# Patient Record
Sex: Female | Born: 1935 | Race: White | Hispanic: No | Marital: Married | Smoking: Current every day smoker
Health system: Southern US, Community
[De-identification: ages and names within clinical notes are randomized; demographics above are authoritative.]

## PROBLEM LIST (undated history)

## (undated) DIAGNOSIS — C8330 Diffuse large B-cell lymphoma, unspecified site: Secondary | ICD-10-CM

## (undated) DIAGNOSIS — C8290 Follicular lymphoma, unspecified, unspecified site: Secondary | ICD-10-CM

## (undated) DIAGNOSIS — B019 Varicella without complication: Secondary | ICD-10-CM

## (undated) DIAGNOSIS — C7931 Secondary malignant neoplasm of brain: Secondary | ICD-10-CM

## (undated) DIAGNOSIS — Z973 Presence of spectacles and contact lenses: Secondary | ICD-10-CM

## (undated) DIAGNOSIS — C4490 Unspecified malignant neoplasm of skin, unspecified: Secondary | ICD-10-CM

## (undated) DIAGNOSIS — C8370 Burkitt lymphoma, unspecified site: Secondary | ICD-10-CM

## (undated) DIAGNOSIS — Z972 Presence of dental prosthetic device (complete) (partial): Secondary | ICD-10-CM

## (undated) DIAGNOSIS — M549 Dorsalgia, unspecified: Secondary | ICD-10-CM

## (undated) DIAGNOSIS — R110 Nausea: Secondary | ICD-10-CM

## (undated) HISTORY — DX: Diffuse large B-cell lymphoma, unspecified site: C83.30

## (undated) HISTORY — PX: APPENDECTOMY: SHX54

## (undated) HISTORY — DX: Follicular lymphoma, unspecified, unspecified site: C82.90

## (undated) HISTORY — DX: Nausea: R11.0

## (undated) HISTORY — DX: Varicella without complication: B01.9

## (undated) HISTORY — PX: TONSILLECTOMY: SUR1361

## (undated) HISTORY — DX: Dorsalgia, unspecified: M54.9

## (undated) HISTORY — DX: Secondary malignant neoplasm of brain: C79.31

---

## 2009-07-18 ENCOUNTER — Ambulatory Visit: Payer: Self-pay | Admitting: Family Medicine

## 2009-07-18 DIAGNOSIS — L02419 Cutaneous abscess of limb, unspecified: Secondary | ICD-10-CM

## 2009-07-18 DIAGNOSIS — L03119 Cellulitis of unspecified part of limb: Secondary | ICD-10-CM

## 2009-07-25 ENCOUNTER — Ambulatory Visit: Payer: Self-pay | Admitting: Family Medicine

## 2009-07-25 DIAGNOSIS — I10 Essential (primary) hypertension: Secondary | ICD-10-CM | POA: Insufficient documentation

## 2009-08-04 ENCOUNTER — Telehealth: Payer: Self-pay | Admitting: Family Medicine

## 2009-08-05 ENCOUNTER — Ambulatory Visit: Payer: Self-pay | Admitting: Family Medicine

## 2009-08-22 ENCOUNTER — Ambulatory Visit: Payer: Self-pay | Admitting: Family Medicine

## 2009-08-22 ENCOUNTER — Ambulatory Visit: Payer: Self-pay

## 2009-08-22 DIAGNOSIS — M79609 Pain in unspecified limb: Secondary | ICD-10-CM

## 2009-08-22 DIAGNOSIS — R609 Edema, unspecified: Secondary | ICD-10-CM

## 2009-09-03 ENCOUNTER — Ambulatory Visit: Payer: Self-pay | Admitting: Family Medicine

## 2010-03-18 ENCOUNTER — Ambulatory Visit: Payer: Self-pay | Admitting: Family Medicine

## 2010-07-09 ENCOUNTER — Ambulatory Visit: Admit: 2010-07-09 | Payer: Self-pay | Admitting: Family Medicine

## 2010-07-28 NOTE — Assessment & Plan Note (Signed)
Summary: 1 WK ROV/NJR   Vital Signs:  Patient profile:   76 year old female Menstrual status:  postmenopausal Temp:     97.5 degrees F oral BP sitting:   158 / 72  (left arm) Cuff size:   regular  Vitals Entered By: Sid Falcon LPN (July 25, 2009 10:04 AM) CC: 1 week left leg cellulitis Is Patient Diabetic? No   History of Present Illness: Here for the following factors.  Cellulitis left leg.  started Keflex one week ago. Overall improved. No fever or chills. Still has some edema but less redness and somewhat less pain. No prior history of cellulitis. No history of clear injury. No side effects from medication  Elevated blood pressure. No history of hypertension treatment. She recalls blood pressure readings 140-150 systolic prior to moving here. No recent headaches. Denies any palpitations, dizziness, or chest pains. No regular alcohol use. No recent nonsteroidal use.  Preventive Screening-Counseling & Management  Alcohol-Tobacco     Smoking Status: quit     Year Started: 1978     Year Quit: 1998  Allergies: 1)  Penicillin V Potassium (Penicillin V Potassium)  Past History:  Past Surgical History: Last updated: 07/18/2009 Appendectomy, age 80 Tonsillectomy, age 21  Social History: Last updated: 07/18/2009 Retired Married Alcohol use-yes Regular exercise-yes Smoked 20 years, quit 1998 2 pregnancies, 2 live births  Social History: Smoking Status:  quit  Review of Systems  The patient denies anorexia, fever, weight loss, weight gain, chest pain, syncope, dyspnea on exertion, prolonged cough, and headaches.    Physical Exam  General:  Well-developed,well-nourished,in no acute distress; alert,appropriate and cooperative throughout examination Neck:  No deformities, masses, or tenderness noted. Lungs:  Normal respiratory effort, chest expands symmetrically. Lungs are clear to auscultation, no crackles or wheezes. Heart:  normal rate, regular rhythm, and no  murmur.   Extremities:  patient still some swelling left lower extremity compared to right and some mild erythema of the distal leg but less tenderness.  Erythema clearly improved c/w last week.   Impression & Recommendations:  Problem # 1:  CELLULITIS, LEG, LEFT (ICD-682.6) Assessment Improved overall improving. Continue heat elevation. Her updated medication list for this problem includes:    Cephalexin 500 Mg Caps (Cephalexin) ..... One by mouth three times a day for 10 days  Problem # 2:  ESSENTIAL HYPERTENSION (ICD-401.9)  patient has had at least 3 readings over 150s systolic. Initiate lisinopril 10 mg daily and followup one month to reassess  Her updated medication list for this problem includes:    Lisinopril 10 Mg Tabs (Lisinopril) ..... One by mouth once daily  Complete Medication List: 1)  Cephalexin 500 Mg Caps (Cephalexin) .... One by mouth three times a day for 10 days 2)  Lisinopril 10 Mg Tabs (Lisinopril) .... One by mouth once daily  Patient Instructions: 1)  Continue frequent elevation of the leg. Continue heating pad several times daily. Call by next week if you've not had further improvement 2)  Please schedule a follow-up appointment in 1 month.  3)  Limit your Sodium(salt) .  Prescriptions: LISINOPRIL 10 MG TABS (LISINOPRIL) one by mouth once daily  #30 x 5   Entered and Authorized by:   Evelena Peat MD   Signed by:   Evelena Peat MD on 07/25/2009   Method used:   [facility] to        [facility]  [address]  [phone]* (retail)       [address] [place]  [place], [place]  [address]       Ph: [phone] or [phone]       Fax: [phone]   RxID:   [id number]

## 2010-07-28 NOTE — Assessment & Plan Note (Signed)
Summary: F/U ON LEG PAIN // RS   Vital Signs:  Patient profile:   76 year old female Menstrual status:  postmenopausal Temp:     98.5 degrees F oral BP sitting:   120 / 68  (left arm) Cuff size:   regular  Vitals Entered By: Sid Falcon LPN (September 03, 1608 10:04 AM) CC: follow-up on leg   History of Present Illness: Followup left lower leg edema. Recently treated for cellulitis. Venous Doppler revealed no evidence for DVT. Last antibiotic was Levaquin and patient did notice some decrease in redness. Denies any fever or chills. Less tender. Still swelling moderately. She has gotten some compression hose that she plans to wear.  Hypertension treated with lisinopril HCTZ. Blood pressure well controlled. Needs refill through mail order.  Allergies: 1)  Penicillin V Potassium (Penicillin V Potassium)  Past History:  Past Medical History: Last updated: 08/05/2009 Chicken pox Hypertension PMH reviewed for relevance  Review of Systems      See HPI  Physical Exam  General:  Well-developed,well-nourished,in no acute distress; alert,appropriate and cooperative throughout examination Lungs:  Normal respiratory effort, chest expands symmetrically. Lungs are clear to auscultation, no crackles or wheezes. Heart:  Normal rate and regular rhythm. S1 and S2 normal without gallop, murmur, click, rub or other extra sounds. Extremities:  left leg reveals some continued edema which is essentially unchanged. Less erythematous. Nontender. Good distal foot pulses. Excellent capillary refill.   Impression & Recommendations:  Problem # 1:  CELLULITIS, LEG, LEFT (ICD-682.6) Assessment Improved agree with compression and she may need prescription compression garments. The following medications were removed from the medication list:    Levaquin 500 Mg Tabs (Levofloxacin) ..... One by mouth once daily for 10 days  Complete Medication List: 1)  Lisinopril-hydrochlorothiazide 10-12.5 Mg Tabs  (Lisinopril-hydrochlorothiazide) .... One by mouth  once daily  Patient Instructions: 1)  followup immediately if you develop any fever or chills or worsening redness of the leg 2)  Please schedule a follow-up appointment in 6 months .  Prescriptions: LISINOPRIL-HYDROCHLOROTHIAZIDE 10-12.5 MG TABS (LISINOPRIL-HYDROCHLOROTHIAZIDE) one by mouth  once daily  #90 x 3   Entered and Authorized by:   Evelena Peat MD   Signed by:   Evelena Peat MD on 09/03/2009   Method used:   Faxed to ...       [facility] (mail-order)             , [place]         Ph: [phone]       Fax: [phone]   RxID:   [id number]

## 2010-07-28 NOTE — Assessment & Plan Note (Signed)
Summary: 6 MTH ROV // RS   Vital Signs:  Patient profile:   76 year old female Menstrual status:  postmenopausal Weight:      112 pounds Temp:     97.7 degrees F oral BP sitting:   120 / 62  (left arm) Cuff size:   regular  Vitals Entered By: Sid Falcon LPN (March 18, 2010 10:30 AM)  History of Present Illness: Patient seen for followup. She has hypertension.  chronic left lower extremity edema during the past year. Initially considered cellulitis but minimal if any improvement with antibiotics. Dopplers revealed no evidence for clot.  Uses support hose inconsistently.  Patient declines flu and Pneumovax.  Hypertension History:      She denies headache, chest pain, palpitations, dyspnea with exertion, orthopnea, PND, visual symptoms, neurologic problems, syncope, and side effects from treatment.  She notes no problems with any antihypertensive medication side effects.        Positive major cardiovascular risk factors include female age 81 years old or older and hypertension.  Negative major cardiovascular risk factors include non-tobacco-user status.     Allergies: 1)  Penicillin V Potassium (Penicillin V Potassium)  Past History:  Past Medical History: Last updated: 08/05/2009 Chicken pox Hypertension PMH reviewed for relevance  Physical Exam  General:  Well-developed,well-nourished,in no acute distress; alert,appropriate and cooperative throughout examination Mouth:  Oral mucosa and oropharynx without lesions or exudates.  Teeth in good repair. Neck:  No deformities, masses, or tenderness noted. Lungs:  Normal respiratory effort, chest expands symmetrically. Lungs are clear to auscultation, no crackles or wheezes. Heart:  Normal rate and regular rhythm. S1 and S2 normal without gallop, murmur, click, rub or other extra sounds. Extremities:  she has very mild erythema left lower leg with very minimal edema and essentially nontender. Palpable dorsalis pedis pulses  bilaterally   Impression & Recommendations:  Problem # 1:  LEG PAIN (ICD-729.5) ?venous stasis.  Problem # 2:  HYPERTENSION (ICD-401.9) Assessment: Unchanged  Her updated medication list for this problem includes:    Lisinopril-hydrochlorothiazide 10-12.5 Mg Tabs (Lisinopril-hydrochlorothiazide) ..... One by mouth  once daily  Problem # 3:  Preventive Health Care (ICD-V70.0) pt offered flu vaccine and declines.  Complete Medication List: 1)  Lisinopril-hydrochlorothiazide 10-12.5 Mg Tabs (Lisinopril-hydrochlorothiazide) .... One by mouth  once daily  Hypertension Assessment/Plan:      The patient's hypertensive risk group is category B: At least one risk factor (excluding diabetes) with no target organ damage.  Today's blood pressure is 120/62.    Patient Instructions: 1)  Consider scheduling medicare wellness exam.

## 2010-07-28 NOTE — Miscellaneous (Signed)
Summary: Orders Update  Clinical Lists Changes  Problems: Added new problem of LEG PAIN (ICD-729.5) Orders: Added new Test order of Venous Duplex Lower Extremity (Venous Duplex Lower) - Signed 

## 2010-07-28 NOTE — Assessment & Plan Note (Signed)
Summary: BRAND NEW PT/TO ESTABLISH/CJR/pt rescd-bad weather//ccm   Vital Signs:  Patient profile:   76 year old female Menstrual status:  postmenopausal Height:      97.6 inches Weight:      117 pounds BMI:     8.67 Temp:     97.6 degrees F oral Pulse rate:   72 / minute Pulse rhythm:   regular Resp:     12 per minute BP sitting:   150 / 78  (left arm) Cuff size:   regular  Vitals Entered By: Sid Falcon LPN (July 18, 2009 10:21 AM) CC: New to establish, new to area     Menstrual Status postmenopausal   History of Present Illness: Patient is seen to establish care.  No chronic medical problems. Takes no medications. Questionable history of penicillin allergy in childhood. No history of anaphylaxis. Previous appendectomy and tonsillectomy.  Family history unrevealing. Takes no medications.  Acute issue is approx 3-4 weeks of some swelling left lower extremity. She recalls hitting her ankle against a box with moving here and had some extreme redness and warmth in the ankle region which has persisted and seems to moving somewhat upper leg. No fevers or chills. No obvious breaks in skin. No thigh pain.    Preventive Screening-Counseling & Management  Alcohol-Tobacco     Year Started: 1978     Year Quit: 1998  Caffeine-Diet-Exercise     Does Patient Exercise: yes  Past History:  Family History: Last updated: 07/18/2009 unrevealing.  Social History: Last updated: 07/18/2009 Retired Married Alcohol use-yes Regular exercise-yes Smoked 20 years, quit 1998 2 pregnancies, 2 live births  Risk Factors: Exercise: yes (07/18/2009)  Past Medical History: Chicken pox  Past Surgical History: Appendectomy, age 30 Tonsillectomy, age 23  Family History: unrevealing.  Social History: Retired Married Alcohol use-yes Regular exercise-yes Smoked 20 years, quit 1998 2 pregnancies, 2 live births Does Patient Exercise:  yes  Review of Systems      See  HPI  Physical Exam  General:  Well-developed,well-nourished,in no acute distress; alert,appropriate and cooperative throughout examination Mouth:  Oral mucosa and oropharynx without lesions or exudates.  Teeth in good repair. Neck:  No deformities, masses, or tenderness noted. Lungs:  Normal respiratory effort, chest expands symmetrically. Lungs are clear to auscultation, no crackles or wheezes. Heart:  normal rate and regular rhythm.   Extremities:  patient has erythema and warmth to palpation left lower leg just above the ankle region. faint erythema spreading about half way up anterior shin. No obvious break in skin noted. No calf tenderness. No thigh tenderness.   Impression & Recommendations:  Problem # 1:  CELLULITIS, LEG, LEFT (ICD-682.6) Assessment New  Her updated medication list for this problem includes:    Cephalexin 500 Mg Caps (Cephalexin) ..... One by mouth three times a day for 10 days  Complete Medication List: 1)  Cephalexin 500 Mg Caps (Cephalexin) .... One by mouth three times a day for 10 days  Patient Instructions: 1)  Schedule followup appointment in one week. 2)  Elevate legs frequently. 3)  Use heating pad several times daily to left calf and ankle region Prescriptions: CEPHALEXIN 500 MG CAPS (CEPHALEXIN) one by mouth three times a day for 10 days  #30 x 0   Entered and Authorized by:   Evelena Peat MD   Signed by:   Evelena Peat MD on 07/18/2009   Method used:   [facility]        [facility]  [address]  [address] (retail)       [address] [place], [place]  [address]       Ph: [phone] or [phone]       Fax: [phone]   RxID:   [id number]

## 2010-07-28 NOTE — Progress Notes (Signed)
Summary: leg pain  Phone Note Call from Patient   Caller: Patient Call For: Colleen Peat MD Summary of Call: Pt calls to let Dr. Caryl Never know that her leg is no better.   [phone] Initial call taken by: Lynann Beaver CMA,  August 04, 2009 11:34 AM  Follow-up for Phone Call        Pt needs f/u to reassess BP anyway so I would get her in over the next day or two to reassess leg. Follow-up by: Colleen Peat MD,  August 04, 2009 12:59 PM  Additional Follow-up for Phone Call Additional follow up Details #1::        Pt. scheduled with Dr. Caryl Never tomorrow. Additional Follow-up by: Lynann Beaver CMA,  August 04, 2009 2:15 PM

## 2010-07-28 NOTE — Assessment & Plan Note (Signed)
Summary: 1 month rov/njr   Vital Signs:  Patient profile:   76 year old female Menstrual status:  postmenopausal Weight:      116.50 pounds Temp:     98.2 degrees F oral BP sitting:   120 / 62  (left arm) Cuff size:   regular  Vitals Entered By: Sid Falcon LPN (August 22, 2009 10:51 AM) CC: 1 month follow-up   History of Present Illness: Patient has persistent left lower any swelling. She presented with typical cellulitis type symptoms of warmth and erythema and some swelling of the left lower extremity one month ago. She did have some improvement initially with Keflex and she has now completed 2 courses of Keflex without resolution of the edema. Redness is slightly improved. Has never had any systemic fever or chills. No history of thrombosis.  ex-smoker.  Allergies: 1)  Penicillin V Potassium (Penicillin V Potassium)  Past History:  Past Medical History: Last updated: 08/05/2009 Chicken pox Hypertension  Social History: Last updated: 07/18/2009 Retired Married Alcohol use-yes Regular exercise-yes Smoked 20 years, quit 1998 2 pregnancies, 2 live births PMH reviewed for relevance, PSH reviewed for relevance  Review of Systems  The patient denies chest pain, syncope, dyspnea on exertion, prolonged cough, and hemoptysis.    Physical Exam  General:  Well-developed,well-nourished,in no acute distress; alert,appropriate and cooperative throughout examination Lungs:  Normal respiratory effort, chest expands symmetrically. Lungs are clear to auscultation, no crackles or wheezes. Heart:  normal rate and regular rhythm.   Extremities:  left lower extremity reveals persistent swelling from the knee down to the ankle region. She has some erythema mostly the lower third lateral leg. Slight warmth. Minimal calf tenderness. No thigh tenderness. Good distal foot pulses.   Impression & Recommendations:  Problem # 1:  LEG EDEMA (ICD-782.3)  this is been treated for  cellulitis without resolution. Obtain Doppler to rule out evidence for DVT. If negative treat with quinolone with Levaquin 500 milligrams daily for 10 days Her updated medication list for this problem includes:    Lisinopril-hydrochlorothiazide 10-12.5 Mg Tabs (Lisinopril-hydrochlorothiazide) ..... One by mouth  once daily  Orders: Doppler Referral (Doppler)  Complete Medication List: 1)  Lisinopril-hydrochlorothiazide 10-12.5 Mg Tabs (Lisinopril-hydrochlorothiazide) .... One by mouth  once daily 2)  Levaquin 500 Mg Tabs (Levofloxacin) .... One by mouth once daily for 10 days Prescriptions: LEVAQUIN 500 MG TABS (LEVOFLOXACIN) one by mouth once daily for 10 days  #10 x 0   Entered and Authorized by:   Evelena Peat MD   Signed by:   Evelena Peat MD on 08/22/2009   Method used:   Print then Give to Patient   RxID:   [id number]   Appended Document: 1 month rov/njr This pt needs office f/u in 2 weeks to reassess leg.  Doppler of leg revealed no DVT.  Appended Document: 1 month rov/njr LMTCB  Appended Document: 1 month rov/njr Called home phone again today, was told wrong number.  Sent a letter to pt home indicating Dr Caryl Never wanted to re-evaluate her leg in 2 weeks, neg DVT, and update phone number.

## 2010-07-28 NOTE — Assessment & Plan Note (Signed)
Summary: recheck leg and BP check/dm   Vital Signs:  Patient profile:   76 year old female Menstrual status:  postmenopausal Temp:     98.7 degrees F oral BP sitting:   160 / 74  (left arm)  Vitals Entered By: Sid Falcon LPN (August 05, 2009 9:18 AM) CC: BP check, F/U cellulitis left leg   History of Present Illness: Here for follow up cellulitis Left Lower Leg and Hypertension. Recent Improvement with Keflex but about 5 Days after Stopping Antibiotics She Had Recurrence of Some Pain Redness and Warmth Left Leg. Allergy Penicillin. On Lisinopril for Hypertension Which she Just Recently Initiated. No Cough or Other Side Effect. Blood Pressure Still Ranging from 150 Systolic by Home Machine.  Allergies: 1)  Penicillin V Potassium (Penicillin V Potassium)  Past History:  Past Medical History: Chicken pox Hypertension PMH reviewed for relevance  Review of Systems      See HPI  Physical Exam  General:  Well-developed,well-nourished,in no acute distress; alert,appropriate and cooperative throughout examination Mouth:  Oral mucosa and oropharynx without lesions or exudates.  Teeth in good repair. Neck:  No deformities, masses, or tenderness noted. Lungs:  Normal respiratory effort, chest expands symmetrically. Lungs are clear to auscultation, no crackles or wheezes. Heart:  normal rate and regular rhythm.   Extremities:   recurrence of edema left lower leg with some erythema and slight warmth and tenderness involving the distal third of the leg. Minimal trace edema right lower extremity. Left distal foot pulses normal. Good capillary refill.  no calf tenderness.   Impression & Recommendations:  Problem # 1:  CELLULITIS, LEG, LEFT (ICD-682.6) recurrence after stopping antibiotic. Start back longer course of Keflex since this seemed to be working previously Her updated medication list for this problem includes:    Cephalexin 500 Mg Caps (Cephalexin) ..... One by mouth three  times a day for 10 days  Problem # 2:  ESSENTIAL HYPERTENSION (ICD-401.9) Assessment: Deteriorated change to lisinopril HCTZ Her updated medication list for this problem includes:    Lisinopril-hydrochlorothiazide 10-12.5 Mg Tabs (Lisinopril-hydrochlorothiazide) ..... One by mouth  once daily  Complete Medication List: 1)  Cephalexin 500 Mg Caps (Cephalexin) .... One by mouth three times a day for 10 days 2)  Lisinopril-hydrochlorothiazide 10-12.5 Mg Tabs (Lisinopril-hydrochlorothiazide) .... One by mouth  once daily  Patient Instructions: 1)  Please schedule a follow-up appointment in 1 month.  2)  Continue frequent elevation of left leg and heating pad 2-3 times per day Prescriptions: CEPHALEXIN 500 MG CAPS (CEPHALEXIN) one by mouth three times a day for 10 days  #42 x 0   Entered and Authorized by:   Evelena Peat MD   Signed by:   Evelena Peat MD on 08/05/2009   Method used:   [facility]        [facility]  [phone]* (retail)       [address] [place], [place]  [address]       Ph: [phone] or [phone]       Fax: [phone]   RxID:   [id number] LISINOPRIL-HYDROCHLOROTHIAZIDE 10-12.5 MG TABS (LISINOPRIL-HYDROCHLOROTHIAZIDE) one by mouth  once daily  #30 x 11   Entered and Authorized by:   Evelena Peat MD   Signed by:   Evelena Peat MD on 08/05/2009   Method used:   [facility]ronically to        [facility]  [address]  [phone]* (retail)  [address] [place], [place]  [address]       Ph: [phone] or [phone]       Fax: [phone]   RxID:   [id number]   Appended Document: Orders Update    Clinical Lists Changes  Orders: Added new Service order of Est. Patient Level III (27253) - Signed

## 2010-08-18 ENCOUNTER — Other Ambulatory Visit: Payer: Self-pay | Admitting: *Deleted

## 2010-08-18 DIAGNOSIS — I10 Essential (primary) hypertension: Secondary | ICD-10-CM

## 2010-08-18 MED ORDER — LISINOPRIL-HYDROCHLOROTHIAZIDE 10-12.5 MG PO TABS: 1.0000 | ORAL_TABLET | Freq: Every day | ORAL | Status: DC

## 2010-10-26 ENCOUNTER — Other Ambulatory Visit: Payer: Self-pay | Admitting: *Deleted

## 2010-10-26 DIAGNOSIS — I10 Essential (primary) hypertension: Secondary | ICD-10-CM

## 2010-10-26 MED ORDER — LISINOPRIL-HYDROCHLOROTHIAZIDE 10-12.5 MG PO TABS: 1.0000 | ORAL_TABLET | Freq: Every day | ORAL | Status: DC

## 2010-10-27 ENCOUNTER — Other Ambulatory Visit: Payer: Self-pay | Admitting: *Deleted

## 2011-01-05 ENCOUNTER — Encounter: Payer: Self-pay | Admitting: Family Medicine

## 2011-01-07 ENCOUNTER — Ambulatory Visit: Payer: Self-pay | Admitting: Family Medicine

## 2011-01-08 ENCOUNTER — Ambulatory Visit (INDEPENDENT_AMBULATORY_CARE_PROVIDER_SITE_OTHER): Payer: 59 | Admitting: Family Medicine

## 2011-01-08 ENCOUNTER — Encounter: Payer: Self-pay | Admitting: Family Medicine

## 2011-01-08 DIAGNOSIS — I10 Essential (primary) hypertension: Secondary | ICD-10-CM

## 2011-01-08 LAB — BASIC METABOLIC PANEL
CO2: 29 mEq/L (ref 19–32)
Calcium: 9.5 mg/dL (ref 8.4–10.5)
Creatinine, Ser: 0.8 mg/dL (ref 0.4–1.2)
Sodium: 135 mEq/L (ref 135–145)

## 2011-01-08 MED ORDER — LISINOPRIL-HYDROCHLOROTHIAZIDE 10-12.5 MG PO TABS: 1.0000 | ORAL_TABLET | Freq: Every day | ORAL | Status: DC

## 2011-01-08 NOTE — Progress Notes (Signed)
  Subjective:    Patient ID: Colleen Cooke, female    DOB: 21-Oct-1934, 76 y.o.   MRN: [id number]  HPI Followup hypertension. Takes lisinopril HCTZ. Compliant with therapy. Minimal leg edema. No headaches or dizziness. Denies chest pains. Denies cough or other side effects. She has some asymmetric chronic edema left leg greater than right. Previous Dopplers negative for DVT. We discussed health maintenance issues such as mammogram and physical and she refuses any of those.   Review of Systems  Constitutional: Negative for fatigue.  Eyes: Negative for visual disturbance.  Respiratory: Negative for cough, chest tightness, shortness of breath and wheezing.   Cardiovascular: Negative for chest pain, palpitations and leg swelling.  Neurological: Negative for dizziness, seizures, syncope, weakness, light-headedness and headaches.       Objective:   Physical Exam  Constitutional: She is oriented to person, place, and time. She appears well-developed and well-nourished.  Neck: Neck supple.  Cardiovascular: Normal rate, regular rhythm and normal heart sounds.  Exam reveals no gallop.   Pulmonary/Chest: Effort normal and breath sounds normal. No respiratory distress. She has no wheezes. She has no rales.  Musculoskeletal:       Trace edema L leg (chronic).  No cellulitis.  Lymphadenopathy:    She has no cervical adenopathy.  Neurological: She is alert and oriented to person, place, and time.  Psychiatric: She has a normal mood and affect. Her behavior is normal.          Assessment & Plan:  #1 hypertension stable refill medication for one year. Check basic metabolic panel. Suggest a complete physical but patient is reluctant.

## 2011-01-11 NOTE — Progress Notes (Signed)
Quick Note:  Pt informed ______ 

## 2011-05-29 DIAGNOSIS — C8290 Follicular lymphoma, unspecified, unspecified site: Secondary | ICD-10-CM

## 2011-05-29 HISTORY — DX: Follicular lymphoma, unspecified, unspecified site: C82.90

## 2011-06-09 ENCOUNTER — Ambulatory Visit (INDEPENDENT_AMBULATORY_CARE_PROVIDER_SITE_OTHER): Payer: 59 | Admitting: Family Medicine

## 2011-06-09 ENCOUNTER — Encounter: Payer: Self-pay | Admitting: Family Medicine

## 2011-06-09 ENCOUNTER — Ambulatory Visit (INDEPENDENT_AMBULATORY_CARE_PROVIDER_SITE_OTHER)
Admission: RE | Admit: 2011-06-09 | Discharge: 2011-06-09 | Disposition: A | Discharge status: Home / Self Care | Payer: 59 | Source: Ambulatory Visit | Attending: Family Medicine | Admitting: Family Medicine

## 2011-06-09 VITALS — BP 110/70 | Temp 98.1°F | Wt 115.0 lb

## 2011-06-09 DIAGNOSIS — R599 Enlarged lymph nodes, unspecified: Secondary | ICD-10-CM

## 2011-06-09 DIAGNOSIS — R591 Generalized enlarged lymph nodes: Secondary | ICD-10-CM

## 2011-06-09 DIAGNOSIS — Z23 Encounter for immunization: Secondary | ICD-10-CM

## 2011-06-09 DIAGNOSIS — R6889 Other general symptoms and signs: Secondary | ICD-10-CM

## 2011-06-09 DIAGNOSIS — I10 Essential (primary) hypertension: Secondary | ICD-10-CM

## 2011-06-09 LAB — CBC WITH DIFFERENTIAL/PLATELET
Basophils Relative: 1 % (ref 0.0–3.0)
Eosinophils Relative: 2 % (ref 0.0–5.0)
HCT: 37.5 % (ref 36.0–46.0)
Hemoglobin: 12.8 g/dL (ref 12.0–15.0)
Lymphs Abs: 1.3 10*3/uL (ref 0.7–4.0)
MCV: 96.7 fl (ref 78.0–100.0)
Monocytes Absolute: 0.6 10*3/uL (ref 0.1–1.0)
Monocytes Relative: 9.2 % (ref 3.0–12.0)
Neutro Abs: 4.7 10*3/uL (ref 1.4–7.7)
Platelets: 217 10*3/uL (ref 150.0–400.0)
RBC: 3.88 Mil/uL (ref 3.87–5.11)
WBC: 6.9 10*3/uL (ref 4.5–10.5)

## 2011-06-09 LAB — HEPATIC FUNCTION PANEL
ALT: 14 U/L (ref 0–35)
AST: 22 U/L (ref 0–37)
Albumin: 4.2 g/dL (ref 3.5–5.2)
Alkaline Phosphatase: 82 U/L (ref 39–117)

## 2011-06-09 LAB — BASIC METABOLIC PANEL
BUN: 17 mg/dL (ref 6–23)
Chloride: 99 mEq/L (ref 96–112)
GFR: 64.18 mL/min (ref >60.00)
Potassium: 4.4 mEq/L (ref 3.5–5.1)
Sodium: 135 mEq/L (ref 135–145)

## 2011-06-09 LAB — SEDIMENTATION RATE: Sed Rate: 17 mm/hr (ref 0–22)

## 2011-06-09 LAB — TSH: TSH: 1.64 u[IU]/mL (ref 0.35–5.50)

## 2011-06-09 NOTE — Progress Notes (Signed)
  Subjective:    Patient ID: Colleen Cooke, female    DOB: 12/27/1934, 76 y.o.   MRN: [id number]  HPI  Patient seen today for the following issues  Has history of hypertension treated with lisinopril HCTZ. Blood pressure very well controlled. She relates that approximately one month ago PA or nurse practitioner from insurance company came out her house to do an assessment.   She was noted to have right cervical adenopathy but we were never notified. Have not received any correspondence from them regarding any home visits. Patient states she's had some progressive right-sided cervical adenopathy over the past month. She denies any recent fever, chills, sore throat, appetite or weight changes, night sweats, fatigue, cough, dyspnea, abdominal pain, or change in urine or bowel habits. She is a former smoker and quit 1995. No dysphagia. Does have frequent cold intolerance. Weight stable.  Denies any other lymphadenopathy. Good appetite. No recent nausea or vomiting.   Review of Systems  Constitutional: Negative for activity change, appetite change, fatigue and unexpected weight change.  HENT: Negative for ear pain, congestion, sore throat, trouble swallowing, voice change and sinus pressure.   Eyes: Negative for visual disturbance.  Respiratory: Negative for cough and shortness of breath.   Cardiovascular: Negative for chest pain.  Genitourinary: Negative for dysuria.  Neurological: Negative for dizziness and headaches.  Hematological: Positive for adenopathy. Does not bruise/bleed easily.  Psychiatric/Behavioral: Negative for confusion.       Objective:   Physical Exam  Constitutional: She is oriented to person, place, and time. She appears well-developed and well-nourished.  HENT:  Right Ear: External ear normal.  Left Ear: External ear normal.  Mouth/Throat: Oropharynx is clear and moist.  Neck: Neck supple.       Patient has significant right anterior cervical lymphadenopathy. Largest  node measures 3 x 5 cm and she has a couple smaller nodes about 2 x 3 cm. Minimal right axillary adenopathy. No supraclavicular adenopathy  Cardiovascular: Normal rate and regular rhythm.   Pulmonary/Chest: Effort normal and breath sounds normal. No respiratory distress. She has no wheezes. She has no rales.  Musculoskeletal: She exhibits edema.       Patient has trace to 1+ pitting edema right leg greater than left.  Lymphadenopathy:    She has cervical adenopathy.  Neurological: She is alert and oriented to person, place, and time.          Assessment & Plan:  #1 unilateral right-sided cervical adenopathy. Worrisome given progression over the past month if not longer and also that this is unilateral. She has no other worrisome symptoms. Need to rule out etiologies such as lymphoma. Start with basic labs and chest x-ray. Likely refer for consideration of lymph node biopsy #2 hypertension stable continue current medication #3 history of cold intolerance. Check TSH

## 2011-06-09 NOTE — Patient Instructions (Signed)
We will call you regarding labs and x-ray.

## 2011-06-10 NOTE — Progress Notes (Signed)
Quick Note:  Pt informed ______ 

## 2011-06-17 ENCOUNTER — Other Ambulatory Visit: Payer: Self-pay | Admitting: Otolaryngology

## 2011-06-24 ENCOUNTER — Telehealth: Payer: Self-pay | Admitting: Oncology

## 2011-06-24 NOTE — Telephone Encounter (Signed)
S/w the pt and she is aware of her new pt appt with dr Gaylyn Rong

## 2011-07-01 ENCOUNTER — Encounter: Payer: Self-pay | Admitting: Oncology

## 2011-07-02 ENCOUNTER — Encounter: Payer: Self-pay | Admitting: Oncology

## 2011-07-02 ENCOUNTER — Telehealth: Payer: Self-pay | Admitting: Oncology

## 2011-07-02 ENCOUNTER — Other Ambulatory Visit: Payer: Self-pay | Admitting: Oncology

## 2011-07-02 ENCOUNTER — Ambulatory Visit: Payer: 59

## 2011-07-02 ENCOUNTER — Other Ambulatory Visit (HOSPITAL_BASED_OUTPATIENT_CLINIC_OR_DEPARTMENT_OTHER): Payer: 59 | Admitting: Lab

## 2011-07-02 ENCOUNTER — Ambulatory Visit: Payer: 59 | Admitting: Oncology

## 2011-07-02 VITALS — BP 109/62 | HR 91 | Temp 97.0°F | Ht 62.5 in | Wt 111.4 lb

## 2011-07-02 DIAGNOSIS — C8330 Diffuse large B-cell lymphoma, unspecified site: Secondary | ICD-10-CM | POA: Insufficient documentation

## 2011-07-02 DIAGNOSIS — C8590 Non-Hodgkin lymphoma, unspecified, unspecified site: Secondary | ICD-10-CM

## 2011-07-02 DIAGNOSIS — Z139 Encounter for screening, unspecified: Secondary | ICD-10-CM

## 2011-07-02 DIAGNOSIS — C8589 Other specified types of non-Hodgkin lymphoma, extranodal and solid organ sites: Secondary | ICD-10-CM

## 2011-07-02 DIAGNOSIS — Z1231 Encounter for screening mammogram for malignant neoplasm of breast: Secondary | ICD-10-CM

## 2011-07-02 LAB — CBC WITH DIFFERENTIAL/PLATELET
BASO%: 0.6 % (ref 0.0–2.0)
Basophils Absolute: 0 10*3/uL (ref 0.0–0.1)
EOS%: 1.9 % (ref 0.0–7.0)
HGB: 12.9 g/dL (ref 11.6–15.9)
MCH: 33.1 pg (ref 25.1–34.0)
MCHC: 34.7 g/dL (ref 31.5–36.0)
MCV: 95.3 fL (ref 79.5–101.0)
MONO%: 6.2 % (ref 0.0–14.0)
RBC: 3.9 10*6/uL (ref 3.70–5.45)
RDW: 13.3 % (ref 11.2–14.5)

## 2011-07-02 NOTE — Progress Notes (Signed)
[facility] MEDICAL ONCOLOGY - INITIAL CONSULATION  Referral MD:  Dr. Narda Bonds, M.D.   Reason for Referral: newly diagnosed follicular lymphoma; staging and IPSS pending.   HPI: Ms. Monrroy is a 77 yo woman.  She recently moved to [place] from [place] to be was a friend with lymphoma.  She was visited by a physician assistant from him on a and was noted that she had right cervical node swelling. She noticed this since October 2012. At first she thought that she had salivary  gland that was swollen.  Ever since the PA point d her she is alert she's been having more lymph node elsewhere especially in the lower level III level for in the right neck. She has not noticed any adenopathy anywhere else.  She was referred to Dr. Narda Bonds who performed on December 20,012 excisional biopsy with pathology case number [id number] which was consistent with low-grade follicular lymphoma. Flow was positive for CD10, CD20, BCL-2 and negative for CD3. She was thus kindly referred to the cancer center for evaluation.   She was here by herself today. She reported that she has noticed some slight enlargement of the right cervical lymph nodes. She denies any palpable adenopathy anywhere else. She denies anorexia, weight loss, headache, fatigue, nausea vomiting, dysphagia, odynophagia, restricted range of motion, chest pain, palpitation, hemoptysis, hematemesis, abdominal pain, early satiety, abdominal swelling, jaundice, hematuria, hematochezia, diarrhea, constipation, low extremity paresthesia, low back pain, bowel bladder incontinence, depression, heat or cold intolerance.    Past Medical History  Diagnosis Date  . Chicken pox   . Hypertension   . Follicular lymphoma 05/2011  :  Past Surgical History  Procedure Date  . Appendectomy     age 73  . Tonsillectomy     age 75  :  Current Outpatient Prescriptions  Medication Sig Dispense Refill  . aspirin 81 MG tablet Take 160  mg by mouth daily.        . Cholecalciferol (VITAMIN D3) 1000 UNITS CAPS Take 1 capsule by mouth daily.        . fish oil-omega-3 fatty acids 1000 MG capsule Take 1 g by mouth daily.        Marland Kitchen lisinopril-hydrochlorothiazide (PRINZIDE,ZESTORETIC) 10-12.5 MG per tablet Take 1 tablet by mouth daily.  90 tablet  3  . naproxen sodium (ANAPROX) 220 MG tablet Take 220 mg by mouth 2 (two) times daily as needed.           Allergies  Allergen Reactions  . Penicillins     REACTION: swelling localized  :  Family History  Problem Relation Age of Onset  . Coronary artery disease Mother 37  :  History   Social History  . Marital Status: Married    Spouse Name: N/A    Number of Children: 2  . Years of Education: N/A   Occupational History  . retired Sports administrator    Social History Main Topics  . Smoking status: Former Smoker -- 1.0 packs/day for 20 years    Types: Cigarettes    Quit date: 01/07/1994  . Smokeless tobacco: Never Used  . Alcohol Use: No  . Drug Use: No  . Sexually Active: Yes    Birth Control/ Protection: None   Other Topics Concern  . Not on file   Social History Narrative  . No narrative on file    Pertinent items are noted in HPI.  Exam:  General:  well-nourished in no acute  distress.  Eyes:  no scleral icterus.  ENT:  There were no oropharyngeal lesions.  Neck was without thyromegaly.  Lymphatics:  Positive for right level II cervical adenopathy measure about 3x3cm; there were shotty right cervical levels III/IV adenoapthy.  There was a large 3x3cm left axillary adenopathy.  There were shotty bilateral inguinal adenopathy.   Respiratory: lungs were clear bilaterally without wheezing or crackles.  Cardiovascular:  Regular rate and rhythm, S1/S2, without murmur, rub or gallop.  There was no pedal edema.  GI:  abdomen was soft, flat, nontender, nondistended, without organomegaly.  Muscoloskeletal:  no spinal tenderness of palpation of vertebral spine.  Skin exam was  without echymosis, petichae.  Neuro exam was nonfocal.  Patient was able to get on and off exam table without assistance.  Gait was normal.  Patient was alerted and oriented.  Attention was good.   Language was appropriate.  Mood was normal without depression.  Speech was not pressured.  Thought content was not tangential.  Bilateral breast exam was negative for any palpable breast mass, or skin lesion.    Lab Results  Component Value Date   WBC 6.5 07/02/2011   HGB 12.9 07/02/2011   HCT 37.1 07/02/2011   PLT 215 07/02/2011   GLUCOSE 89 06/09/2011   ALT 14 06/09/2011   AST 22 06/09/2011   NA 135 06/09/2011   K 4.4 06/09/2011   CL 99 06/09/2011   CREATININE 0.9 06/09/2011   BUN 17 06/09/2011   CO2 27 06/09/2011   TSH 1.64 06/09/2011    Dg Chest 2 View  06/09/2011  *RADIOLOGY REPORT*  Clinical Data: Left cervical adenopathy.  CHEST - 2 VIEW  Comparison: None.  Findings: Small to moderate right pleural effusion.  Mild right lower lobe atelectasis.  Negative for heart failure.  Left lung is clear.  No mass lesion is identified.  COPD with hyperinflation.  IMPRESSION: Isolated right pleural effusion and right lower lobe atelectasis. Consider thoracentesis for evaluation for tumor or infection.  Original Report Authenticated By: Camelia Phenes, M.D.   Assessment and Plan:   1.  HTN:  Well controlled with Lisinopril/HCTZ per PCP. 2.  Borderline HLP: on fishoil. 3.  Newly diagnosed low grade follicular B-cell Non-Hodgkin lymphoma:  Prognosis:  I discussed with the patient that low grade follicular lymphoma do not always need to be treated; however, her disease isprogressing in the neck in addition to the left axilla and possibly in inguinal areas.  I recommended complete staging workup prior to decide the appropriateness of chemotherapy.  I discussed with her that patients with stage I follicular lymphoma may sometime attain cure with radiation therapy.  However patients who have stage III 04 may have  response to chemotherapy however her disease tends recurrent. Therefore we did not treat all patients with lymphoma except for those who have disease progression which is her situation.   Staging: I recommended CT of the neck chest abdomen and pelvis in addition to staging PET scan.  She may have stage III disease and therefore recommended proceeding with a diagnostic bone marrow biopsy to rule out bone marrow involvement of her disease. I discussed with her the pros and cons of bone a biopsy and she expressed informed understanding wished to proceed. She has left axilla adenopathy which is most likely the same process of for lymphoma; however, she has not been very up-to-date with her breast cancer screening. Therefore I referred her to screening mammogram to rule out any possibility of breast  cancer with axilla metastases disease.  Treatment: I believe discussed with Ms. Luft that patients with follicular lymphoma require treatment normal receive chemotherapy in the forms of bendamustine/Rituxan which are rather well tolerated. She said that she is not worried about treatment and whichever she requires to do she will go ahead with that.  Followup: I will see you myself on 07/13/2011 to go the staging workup and going to more details about chemotherapy at that time.  The length of time of the face-to-face encounter was 45 minutes. More than 50% of time was spent counseling and coordination of care.

## 2011-07-02 NOTE — Telephone Encounter (Signed)
called pt with mammo appt on 1/7,lab bm bx on 1/8,ct/pet on 1/10 and md visit on 1/15.

## 2011-07-05 ENCOUNTER — Ambulatory Visit
Admission: RE | Admit: 2011-07-05 | Discharge: 2011-07-05 | Disposition: A | Discharge status: Home / Self Care | Payer: 59 | Source: Ambulatory Visit | Attending: Oncology | Admitting: Oncology

## 2011-07-05 ENCOUNTER — Encounter: Payer: 59 | Admitting: Oncology

## 2011-07-05 DIAGNOSIS — Z1231 Encounter for screening mammogram for malignant neoplasm of breast: Secondary | ICD-10-CM

## 2011-07-05 LAB — BETA 2 MICROGLOBULIN, SERUM: Beta-2 Microglobulin: 2.42 mg/L — ABNORMAL HIGH (ref 1.01–1.73)

## 2011-07-06 ENCOUNTER — Ambulatory Visit (HOSPITAL_BASED_OUTPATIENT_CLINIC_OR_DEPARTMENT_OTHER): Payer: Medicare HMO | Admitting: Oncology

## 2011-07-06 ENCOUNTER — Ambulatory Visit: Payer: 59

## 2011-07-06 ENCOUNTER — Other Ambulatory Visit: Payer: Self-pay | Admitting: Oncology

## 2011-07-06 ENCOUNTER — Other Ambulatory Visit (HOSPITAL_COMMUNITY)
Admission: RE | Admit: 2011-07-06 | Discharge: 2011-07-06 | Disposition: A | Discharge status: Home / Self Care | Payer: Medicare HMO | Source: Ambulatory Visit | Attending: Oncology | Admitting: Oncology

## 2011-07-06 ENCOUNTER — Other Ambulatory Visit: Payer: Self-pay

## 2011-07-06 ENCOUNTER — Other Ambulatory Visit: Payer: 59 | Admitting: Lab

## 2011-07-06 DIAGNOSIS — C8589 Other specified types of non-Hodgkin lymphoma, extranodal and solid organ sites: Secondary | ICD-10-CM

## 2011-07-06 DIAGNOSIS — C8299 Follicular lymphoma, unspecified, extranodal and solid organ sites: Secondary | ICD-10-CM | POA: Insufficient documentation

## 2011-07-06 NOTE — Patient Instructions (Signed)
Pt discharged to home.  RN traced quarter-sized blood stain on dressing and instructed pt to monitor site.  If bleeding continues, pt is to apply pressure.  If pt cannot get bleeding to stop she is to call office.  Pt verbalized understanding.  Pt to call for questions or concerns.  shk

## 2011-07-06 NOTE — Progress Notes (Signed)
Bone marrow biopsy and aspiration.  INDICATION:  Procedure: After obtained from consent, Lizet Kelso was placed in the prone position. Time out was performed verifying correct patient and procedure. The skin overlying the left posterior crest was prepped with Betadine and draped in the usual sterile fashion. The skin and periosteum were infiltrated with 10 mL of 2% lidocaine x 2 as she was still uncomfortable with the first 10 mL. A small puncture wound was made with #11 scalpel blade.  Bone marrow aspirate was obtained on the first pass of the aspiration needle.  One core biopsy was obtained through the same incision.   The aspirate was sent for routine histology, flow cytometry, and cytogenetics.  Core biopsy was sent for routine histology.   Renaye Rakers Reist tolerated procedure well with minimal  blood loss and without immediate complication.   A sterile dressing was applied.   Jethro Bolus M.D. 07/06/2011

## 2011-07-08 ENCOUNTER — Encounter (HOSPITAL_COMMUNITY)
Admission: RE | Admit: 2011-07-08 | Discharge: 2011-07-08 | Disposition: A | Discharge status: Home / Self Care | Payer: Medicare HMO | Source: Ambulatory Visit | Attending: Oncology | Admitting: Oncology

## 2011-07-08 DIAGNOSIS — C8590 Non-Hodgkin lymphoma, unspecified, unspecified site: Secondary | ICD-10-CM

## 2011-07-08 DIAGNOSIS — C8299 Follicular lymphoma, unspecified, extranodal and solid organ sites: Secondary | ICD-10-CM | POA: Insufficient documentation

## 2011-07-08 DIAGNOSIS — R599 Enlarged lymph nodes, unspecified: Secondary | ICD-10-CM | POA: Insufficient documentation

## 2011-07-08 DIAGNOSIS — J90 Pleural effusion, not elsewhere classified: Secondary | ICD-10-CM | POA: Insufficient documentation

## 2011-07-08 DIAGNOSIS — I700 Atherosclerosis of aorta: Secondary | ICD-10-CM | POA: Insufficient documentation

## 2011-07-08 MED ORDER — IOHEXOL 300 MG/ML  SOLN
100.0000 mL | Freq: Once | INTRAMUSCULAR | Status: AC | PRN
  Administered 2011-07-08: 100 mL via INTRAVENOUS

## 2011-07-08 MED ORDER — FLUDEOXYGLUCOSE F - 18 (FDG) INJECTION
17.6000 | Freq: Once | INTRAVENOUS | Status: AC | PRN
  Administered 2011-07-08: 17.6 via INTRAVENOUS

## 2011-07-13 ENCOUNTER — Ambulatory Visit: Payer: 59 | Admitting: Family Medicine

## 2011-07-13 ENCOUNTER — Ambulatory Visit (HOSPITAL_BASED_OUTPATIENT_CLINIC_OR_DEPARTMENT_OTHER): Payer: Medicare HMO | Admitting: Oncology

## 2011-07-13 ENCOUNTER — Encounter: Payer: Self-pay | Admitting: Oncology

## 2011-07-13 VITALS — BP 128/61 | HR 79 | Temp 97.0°F | Ht 62.5 in | Wt 114.0 lb

## 2011-07-13 DIAGNOSIS — C8299 Follicular lymphoma, unspecified, extranodal and solid organ sites: Secondary | ICD-10-CM

## 2011-07-13 DIAGNOSIS — C8590 Non-Hodgkin lymphoma, unspecified, unspecified site: Secondary | ICD-10-CM

## 2011-07-13 DIAGNOSIS — I10 Essential (primary) hypertension: Secondary | ICD-10-CM

## 2011-07-13 MED ORDER — ACYCLOVIR 400 MG PO TABS: 400.0000 mg | ORAL_TABLET | Freq: Every day | ORAL | Status: DC

## 2011-07-13 MED ORDER — ALLOPURINOL 300 MG PO TABS: 300.0000 mg | ORAL_TABLET | Freq: Two times a day (BID) | ORAL | Status: DC

## 2011-07-13 MED ORDER — ONDANSETRON HCL 8 MG PO TABS: ORAL_TABLET | ORAL | Status: DC

## 2011-07-13 MED ORDER — PROCHLORPERAZINE MALEATE 10 MG PO TABS: 10.0000 mg | ORAL_TABLET | Freq: Four times a day (QID) | ORAL | Status: DC | PRN

## 2011-07-13 NOTE — Progress Notes (Signed)
[facility] OFFICE PROGRESS NOTE  Cc:  [name], MD  DIAGNOSIS:  Stage IV, low grade follicular lymphoma; FLIPI score of 3 or high risk (due to age >66; stage IV; and more than 4 areas of involved nodal areas).  Presenting beta-2 microglobulin of 2.42; LDH of 186.    CURRENT THERAPY: here to finalize treatment options today.   INTERVAL HISTORY: Colleen Cooke 77 y.o. female returns to discuss her work up. Her husband again waited for her in the waiting.  Despite my offer to have him sit in on our conversation, she declined to invite him in.   She still has right neck cervical node swelling.  She is still very functional without fatigue.  She does not notice other sites of adenopathy.  Patient denies fatigue, headache, visual changes, confusion, drenching night sweats, mucositis, odynophagia, dysphagia, nausea vomiting, jaundice, chest pain, palpitation, shortness of breath, dyspnea on exertion, productive cough, gum bleeding, epistaxis, hematemesis, hemoptysis, abdominal pain, abdominal swelling, early satiety, melena, hematochezia, hematuria, skin rash, spontaneous bleeding, joint swelling, joint pain, heat or cold intolerance, bowel bladder incontinence, back pain, focal motor weakness, paresthesia, depression, suicidal or homocidal ideation, feeling hopelessness.   MEDICAL HISTORY: Past Medical History  Diagnosis Date  . Chicken pox   . Hypertension   . Follicular lymphoma 05/2011    SURGICAL HISTORY:  Past Surgical History  Procedure Date  . Appendectomy     age 58  . Tonsillectomy     age 52    MEDICATIONS: Current Outpatient Prescriptions  Medication Sig Dispense Refill  . aspirin 81 MG tablet Take 160 mg by mouth daily.        . Cholecalciferol (VITAMIN D3) 1000 UNITS CAPS Take 1 capsule by mouth daily.        . fish oil-omega-3 fatty acids 1000 MG capsule Take 1 g by mouth daily.        Marland Kitchen lisinopril-hydrochlorothiazide (PRINZIDE,ZESTORETIC) 10-12.5  MG per tablet Take 1 tablet by mouth daily.  90 tablet  3  . naproxen sodium (ANAPROX) 220 MG tablet Take 220 mg by mouth 2 (two) times daily as needed.        Marland Kitchen acyclovir (ZOVIRAX) 400 MG tablet Take 1 tablet (400 mg total) by mouth daily.  30 tablet  3  . allopurinol (ZYLOPRIM) 300 MG tablet Take 1 tablet (300 mg total) by mouth 2 (two) times daily.  60 tablet  0  . ondansetron (ZOFRAN) 8 MG tablet Take 1 tablet two times a day starting the day after chemo for 2 days. Then take 1 tablet two times a day as needed for nausea or vomiting.  30 tablet  1  . prochlorperazine (COMPAZINE) 10 MG tablet Take 1 tablet (10 mg total) by mouth every 6 (six) hours as needed (Nausea or vomiting).  30 tablet  1    ALLERGIES:  is allergic to penicillins.  REVIEW OF SYSTEMS:  The rest of the 14-point review of system was negative.   Filed Vitals:   07/13/11 1128  BP: 128/61  Pulse: 79  Temp: 97 F (36.1 C)   Wt Readings from Last 3 Encounters:  07/13/11 114 lb (51.71 kg)  07/02/11 111 lb 6.4 oz (50.531 kg)  06/09/11 115 lb (52.164 kg)   ECOG Performance status: 0  PHYSICAL EXAMINATION:   General:  Thin-appearing woman in no acute distress.  Eyes:  no scleral icterus.  ENT:  There were no oropharyngeal lesions.  Neck was without thyromegaly.  Lymphatics:  Positive for right cervical adenopathy about 3x5cm; left axillary adnopathy about 3x3cm; right inguinal adenopathy about 3x5cm.  Respiratory: lungs were clear bilaterally without wheezing or crackles.  Cardiovascular:  Regular rate and rhythm, S1/S2, without murmur, rub or gallop.  There was no pedal edema.  GI:  abdomen was soft, flat, nontender, nondistended, without organomegaly.  Muscoloskeletal:  no spinal tenderness of palpation of vertebral spine.  Skin exam was without echymosis, petichae.  Neuro exam was nonfocal.  Patient was able to get on and off exam table without assistance.  Gait was normal.  Patient was alerted and oriented.  Attention was  good.   Language was appropriate.  Mood was normal without depression.  Speech was not pressured.  Thought content was not tangential.         LABORATORY/RADIOLOGY DATA:  Lab Results  Component Value Date   WBC 6.5 07/02/2011   HGB 12.9 07/02/2011   HCT 37.1 07/02/2011   PLT 215 07/02/2011   GLUCOSE 89 06/09/2011   ALT 14 06/09/2011   AST 22 06/09/2011   NA 135 06/09/2011   K 4.4 06/09/2011   CL 99 06/09/2011   CREATININE 0.9 06/09/2011   BUN 17 06/09/2011   CO2 27 06/09/2011   TSH 1.64 06/09/2011   IMAGING:  I personally reviewed the following CT and PET scan.  I showed patient the PET scan images.  In brief, there was extensive involvement in right cervical, bilateral axillary, right inguinal, mediastinal, pleural effusion.   Ct Soft Tissue Neck W Contrast  07/08/2011  *RADIOLOGY REPORT*  Clinical Data:  Newly diagnosed follicular lymphoma  CT NECK, CHEST, ABDOMEN AND PELVIS WITH CONTRAST  Technique:  Multidetector CT imaging of the neck, chest, abdomen and pelvis was performed using the standard protocol following the bolus administration of intravenous contrast.  Contrast: OMNIPAQUE IOHEXOL 300 MG/ML IV SOLN  Comparison:  None  CT NECK  Findings:  Multiple enlarged right neck lymph nodes, including: --1.5 x 1.2 cm right level IIb node (series 4/image 25) --1.8 x 1.8 cm right infraparotid/level IIa node (series 4/image 28) --1.3 x 1.6 cm right level III node (series 4/image 50) --1.3 x 3.6 cm aggregate right level IV nodal soft tissue (series 4/image 59) --2.2 x 1.4 cm right supraclavicular node (series 4/image 59)  Airway is patent.  Visualized brain parenchyma is unremarkable.  Visualized thyroid is within normal limits.  Mild degenerative changes of the cervical spine.  IMPRESSION: Multiple enlarged right neck cervical and supraclavicular lymph nodes, as described above.  CT CHEST  Findings: Moderate to large right pleural effusion.  Associated atelectasis.  Mild centrilobular  emphysematous changes.  No suspicious pulmonary nodules.  No pneumothorax.  The heart is normal in size.  No pericardial effusion.  Mild coronary atherosclerosis.  Atherosclerotic calcifications of the aortic arch.  Mediastinal/axillary lymphadenopathy, including: --2.3 x 2.0 cm right axillary node (series 2/image 13) --1.8 x 2.2 cm right level VII node (series 2/image 16) --2.0 x 1.7 cm left axillary node (series 2/image 19) --1.5 x 2.2 cm precarinal node (series 2/image 24)  Visualized osseous structures are within normal limits.  IMPRESSION:  Mediastinal/bilateral axillary lymphadenopathy, as described above.  Moderate to large right pleural effusion with associated atelectasis.  Mild centrilobular emphysematous changes.  No suspicious pulmonary nodules.  CT ABDOMEN AND PELVIS  Findings: Liver, spleen, pancreas, and adrenal glands are within normal limits.  Gallbladder is unremarkable.  No intrahepatic or extrahepatic ductal dilatation.  Right kidney is malrotated.  Left kidney  is unremarkable.  No hydronephrosis.  No evidence of bowel obstruction.  Atherosclerotic calcifications of the abdominal aorta and branch vessels.  No abdominopelvic ascites.  Retroperitoneal/right pelvic lymphadenopathy, including: --1.3 x 2.1 cm left para-aortic node (series 2/image 68) --3.3 x 2.0 cm abnormal right pelvic sidewall nodal tissue (series 2/image 96) --2.1 x 3.3 cm abnormal right deep inguinal nodal tissue (series 2/image 97) --3.1 x 2.7 cm right inguinal node (series 2/image 107)  Uterus is unremarkable.  3.1 x 2.6 cm soft tissue lesion in the right adnexa (series 2/image 98), non-FDG-avid on concurrent PET, of uncertain/dubious significance.  Bladder is mildly thick-walled anteriorly.  Mild degenerative changes of the lumbar spine with mild superior endplate compression at L4.  IMPRESSION: Retroperitoneal/right pelvic lymphadenopathy, as described above.  3.1 cm right adnexal soft tissue lesion, non-FDG-avid on concurrent  PET, of uncertain/dubious significance.  Original Report Authenticated By: Charline Bills, M.D.   Ct Chest W Contrast  07/08/2011  *RADIOLOGY REPORT*  Clinical Data:  Newly diagnosed follicular lymphoma  CT NECK, CHEST, ABDOMEN AND PELVIS WITH CONTRAST  Technique:  Multidetector CT imaging of the neck, chest, abdomen and pelvis was performed using the standard protocol following the bolus administration of intravenous contrast.  Contrast: OMNIPAQUE IOHEXOL 300 MG/ML IV SOLN  Comparison:  None  CT NECK  Findings:  Multiple enlarged right neck lymph nodes, including: --1.5 x 1.2 cm right level IIb node (series 4/image 25) --1.8 x 1.8 cm right infraparotid/level IIa node (series 4/image 28) --1.3 x 1.6 cm right level III node (series 4/image 50) --1.3 x 3.6 cm aggregate right level IV nodal soft tissue (series 4/image 59) --2.2 x 1.4 cm right supraclavicular node (series 4/image 59)  Airway is patent.  Visualized brain parenchyma is unremarkable.  Visualized thyroid is within normal limits.  Mild degenerative changes of the cervical spine.  IMPRESSION: Multiple enlarged right neck cervical and supraclavicular lymph nodes, as described above.  CT CHEST  Findings: Moderate to large right pleural effusion.  Associated atelectasis.  Mild centrilobular emphysematous changes.  No suspicious pulmonary nodules.  No pneumothorax.  The heart is normal in size.  No pericardial effusion.  Mild coronary atherosclerosis.  Atherosclerotic calcifications of the aortic arch.  Mediastinal/axillary lymphadenopathy, including: --2.3 x 2.0 cm right axillary node (series 2/image 13) --1.8 x 2.2 cm right level VII node (series 2/image 16) --2.0 x 1.7 cm left axillary node (series 2/image 19) --1.5 x 2.2 cm precarinal node (series 2/image 24)  Visualized osseous structures are within normal limits.  IMPRESSION:  Mediastinal/bilateral axillary lymphadenopathy, as described above.  Moderate to large right pleural effusion with  associated atelectasis.  Mild centrilobular emphysematous changes.  No suspicious pulmonary nodules.  CT ABDOMEN AND PELVIS  Findings: Liver, spleen, pancreas, and adrenal glands are within normal limits.  Gallbladder is unremarkable.  No intrahepatic or extrahepatic ductal dilatation.  Right kidney is malrotated.  Left kidney is unremarkable.  No hydronephrosis.  No evidence of bowel obstruction.  Atherosclerotic calcifications of the abdominal aorta and branch vessels.  No abdominopelvic ascites.  Retroperitoneal/right pelvic lymphadenopathy, including: --1.3 x 2.1 cm left para-aortic node (series 2/image 68) --3.3 x 2.0 cm abnormal right pelvic sidewall nodal tissue (series 2/image 96) --2.1 x 3.3 cm abnormal right deep inguinal nodal tissue (series 2/image 97) --3.1 x 2.7 cm right inguinal node (series 2/image 107)  Uterus is unremarkable.  3.1 x 2.6 cm soft tissue lesion in the right adnexa (series 2/image 98), non-FDG-avid on concurrent PET, of uncertain/dubious significance.  Bladder is mildly thick-walled anteriorly.  Mild degenerative changes of the lumbar spine with mild superior endplate compression at L4.  IMPRESSION: Retroperitoneal/right pelvic lymphadenopathy, as described above.  3.1 cm right adnexal soft tissue lesion, non-FDG-avid on concurrent PET, of uncertain/dubious significance.  Original Report Authenticated By: Charline Bills, M.D.   Ct Abdomen Pelvis W Contrast  07/08/2011  *RADIOLOGY REPORT*  Clinical Data:  Newly diagnosed follicular lymphoma  CT NECK, CHEST, ABDOMEN AND PELVIS WITH CONTRAST  Technique:  Multidetector CT imaging of the neck, chest, abdomen and pelvis was performed using the standard protocol following the bolus administration of intravenous contrast.  Contrast: OMNIPAQUE IOHEXOL 300 MG/ML IV SOLN  Comparison:  None  CT NECK  Findings:  Multiple enlarged right neck lymph nodes, including: --1.5 x 1.2 cm right level IIb node (series 4/image 25) --1.8 x 1.8 cm  right infraparotid/level IIa node (series 4/image 28) --1.3 x 1.6 cm right level III node (series 4/image 50) --1.3 x 3.6 cm aggregate right level IV nodal soft tissue (series 4/image 59) --2.2 x 1.4 cm right supraclavicular node (series 4/image 59)  Airway is patent.  Visualized brain parenchyma is unremarkable.  Visualized thyroid is within normal limits.  Mild degenerative changes of the cervical spine.  IMPRESSION: Multiple enlarged right neck cervical and supraclavicular lymph nodes, as described above.  CT CHEST  Findings: Moderate to large right pleural effusion.  Associated atelectasis.  Mild centrilobular emphysematous changes.  No suspicious pulmonary nodules.  No pneumothorax.  The heart is normal in size.  No pericardial effusion.  Mild coronary atherosclerosis.  Atherosclerotic calcifications of the aortic arch.  Mediastinal/axillary lymphadenopathy, including: --2.3 x 2.0 cm right axillary node (series 2/image 13) --1.8 x 2.2 cm right level VII node (series 2/image 16) --2.0 x 1.7 cm left axillary node (series 2/image 19) --1.5 x 2.2 cm precarinal node (series 2/image 24)  Visualized osseous structures are within normal limits.  IMPRESSION:  Mediastinal/bilateral axillary lymphadenopathy, as described above.  Moderate to large right pleural effusion with associated atelectasis.  Mild centrilobular emphysematous changes.  No suspicious pulmonary nodules.  CT ABDOMEN AND PELVIS  Findings: Liver, spleen, pancreas, and adrenal glands are within normal limits.  Gallbladder is unremarkable.  No intrahepatic or extrahepatic ductal dilatation.  Right kidney is malrotated.  Left kidney is unremarkable.  No hydronephrosis.  No evidence of bowel obstruction.  Atherosclerotic calcifications of the abdominal aorta and branch vessels.  No abdominopelvic ascites.  Retroperitoneal/right pelvic lymphadenopathy, including: --1.3 x 2.1 cm left para-aortic node (series 2/image 68) --3.3 x 2.0 cm abnormal right pelvic  sidewall nodal tissue (series 2/image 96) --2.1 x 3.3 cm abnormal right deep inguinal nodal tissue (series 2/image 97) --3.1 x 2.7 cm right inguinal node (series 2/image 107)  Uterus is unremarkable.  3.1 x 2.6 cm soft tissue lesion in the right adnexa (series 2/image 98), non-FDG-avid on concurrent PET, of uncertain/dubious significance.  Bladder is mildly thick-walled anteriorly.  Mild degenerative changes of the lumbar spine with mild superior endplate compression at L4.  IMPRESSION: Retroperitoneal/right pelvic lymphadenopathy, as described above.  3.1 cm right adnexal soft tissue lesion, non-FDG-avid on concurrent PET, of uncertain/dubious significance.  Original Report Authenticated By: Charline Bills, M.D.   Nm Pet Image Initial (pi) Skull Base To Thigh  07/08/2011  *RADIOLOGY REPORT*  Clinical Data:  Initial treatment strategy for follicular lymphoma.  NUCLEAR MEDICINE PET CT INITIAL (PI) SKULL BASE TO THIGH  Technique:  17.6 mCi F-18 FDG was injected intravenously via the left  antecubital fossa.  Full-ring PET imaging was performed from the skull base through the mid-thighs 60  minutes after injection. CT data was obtained and used for attenuation correction and anatomic localization only.  (This was not acquired as a diagnostic CT examination.)  Fasting Blood Glucose:  93  Patient Weight:  111 pounds.  Comparison:  None.  Findings: Hypermetabolic right neck lymph nodes, including: --right level II nodes with max SUV 16.4 --right level IV nodes with max SUV 10.8  Hypermetabolic nodes in the chest, including: --right axillary nodes with max SUV 5.9 --left axillary nodes with max SUV 5.4 --right level VII nodes with max SUV 10.1  Hypermetabolic abdominopelvic lymphadenopathy, including: --para-aortic lymphadenopathy with max SUV 4.0 --right external iliac node with max SUV 8.0 --right inguinal node with max SUV 5.8  Additional findings: --moderate-to-large right pleural effusion. --atherosclerotic  calcifications of the aorta and branch vessels --2.7 x 3.7 cm right adnexal soft tissue mass (series 2/image 195), non-FDG-avid  IMPRESSION: Hypermetabolic lymphadenopathy in the neck, chest, abdomen, and pelvis, as described above.  Right neck lymph nodes with max SUV 16.4.  Bilateral axillary nodes with max SUV 5.9.  Right external iliac/inguinal nodes with max SUV 8.0.  Original Report Authenticated By: Charline Bills, M.D.    ASSESSMENT AND PLAN:   1.  Pleural effusion:  Differential diagnosis: most likely due to her extensive lymphoma involvement.  However, I cannot rule out other processes such as inflammatory, concurrent cancer such as bronchogenic.  Less likely due to empyema as she has no fever or pain. I recommended diagnostic US-guided thoracentesis.  However, she has had many work up lately. She preferred to proceed with treatment; and if the effusion does not improve, she may proceed with diagnostic thoracentesis at a later date.  2.  State IV, low grade, but high risk follicular lymphoma:  PROGNOSIS:  Given the high risk nature of her lymphoma, I strongly recommended chemotherapy.  I discussed with her that there is no indication for bone marrow transplant in follicular lymphoma upfront.  There chance of response with chemo can be up to 80%; however, there is chance of relapse in the future.    TREATMENT:  Given her age, I recommended regimen of Bendamustine/Rituxan (BR) over R-CHOP or R-CVP given recent date from [place] showing comparable if not better response rate and lower risk of side effects with BR compared to R-CHOP.  Since her disease is palpable, I would not obtain mid therapy restaging unless there is concern for refractory or progression.  I will obtain restaging after 6 cycles of chemo. I discussed with her that if she has partial response or better with induction chemo, then I would strongly recommend 2 years of maintenance Rituxan once every 2 months to increase rate of  progression free survival (in other words, prolonging her remission).  SIDE EFFECTS:  This chemotherapy regimen has side effects which include but not limited to hair thinning, fatigue, mucositis, nausea/vomiting, skin rash, diarrhea, infusion reaction, very low risk of seizure/neuro changes (PRES syndrome); reactivation of hepatitis B.    She expressed informed understanding and wished to proceed with chemo.    PREPARATION:  She was referred to chemo class within one week.  She was given prescription for compazine/zofran prn.  She was given Allopurinol to start when she starts chemo to decrease the risk of tymor lysis syndrome given how extensive her disease was.  She was given Acyclovir to decrease the risk of reactive of herpes.    Follow  up: She is tentatively set up to start chemo on 07/21/11, 07/22/11; and Neulasta on 07/23/11 to decrease the risk of infection.  I will see her on 08/18/11 to start the 2nd cycle of chemo.  I will also check CMET about 1 wk post the first cycle of chemo to ensure that she does not have tumor lysis syndrome.   3.  HTN:  Well controlled on Lisinopril/HCTZ per PCP.  I advised her not to take blood pressure med the days she is here for chemo to decrease the risk of hypotension with chemo.  4.  Family support:  Like last visit, and again this visit, even though her husband was here, she did not want him in the room. Her step children are PA and nurses.  I offered to call them to keep them abreast with her treatment.  She thanked me for the offer but did not want me to do so at this time.  I contacted social work to ensure that she is having adequate support at home.    The length of time of the face-to-face encounter was 40 minutes. More than 50% of time was spent counseling and coordination of care.

## 2011-07-14 ENCOUNTER — Encounter: Payer: Self-pay | Admitting: *Deleted

## 2011-07-14 ENCOUNTER — Telehealth: Payer: Self-pay | Admitting: Oncology

## 2011-07-14 ENCOUNTER — Other Ambulatory Visit: Payer: Medicare HMO

## 2011-07-14 NOTE — Telephone Encounter (Signed)
called pt and informed her of lab on 1-28

## 2011-07-14 NOTE — Progress Notes (Signed)
[facility] Brief Psychosocial Assessment Clinical Social Work  Clinical Social Work was referred by Systems developer for assessment of psychosocial needs.  Clinical Social Worker contacted patient at home to offer support and assess for needs.    Patient identified no barriers to care at this time.  [name] reports that she attended chemo class this morning and felt that it was extremely well informative and answered any questions she may have had regarding her treatment.  The patient expressed a positive outlook regarding her diagnosis but states she understands the seriousness of her disease.  The patient indicated that her family is very supportive and many of her children are in the medical field which has been beneficial in better understanding her diagnosis.  The patient indicated no needs at this time and appears to be coping adequately with her diagnosis.  She plans to begin chemo treatment next week. Clinical Social Worker encouraged patient to contact her for emotional support if any needs should arise for her or her family. Kathrin Penner, MSW, [facility] Clinical Social Worker [facility] [phone]

## 2011-07-21 ENCOUNTER — Other Ambulatory Visit: Payer: Self-pay | Admitting: Oncology

## 2011-07-21 ENCOUNTER — Ambulatory Visit (HOSPITAL_BASED_OUTPATIENT_CLINIC_OR_DEPARTMENT_OTHER): Payer: Medicare HMO

## 2011-07-21 VITALS — BP 105/59 | HR 97 | Temp 97.0°F

## 2011-07-21 DIAGNOSIS — C8589 Other specified types of non-Hodgkin lymphoma, extranodal and solid organ sites: Secondary | ICD-10-CM

## 2011-07-21 DIAGNOSIS — C8590 Non-Hodgkin lymphoma, unspecified, unspecified site: Secondary | ICD-10-CM

## 2011-07-21 DIAGNOSIS — Z5111 Encounter for antineoplastic chemotherapy: Secondary | ICD-10-CM

## 2011-07-21 MED ORDER — SODIUM CHLORIDE 0.9 % IV SOLN
100.0000 mg/m2 | Freq: Once | INTRAVENOUS | Status: AC
  Administered 2011-07-21: 150 mg via INTRAVENOUS
  Filled 2011-07-21: qty 30

## 2011-07-21 MED ORDER — SODIUM CHLORIDE 0.9 % IV SOLN
375.0000 mg/m2 | Freq: Once | INTRAVENOUS | Status: AC
  Administered 2011-07-21: 600 mg via INTRAVENOUS
  Filled 2011-07-21: qty 60

## 2011-07-21 MED ORDER — ACETAMINOPHEN 325 MG PO TABS
650.0000 mg | ORAL_TABLET | Freq: Once | ORAL | Status: AC
  Administered 2011-07-21: 650 mg via ORAL

## 2011-07-21 MED ORDER — SODIUM CHLORIDE 0.9 % IV SOLN
Freq: Once | INTRAVENOUS | Status: AC
  Administered 2011-07-21: 10:00:00 via INTRAVENOUS

## 2011-07-21 MED ORDER — ONDANSETRON 8 MG/50ML IVPB (CHCC)
8.0000 mg | Freq: Once | INTRAVENOUS | Status: AC
  Administered 2011-07-21: 8 mg via INTRAVENOUS

## 2011-07-21 MED ORDER — DEXAMETHASONE SODIUM PHOSPHATE 10 MG/ML IJ SOLN
10.0000 mg | Freq: Once | INTRAMUSCULAR | Status: AC
  Administered 2011-07-21: 10 mg via INTRAVENOUS

## 2011-07-21 MED ORDER — DIPHENHYDRAMINE HCL 25 MG PO CAPS
50.0000 mg | ORAL_CAPSULE | Freq: Once | ORAL | Status: AC
  Administered 2011-07-21: 50 mg via ORAL

## 2011-07-22 ENCOUNTER — Ambulatory Visit (HOSPITAL_BASED_OUTPATIENT_CLINIC_OR_DEPARTMENT_OTHER): Payer: Medicare HMO

## 2011-07-22 VITALS — BP 113/53 | HR 85 | Temp 96.9°F

## 2011-07-22 DIAGNOSIS — Z5111 Encounter for antineoplastic chemotherapy: Secondary | ICD-10-CM

## 2011-07-22 DIAGNOSIS — C8590 Non-Hodgkin lymphoma, unspecified, unspecified site: Secondary | ICD-10-CM

## 2011-07-22 DIAGNOSIS — C8589 Other specified types of non-Hodgkin lymphoma, extranodal and solid organ sites: Secondary | ICD-10-CM

## 2011-07-22 MED ORDER — ONDANSETRON 8 MG/50ML IVPB (CHCC)
8.0000 mg | Freq: Once | INTRAVENOUS | Status: AC
  Administered 2011-07-22: 8 mg via INTRAVENOUS

## 2011-07-22 MED ORDER — DEXAMETHASONE SODIUM PHOSPHATE 10 MG/ML IJ SOLN
10.0000 mg | Freq: Once | INTRAMUSCULAR | Status: AC
  Administered 2011-07-22: 10 mg via INTRAVENOUS

## 2011-07-22 MED ORDER — SODIUM CHLORIDE 0.9 % IV SOLN
Freq: Once | INTRAVENOUS | Status: AC
  Administered 2011-07-22: 15:00:00 via INTRAVENOUS

## 2011-07-22 MED ORDER — SODIUM CHLORIDE 0.9 % IV SOLN
100.0000 mg/m2 | Freq: Once | INTRAVENOUS | Status: AC
  Administered 2011-07-22: 150 mg via INTRAVENOUS
  Filled 2011-07-22: qty 30

## 2011-07-22 NOTE — Patient Instructions (Signed)
Pt aware of future appts, no complaints, ambulatory.  SLJ 

## 2011-07-23 ENCOUNTER — Telehealth: Payer: Self-pay | Admitting: *Deleted

## 2011-07-23 ENCOUNTER — Ambulatory Visit (HOSPITAL_BASED_OUTPATIENT_CLINIC_OR_DEPARTMENT_OTHER): Payer: Medicare HMO

## 2011-07-23 VITALS — BP 116/61 | HR 86 | Temp 97.0°F

## 2011-07-23 DIAGNOSIS — C8590 Non-Hodgkin lymphoma, unspecified, unspecified site: Secondary | ICD-10-CM

## 2011-07-23 DIAGNOSIS — C8589 Other specified types of non-Hodgkin lymphoma, extranodal and solid organ sites: Secondary | ICD-10-CM

## 2011-07-23 MED ORDER — PEGFILGRASTIM INJECTION 6 MG/0.6ML
6.0000 mg | Freq: Once | SUBCUTANEOUS | Status: AC
  Administered 2011-07-23: 6 mg via SUBCUTANEOUS
  Filled 2011-07-23: qty 0.6

## 2011-07-23 NOTE — Telephone Encounter (Signed)
Patient here for Neulasta injection following 1st Treanda chemotherapy treatment.   She states that she isn't having any problems, no nausea, vomiting, or diarrhea.  Is drinking lots of fluids and eating without difficulty.  All questions answered and knows to call with any problems.

## 2011-07-26 ENCOUNTER — Other Ambulatory Visit: Payer: Medicare HMO | Admitting: Lab

## 2011-07-26 ENCOUNTER — Other Ambulatory Visit (HOSPITAL_BASED_OUTPATIENT_CLINIC_OR_DEPARTMENT_OTHER): Payer: Medicare HMO | Admitting: Lab

## 2011-07-26 DIAGNOSIS — C8590 Non-Hodgkin lymphoma, unspecified, unspecified site: Secondary | ICD-10-CM

## 2011-07-26 DIAGNOSIS — C8589 Other specified types of non-Hodgkin lymphoma, extranodal and solid organ sites: Secondary | ICD-10-CM

## 2011-07-26 LAB — COMPREHENSIVE METABOLIC PANEL
Alkaline Phosphatase: 142 U/L — ABNORMAL HIGH (ref 39–117)
CO2: 28 mEq/L (ref 19–32)
Creatinine, Ser: 1.15 mg/dL — ABNORMAL HIGH (ref 0.50–1.10)
Glucose, Bld: 129 mg/dL — ABNORMAL HIGH (ref 70–99)
Sodium: 131 mEq/L — ABNORMAL LOW (ref 135–145)
Total Bilirubin: 0.4 mg/dL (ref 0.3–1.2)
Total Protein: 5.9 g/dL — ABNORMAL LOW (ref 6.0–8.3)

## 2011-07-26 LAB — URIC ACID: Uric Acid, Serum: 2.3 mg/dL — ABNORMAL LOW (ref 2.4–7.0)

## 2011-07-26 LAB — CBC WITH DIFFERENTIAL/PLATELET
Basophils Absolute: 0 10*3/uL (ref 0.0–0.1)
Eosinophils Absolute: 0.2 10*3/uL (ref 0.0–0.5)
HGB: 12.3 g/dL (ref 11.6–15.9)
MCV: 95.4 fL (ref 79.5–101.0)
MONO#: 1.7 10*3/uL — ABNORMAL HIGH (ref 0.1–0.9)
MONO%: 4 % (ref 0.0–14.0)
NEUT#: 39.2 10*3/uL — ABNORMAL HIGH (ref 1.5–6.5)
RBC: 3.74 10*6/uL (ref 3.70–5.45)
RDW: 13.7 % (ref 11.2–14.5)
WBC: 41.6 10*3/uL — ABNORMAL HIGH (ref 3.9–10.3)
lymph#: 0.5 10*3/uL — ABNORMAL LOW (ref 0.9–3.3)

## 2011-07-26 LAB — PHOSPHORUS: Phosphorus: 3.1 mg/dL (ref 2.3–4.6)

## 2011-07-26 LAB — LACTATE DEHYDROGENASE: LDH: 268 U/L — ABNORMAL HIGH (ref 94–250)

## 2011-07-27 NOTE — Progress Notes (Signed)
Called patient with results of labs; patient has no signs of TLS, informed patient that she is slightly dehydrated and should increase her fluid intake to 2 Liters/daily, per Dr. Gaylyn Rong; patient verbalized understanding.

## 2011-07-29 ENCOUNTER — Encounter: Payer: Self-pay | Admitting: Oncology

## 2011-08-02 ENCOUNTER — Telehealth: Payer: Self-pay | Admitting: Internal Medicine

## 2011-08-02 NOTE — Telephone Encounter (Signed)
I spoke with Ms. Balfour tonight.  Earlier today she experienced some shortness of breath, dizziness and dry heaves.  With rest her symptoms resolved.  Tonight her husband noted a rash on her back.  It is erythematous and mildly pruritic in nature.  Review of her record showed she was initiated on cycle 1 of bendamustine on 07/21/11 with growth factor support.  Since then patient reports she has been on her regular medications and denies any changes.  I advised her this may be experiencing an allergic reaction.  She should take benadryl or  Zyrtec to help with her symptoms.  If her symptoms get worse she was advised to call for further instructions.  Arneta Mahmood 8:09 PM

## 2011-08-02 NOTE — Progress Notes (Signed)
Patient call this morning with c/o 'just feeling bad.'  Called patient back to assess and patient reports that she feels much better, temp 96.9 and no symptoms of N & V.  Reminded patient to call office if she begins to feel worse; patient verbalized understanding.

## 2011-08-03 ENCOUNTER — Telehealth: Payer: Self-pay | Admitting: *Deleted

## 2011-08-03 NOTE — Telephone Encounter (Signed)
Pt called to notify she called on call MD last night for ongoing rash on her back and "all over body" redness. This is documented by Dr. Markham Jordan.  Pt is taking zyrtec daily and benadryl prn itching.  She reports the redness is much better, but continues w/ rash and some itching.  Instructed pt to continue zyrtec and benadryl prn and to call if symptoms worsen.  She verbalized understanding.

## 2011-08-06 ENCOUNTER — Telehealth: Payer: Self-pay | Admitting: *Deleted

## 2011-08-06 MED ORDER — DIPHENHYD-HYDROCORT-NYSTATIN MT SUSP: 15.0000 mL | Freq: Four times a day (QID) | OROMUCOSAL | Status: DC | PRN

## 2011-08-06 NOTE — Telephone Encounter (Signed)
Pt called to report her tongue feels swollen and sore.  She denies any white patches or coating on tongue. States her rash is "a little better."   Notified Dr. Gaylyn Rong and he instructed for pt to start MMW and also to use Salt and Baking Soda rinses (swish and spit) every few hrs during the day.  Instructed pt on using MMW and salt/baking soda rinses.  She verbalized understanding.

## 2011-08-16 ENCOUNTER — Other Ambulatory Visit: Payer: Self-pay | Admitting: Oncology

## 2011-08-18 ENCOUNTER — Other Ambulatory Visit (HOSPITAL_BASED_OUTPATIENT_CLINIC_OR_DEPARTMENT_OTHER): Payer: Medicare HMO | Admitting: Lab

## 2011-08-18 ENCOUNTER — Ambulatory Visit: Payer: Medicare HMO | Admitting: Oncology

## 2011-08-18 ENCOUNTER — Ambulatory Visit (HOSPITAL_BASED_OUTPATIENT_CLINIC_OR_DEPARTMENT_OTHER): Payer: Medicare HMO

## 2011-08-18 VITALS — BP 96/62 | HR 73 | Temp 97.0°F

## 2011-08-18 VITALS — BP 114/63 | HR 112 | Temp 96.8°F | Ht 62.5 in | Wt 110.0 lb

## 2011-08-18 DIAGNOSIS — Z889 Allergy status to unspecified drugs, medicaments and biological substances status: Secondary | ICD-10-CM

## 2011-08-18 DIAGNOSIS — Z5112 Encounter for antineoplastic immunotherapy: Secondary | ICD-10-CM

## 2011-08-18 DIAGNOSIS — C8589 Other specified types of non-Hodgkin lymphoma, extranodal and solid organ sites: Secondary | ICD-10-CM

## 2011-08-18 DIAGNOSIS — C8590 Non-Hodgkin lymphoma, unspecified, unspecified site: Secondary | ICD-10-CM

## 2011-08-18 DIAGNOSIS — R0989 Other specified symptoms and signs involving the circulatory and respiratory systems: Secondary | ICD-10-CM

## 2011-08-18 LAB — COMPREHENSIVE METABOLIC PANEL
CO2: 22 mEq/L (ref 19–32)
Creatinine, Ser: 0.88 mg/dL (ref 0.50–1.10)
Glucose, Bld: 124 mg/dL — ABNORMAL HIGH (ref 70–99)
Sodium: 133 mEq/L — ABNORMAL LOW (ref 135–145)
Total Bilirubin: 0.4 mg/dL (ref 0.3–1.2)
Total Protein: 5.6 g/dL — ABNORMAL LOW (ref 6.0–8.3)

## 2011-08-18 LAB — URIC ACID: Uric Acid, Serum: 1.8 mg/dL — ABNORMAL LOW (ref 2.4–7.0)

## 2011-08-18 LAB — CBC WITH DIFFERENTIAL/PLATELET
Basophils Absolute: 0.1 10*3/uL (ref 0.0–0.1)
Eosinophils Absolute: 0.6 10*3/uL — ABNORMAL HIGH (ref 0.0–0.5)
HCT: 33.2 % — ABNORMAL LOW (ref 34.8–46.6)
HGB: 11.2 g/dL — ABNORMAL LOW (ref 11.6–15.9)
LYMPH%: 3.7 % — ABNORMAL LOW (ref 14.0–49.7)
MCV: 92.5 fL (ref 79.5–101.0)
MONO%: 5.6 % (ref 0.0–14.0)
NEUT#: 3.7 10*3/uL (ref 1.5–6.5)
Platelets: 254 10*3/uL (ref 145–400)

## 2011-08-18 LAB — LACTATE DEHYDROGENASE: LDH: 132 U/L (ref 94–250)

## 2011-08-18 MED ORDER — DEXAMETHASONE SODIUM PHOSPHATE 10 MG/ML IJ SOLN
10.0000 mg | Freq: Once | INTRAMUSCULAR | Status: AC
  Administered 2011-08-18: 10 mg via INTRAVENOUS

## 2011-08-18 MED ORDER — SODIUM CHLORIDE 0.9 % IV SOLN
375.0000 mg/m2 | Freq: Once | INTRAVENOUS | Status: AC
  Administered 2011-08-18: 600 mg via INTRAVENOUS
  Filled 2011-08-18: qty 60

## 2011-08-18 MED ORDER — SODIUM CHLORIDE 0.9 % IV SOLN
INTRAVENOUS | Status: DC
  Administered 2011-08-18: 11:00:00 via INTRAVENOUS

## 2011-08-18 MED ORDER — ACETAMINOPHEN 325 MG PO TABS
650.0000 mg | ORAL_TABLET | Freq: Once | ORAL | Status: AC
Start: 2011-08-18 — End: 2011-08-18
  Administered 2011-08-18: 650 mg via ORAL

## 2011-08-18 MED ORDER — DIPHENHYDRAMINE HCL 25 MG PO CAPS
50.0000 mg | ORAL_CAPSULE | Freq: Once | ORAL | Status: AC
Start: 2011-08-18 — End: 2011-08-18
  Administered 2011-08-18: 50 mg via ORAL

## 2011-08-18 MED ORDER — HYDROCODONE-ACETAMINOPHEN 5-325 MG PO TABS: 1.0000 | ORAL_TABLET | Freq: Four times a day (QID) | ORAL | Status: AC | PRN

## 2011-08-18 MED ORDER — LORATADINE 10 MG PO TABS: 10.0000 mg | ORAL_TABLET | Freq: Every day | ORAL | Status: DC | PRN

## 2011-08-18 MED ORDER — SODIUM CHLORIDE 0.9 % IV SOLN
85.0000 mg/m2 | Freq: Once | INTRAVENOUS | Status: AC
  Administered 2011-08-18: 125 mg via INTRAVENOUS
  Filled 2011-08-18: qty 25

## 2011-08-18 MED ORDER — ONDANSETRON 8 MG/50ML IVPB (CHCC)
8.0000 mg | Freq: Once | INTRAVENOUS | Status: AC
  Administered 2011-08-18: 8 mg via INTRAVENOUS

## 2011-08-18 MED ORDER — SODIUM CHLORIDE 0.9 % IV SOLN
Freq: Once | INTRAVENOUS | Status: AC
  Administered 2011-08-18: 10:00:00 via INTRAVENOUS

## 2011-08-18 NOTE — Progress Notes (Signed)
[facility] OFFICE PROGRESS NOTE  Cc:  [name], MD, MD  DIAGNOSIS:  Stage IV, low grade follicular lymphoma; FLIPI score of 3 or high risk (due to age >21; stage IV; and more than 4 areas of involved nodal areas).  Presenting beta-2 microglobulin of 2.42; LDH of 186.    CURRENT THERAPY: started q4 wk d1 Rituxan/Bendamustine; d2 Bendamustine; d3 Neulasta in Jan 2013.   INTERVAL HISTORY: Colleen Cooke 77 y.o. female returns for regular follow up before the 2nd cycle of chemo.  She has very good response to chemotherapy last month.  She can hardly palpate her neck node anymore.  However, she has been having skin rash in her back that she has been taking Benadryl and Claritin for.  She has had fatigue.  She is still independent of all activities of daily living; however, she does not want to do much outside the house.  She had mouth sore the first 2 wks after chemo which improved with magic mouth wash.  She has had decreased appetite and decreased weight.  She has had some dry heave and has been taking antiemetics at home. She has had diffuse bone pain from Neulasta injection and has been taking multiple ASA daily. She has had mild DOE but no fever, productive cough.   Patient denies headache, visual changes, confusion, drenching night sweats, palpable lymph node swelling, odynophagia, dysphagia, jaundice, chest pain, palpitation, productive cough, gum bleeding, epistaxis, hematemesis, hemoptysis, abdominal pain, abdominal swelling, early satiety, melena, hematochezia, hematuria, skin rash, spontaneous bleeding, joint swelling, joint pain, heat or cold intolerance, bowel bladder incontinence, focal motor weakness, paresthesia, depression, suicidal or homocidal ideation, feeling hopelessness.    MEDICAL HISTORY: Past Medical History  Diagnosis Date  . Chicken pox   . Hypertension   . Follicular lymphoma 05/2011    SURGICAL HISTORY:  Past Surgical History  Procedure Date    . Appendectomy     age 77  . Tonsillectomy     age 43    MEDICATIONS: Current Outpatient Prescriptions  Medication Sig Dispense Refill  . acyclovir (ZOVIRAX) 400 MG tablet Take 1 tablet (400 mg total) by mouth daily.  30 tablet  3  . allopurinol (ZYLOPRIM) 300 MG tablet TAKE ONE TABLET BY MOUTH TWICE DAILY  90 tablet  3  . aspirin 81 MG tablet Take 160 mg by mouth daily.        . Diphenhyd-Hydrocort-Nystatin SUSP Swish and spit 15 mLs 4 (four) times daily as needed (Swish/gargle and spit as needed for mouth soreness).  500 mL  2  . lisinopril-hydrochlorothiazide (PRINZIDE,ZESTORETIC) 10-12.5 MG per tablet Take 1 tablet by mouth daily.  90 tablet  3  . nystatin (MYCOSTATIN) 100000 UNIT/ML suspension       . ondansetron (ZOFRAN) 8 MG tablet Take 1 tablet two times a day starting the day after chemo for 2 days. Then take 1 tablet two times a day as needed for nausea or vomiting.  30 tablet  1  . prochlorperazine (COMPAZINE) 10 MG tablet Take 1 tablet (10 mg total) by mouth every 6 (six) hours as needed (Nausea or vomiting).  30 tablet  1  . Cholecalciferol (VITAMIN D3) 1000 UNITS CAPS Take 1 capsule by mouth daily.        . fish oil-omega-3 fatty acids 1000 MG capsule Take 1 g by mouth daily.        Marland Kitchen HYDROcodone-acetaminophen (NORCO) 5-325 MG per tablet Take 1 tablet by mouth every 6 (six) hours  as needed for pain.  90 tablet  0  . Hydrocodone-Acetaminophen 5-300 MG TABS       . loratadine (CLARITIN) 10 MG tablet Take 1 tablet (10 mg total) by mouth daily as needed for allergies.  90 tablet  3  . naproxen sodium (ANAPROX) 220 MG tablet Take 220 mg by mouth 2 (two) times daily as needed.         No current facility-administered medications for this visit.   Facility-Administered Medications Ordered in Other Visits  Medication Dose Route Frequency Provider Last Rate Last Dose  . 0.9 %  sodium chloride infusion   Intravenous Once Jethro Bolus, MD 20 mL/hr at 08/18/11 0945    . 0.9 %  sodium  chloride infusion   Intravenous Continuous Jethro Bolus, MD 999 mL/hr at 08/18/11 1052    . acetaminophen (TYLENOL) tablet 650 mg  650 mg Oral Once Jethro Bolus, MD   650 mg at 08/18/11 0946  . diphenhydrAMINE (BENADRYL) capsule 50 mg  50 mg Oral Once Jethro Bolus, MD   50 mg at 08/18/11 0946  . riTUXimab (RITUXAN) 600 mg in sodium chloride 0.9 % 190 mL chemo infusion  375 mg/m2 (Treatment Plan Actual) Intravenous Once Jethro Bolus, MD   600 mg at 08/18/11 1039    ALLERGIES:  is allergic to penicillins.  REVIEW OF SYSTEMS:  The rest of the 14-point review of system was negative.   Filed Vitals:   08/18/11 0818  BP: 114/63  Pulse: 112  Temp: 96.8 F (36 C)   Wt Readings from Last 3 Encounters:  08/18/11 110 lb (49.896 kg)  07/13/11 114 lb (51.71 kg)  07/02/11 111 lb 6.4 oz (50.531 kg)   ECOG Performance status: 1-2  PHYSICAL EXAMINATION:   General:  Thin-appearing woman in no acute distress.  Eyes:  no scleral icterus.  ENT:  There were no oropharyngeal lesions.  Neck was without thyromegaly.  Lymphatics: right cervical adenopathy is now about 2x1cm; left axillary adnopathy and right inguinal adenopathy were not longer palpable.  Respiratory: lungs were clear bilaterally without wheezing or crackles.  Cardiovascular:  Regular rate and rhythm, S1/S2, without murmur, rub or gallop.  There was no pedal edema.  GI:  abdomen was soft, flat, nontender, nondistended, without organomegaly.  Muscoloskeletal:  no spinal tenderness of palpation of vertebral spine.  Skin exam was without echymosis, petichae.  Neuro exam was nonfocal.  Patient was able to get on and off exam table without assistance.  Gait was normal.  Patient was alerted and oriented.  Attention was good.   Language was appropriate.  Mood was normal without depression.  Speech was not pressured.  Thought content was not tangential.      LABORATORY/RADIOLOGY DATA:  Lab Results  Component Value Date   WBC 4.8 08/18/2011   HGB 11.2* 08/18/2011   HCT  33.2* 08/18/2011   PLT 254 08/18/2011   GLUCOSE 129* 07/26/2011   ALT 15 07/26/2011   AST 18 07/26/2011   NA 131* 07/26/2011   K 3.8 07/26/2011   CL 94* 07/26/2011   CREATININE 1.15* 07/26/2011   BUN 14 07/26/2011   CO2 28 07/26/2011   TSH 1.64 06/09/2011    ASSESSMENT AND PLAN:   1.  Pleural effusion:  Differential diagnosis: most likely due to her extensive lymphoma involvement.   If this persists after treatment for lymphoma, will consider thoracentesis.   2.  State IV, low grade, but high risk follicular lymphoma: She is s/p one cycle of  Bendamustine/Rituxan.  She has grade 2 fatigue, grade 2 skin rash, grade 1 anemia, grade 1-2 anorexia, weight loss.  However, she has had very good response.  There was no evidence of tumor lysis syndrome after the 1st cycle of chemo.  I recommended to proceed with the 2nd cycle today with dose modification of Bendamustine from 100mg /m2 to 85mg /m2 and same dose of Rituxan.  We are aiming for 6 cycles total.  However, if she cannot tolerate, we may consider shorter treatment plan.   3.  Skin rash:  Either from Allopurinol or Bendamustine.  She has not had evidence of tumor lysis syndrome.  I recommended stopping Allopurinol.  I advised her to continue Benadryl and Claritin prn for skin rash if still persistent which could be from bendamustine.   4.  HTN:  Well controlled on Lisinopril/HCTZ per PCP.  I advised her not to take blood pressure med the days she is here for chemo to decrease the risk of hypotension with chemo.  5.  Grade 1 anemia:  From chemo. There is no active bleeding.  There is no need for pRBC transfusion.  6.  Mild calorie-protein malnutrion:  I advised her to increase her food intake.  If this continues to worsen in the future, I may consider marinol.    The length of time of the face-to-face encounter was 25 minutes. More than 50% of time was spent counseling and coordination of care.

## 2011-08-18 NOTE — Progress Notes (Signed)
Rituxan started at 1009 as a rapid infusion.  Rate began at 100cc/hr for 50cc.  After 30 minutes, VS assessed.  Patient asymptomatic, but blood pressure decreased as charted at 1039.  Rate was increased to 200cc/hr per protocol for 5 minutes and VS assessed.  Blood pressure further decreased as charted at 1045.  Dr. Gaylyn Rong made aware, ordered bolus 500cc NS.  Will reassess patient after 30 minutes.  Patient's blood pressure remained decreased post 30 minutes of pausing Rituxan.  Dr. Gaylyn Rong aware, okay to resume.  Patient drowsy but asymptomatic otherwise.  Infusion started at 100mg /hr dosing per standard infusion.  Blood pressure low, but stable after 30 minutes at this rate.  Increased to 200mg /hr rate.  Infusion maintained at this rate for remaining volume.  Dr. Gaylyn Rong instructed to tell patient to discontinue taking lisinopril and HCTZ at home until further notice.  RN instructed patient on this.

## 2011-08-19 ENCOUNTER — Ambulatory Visit (HOSPITAL_BASED_OUTPATIENT_CLINIC_OR_DEPARTMENT_OTHER): Payer: Medicare HMO

## 2011-08-19 DIAGNOSIS — C8589 Other specified types of non-Hodgkin lymphoma, extranodal and solid organ sites: Secondary | ICD-10-CM

## 2011-08-19 DIAGNOSIS — Z5111 Encounter for antineoplastic chemotherapy: Secondary | ICD-10-CM

## 2011-08-19 DIAGNOSIS — C8590 Non-Hodgkin lymphoma, unspecified, unspecified site: Secondary | ICD-10-CM

## 2011-08-19 MED ORDER — SODIUM CHLORIDE 0.9 % IV SOLN
85.0000 mg/m2 | Freq: Once | INTRAVENOUS | Status: AC
  Administered 2011-08-19: 125 mg via INTRAVENOUS
  Filled 2011-08-19: qty 25

## 2011-08-19 MED ORDER — SODIUM CHLORIDE 0.9 % IV SOLN
Freq: Once | INTRAVENOUS | Status: AC
  Administered 2011-08-19: 12:00:00 via INTRAVENOUS

## 2011-08-19 MED ORDER — DEXAMETHASONE SODIUM PHOSPHATE 10 MG/ML IJ SOLN
10.0000 mg | Freq: Once | INTRAMUSCULAR | Status: AC
  Administered 2011-08-19: 10 mg via INTRAVENOUS

## 2011-08-19 MED ORDER — ONDANSETRON 8 MG/50ML IVPB (CHCC)
8.0000 mg | Freq: Once | INTRAVENOUS | Status: AC
  Administered 2011-08-19: 8 mg via INTRAVENOUS

## 2011-08-20 ENCOUNTER — Ambulatory Visit (HOSPITAL_BASED_OUTPATIENT_CLINIC_OR_DEPARTMENT_OTHER): Payer: Medicare HMO

## 2011-08-20 VITALS — BP 117/56 | HR 92 | Temp 97.8°F

## 2011-08-20 DIAGNOSIS — Z5189 Encounter for other specified aftercare: Secondary | ICD-10-CM

## 2011-08-20 DIAGNOSIS — C8590 Non-Hodgkin lymphoma, unspecified, unspecified site: Secondary | ICD-10-CM

## 2011-08-20 DIAGNOSIS — C8589 Other specified types of non-Hodgkin lymphoma, extranodal and solid organ sites: Secondary | ICD-10-CM

## 2011-08-20 MED ORDER — PEGFILGRASTIM INJECTION 6 MG/0.6ML
6.0000 mg | Freq: Once | SUBCUTANEOUS | Status: AC
  Administered 2011-08-20: 6 mg via SUBCUTANEOUS
  Filled 2011-08-20: qty 0.6

## 2011-08-26 ENCOUNTER — Encounter: Payer: Self-pay | Admitting: Family Medicine

## 2011-08-26 ENCOUNTER — Ambulatory Visit (INDEPENDENT_AMBULATORY_CARE_PROVIDER_SITE_OTHER): Payer: Medicare HMO | Admitting: Family Medicine

## 2011-08-26 VITALS — BP 150/80 | Temp 97.7°F | Wt 118.0 lb

## 2011-08-26 DIAGNOSIS — R0609 Other forms of dyspnea: Secondary | ICD-10-CM

## 2011-08-26 DIAGNOSIS — C8590 Non-Hodgkin lymphoma, unspecified, unspecified site: Secondary | ICD-10-CM

## 2011-08-26 DIAGNOSIS — R600 Localized edema: Secondary | ICD-10-CM

## 2011-08-26 DIAGNOSIS — R0600 Dyspnea, unspecified: Secondary | ICD-10-CM

## 2011-08-26 DIAGNOSIS — C8589 Other specified types of non-Hodgkin lymphoma, extranodal and solid organ sites: Secondary | ICD-10-CM

## 2011-08-26 DIAGNOSIS — R609 Edema, unspecified: Secondary | ICD-10-CM

## 2011-08-26 LAB — BRAIN NATRIURETIC PEPTIDE: Pro B Natriuretic peptide (BNP): 252 pg/mL — ABNORMAL HIGH (ref 0.0–100.0)

## 2011-08-26 MED ORDER — FUROSEMIDE 20 MG PO TABS: 20.0000 mg | ORAL_TABLET | Freq: Every day | ORAL | Status: DC

## 2011-08-26 NOTE — Progress Notes (Addendum)
  Subjective:    Patient ID: Colleen Cooke, female    DOB: Jul 09, 1934, 77 y.o.   MRN: [id number]  HPI  Patient seen with progressive bilateral lower extremity edema. She's had some chronic left lower extremity edema with previous Dopplers negative. Over the past week or so has noted progressive edema right lower tibia as well. Does have some discomfort with ambulation. Recent diagnosis of lymphoma. She has finished 2 rounds of chemotherapy. She had diffuse body aches and increased malaise. Recent labs reviewed. Albumin 3.6. Creatinine 0.8. Previously on lisinopril HCTZ but had to stop taking because of low blood pressure following chemotherapy. She denied any history of heart failure. Chest CT January 2013 revealed right pleural effusion. No history of DVT. She has had recurrent cellulitis left lower extremity. No recent fever or chills. She has some low-grade dyspnea with activity but unchanged. No chest pain and no pleuritic pain  Past Medical History  Diagnosis Date  . Chicken pox   . Hypertension   . Follicular lymphoma 05/2011   Past Surgical History  Procedure Date  . Appendectomy     age 89  . Tonsillectomy     age 56    reports that she quit smoking about 17 years ago. Her smoking use included Cigarettes. She has a 20 pack-year smoking history. She has never used smokeless tobacco. She reports that she does not drink alcohol or use illicit drugs. family history includes Coronary artery disease (age of onset:71) in her mother. Allergies  Allergen Reactions  . Penicillins     REACTION: swelling localized      Review of Systems  Constitutional: Positive for appetite change and fatigue. Negative for fever and chills.  Respiratory: Positive for shortness of breath. Negative for wheezing.   Cardiovascular: Positive for leg swelling. Negative for chest pain and palpitations.  Gastrointestinal: Negative for abdominal pain.  Genitourinary: Negative for dysuria.  Neurological:  Negative for dizziness and syncope.       Objective:   Physical Exam  Constitutional: She is oriented to person, place, and time.       thin cooperative nontoxic appearing female  HENT:  Mouth/Throat: Oropharynx is clear and moist.  Cardiovascular: Normal rate and regular rhythm.   Pulmonary/Chest:       Diminished breath sounds right lung base related to previous pleural effusion. No rales. No wheezes  Abdominal: Soft. There is no tenderness.  Musculoskeletal:       Patient is 2+ pitting edema legs up to the knees and also has some mild edema thighs bilaterally.  Neurological: She is alert and oriented to person, place, and time.          Assessment & Plan:  Progressive bilateral lower extremity edema. She has relatively normal albumin and normal kidney function. No suspicion for acute heart failure. Although bilateral, rule out DVT (RLE edema is new and high risk with Lymphoma . Also obtain d-dimer and BNP levels. Cautious use of low-dose Lasix 20 mg daily and reassess one week and check basic metabolic panel then. Monitor blood pressure closely  Patient minimally elevated BNP. Doubt CHF. Official Doppler report not back yet but we did not receive a new reports of any clot in her lower extremities. She does have a slightly elevated d-dimer but denies any pleuritic pain, sudden dyspnea, or hemoptysis. If she develops any shortness of breath or pleuritic pain consider CT angiogram chest to further evaluate

## 2011-08-27 ENCOUNTER — Encounter (INDEPENDENT_AMBULATORY_CARE_PROVIDER_SITE_OTHER): Payer: Medicare HMO

## 2011-08-27 ENCOUNTER — Ambulatory Visit: Payer: Medicare HMO | Admitting: Nutrition

## 2011-08-27 ENCOUNTER — Encounter: Payer: Medicare HMO | Admitting: Nutrition

## 2011-08-27 DIAGNOSIS — R600 Localized edema: Secondary | ICD-10-CM

## 2011-08-27 DIAGNOSIS — R609 Edema, unspecified: Secondary | ICD-10-CM

## 2011-08-27 LAB — D-DIMER, QUANTITATIVE: D-Dimer, Quant: 2.55 ug/mL-FEU — ABNORMAL HIGH (ref 0.00–0.48)

## 2011-08-27 NOTE — Assessment & Plan Note (Signed)
Colleen Cooke is a 77 year old female patient of Dr. Lodema Pilot.  She was diagnosed with a stage IV low-grade follicular lymphoma and is receiving chemotherapy.  MEDICAL HISTORY INCLUDES:  Includes hypertension and edema.  MEDICATIONS INCLUDE:  vitamin D3, omega-3 fatty acids, Lasix, Norco, Zofran and Compazine.  LABS:  Include sodium of 133, glucose of 124, BUN of 25, all documented on 02/20.  HEIGHT:  5 feet 2 inches. WEIGHT:  On February 20th was 110, however, when I called the patient today weight had increased to 118 secondary to fluid retention. BMI:  19.79 on February 20th.  Patient was identified to be at risk secondary to nutrition risk screening.  I called the patient on the phone today and spoke with her. She reports that she feels like she is eating better; however, she does continue to have some nausea which is relieved by her medications.  She has taste alterations.  She saw her physician yesterday who was concerned with her 8 pound weight gain in the last 8 days and has ordered some further testing.  Patient does report that she has swelling in her lower extremities.  NUTRITION DIAGNOSIS:  Food and nutrition-related knowledge deficit related to poor appetite and recent edema as evidenced by no prior need for nutrition-related information.  INTERVENTION:  I have educated the patient on the importance of small amounts of food often throughout the day.  I have encouraged high-protein meals and snacks.  I suggested she may want to be careful of how much sodium she consumes.  We discussed appropriate food choices for her until her physician can provide her with some additional information. She has bought some Ensure and some Boost to help with her oral intake. I have also recommended that she may want to try [employer]entials for increased protein intake.  I will mail fact sheets, coupons and my contact information to the patient.  MONITORING/EVALUATION (GOALS):  The patient will  tolerate increased oral intake to promote preservation of lean body mass.  NEXT VISIT:  Patient will call with questions or concerns.    ______________________________ Colleen Cooke, RD, LDN Clinical Nutrition Specialist BN/MEDQ  D:  08/27/2011  T:  08/27/2011  Job:  [id number]

## 2011-09-07 ENCOUNTER — Ambulatory Visit (INDEPENDENT_AMBULATORY_CARE_PROVIDER_SITE_OTHER): Payer: Medicare HMO | Admitting: Family Medicine

## 2011-09-07 ENCOUNTER — Encounter: Payer: Self-pay | Admitting: Family Medicine

## 2011-09-07 VITALS — BP 110/60 | Temp 97.9°F | Wt 114.0 lb

## 2011-09-07 DIAGNOSIS — R0600 Dyspnea, unspecified: Secondary | ICD-10-CM

## 2011-09-07 DIAGNOSIS — R609 Edema, unspecified: Secondary | ICD-10-CM

## 2011-09-07 DIAGNOSIS — R0989 Other specified symptoms and signs involving the circulatory and respiratory systems: Secondary | ICD-10-CM

## 2011-09-07 DIAGNOSIS — R600 Localized edema: Secondary | ICD-10-CM

## 2011-09-07 LAB — BASIC METABOLIC PANEL
BUN: 10 mg/dL (ref 6–23)
Chloride: 95 mEq/L — ABNORMAL LOW (ref 96–112)
Potassium: 4.2 mEq/L (ref 3.5–5.1)

## 2011-09-07 MED ORDER — POTASSIUM CHLORIDE CRYS ER 20 MEQ PO TBCR: 20.0000 meq | EXTENDED_RELEASE_TABLET | Freq: Every day | ORAL | Status: DC

## 2011-09-07 MED ORDER — FUROSEMIDE 40 MG PO TABS: 40.0000 mg | ORAL_TABLET | Freq: Every day | ORAL | Status: DC

## 2011-09-07 NOTE — Progress Notes (Signed)
  Subjective:    Patient ID: Colleen Cooke, female    DOB: [birth year], 77 y.o.   MRN: [id number]  HPI  Patient has recently diagnosed lymphoma and is seen in followup for edema. Albumin 3.6 recently with creatinine 0.8. We started Lasix 20 mg daily and her weight is down 4 pounds but she has not seen much improvement edema. Because of her recent cancer diagnosis we obtained Doppler scans and no evidence for DVT. BNP level equivocal but clinically no evidence for heart failure. She has some chronic dyspnea with exertion over the past several weeks but wonders if this may be deconditioning from her chemotherapy. No hemoptysis. No pleuritic pain. No change in diet. Improved oral intake since last visit  Review of Systems  Constitutional: Negative for fever, chills, appetite change and unexpected weight change.  HENT: Negative for sore throat.   Respiratory: Positive for shortness of breath. Negative for cough and wheezing.   Cardiovascular: Positive for leg swelling. Negative for chest pain and palpitations.  Gastrointestinal: Negative for abdominal pain.  Genitourinary: Negative for dysuria.  Neurological: Negative for dizziness and syncope.  Psychiatric/Behavioral: Negative for confusion.       Objective:   Physical Exam  Constitutional: She appears well-developed and well-nourished.  Neck: Neck supple. No thyromegaly present.  Cardiovascular: Normal rate and regular rhythm.   Pulmonary/Chest: Effort normal and breath sounds normal. No respiratory distress. She has no wheezes. She has no rales.  Musculoskeletal: She exhibits edema.       Patient has 2+ pitting edema feet ankles lower legs bilaterally  Lymphadenopathy:    She has no cervical adenopathy.          Assessment & Plan:  #1 bilateral leg edema not much improved. Cautious titration Lasix 40 mg daily. Check basic metabolic panel. Add K-Dur 20 milliequivalents daily. Reassess 2 weeks. Frequent elevation. She's tried  compression stockings but cannot wear secondary to discomfort #2 mild dyspnea. Pulse oximetry 96%. CT angiogram to rule out PE though no recent DVT by Doppler. She is at increased risk because of her cancer diagnoses

## 2011-09-08 NOTE — Progress Notes (Signed)
Quick Note:  Pt informed on home VM ______ 

## 2011-09-09 ENCOUNTER — Ambulatory Visit (INDEPENDENT_AMBULATORY_CARE_PROVIDER_SITE_OTHER)
Admission: RE | Admit: 2011-09-09 | Discharge: 2011-09-09 | Disposition: A | Discharge status: Home / Self Care | Payer: Medicare HMO | Source: Ambulatory Visit | Attending: Family Medicine | Admitting: Family Medicine

## 2011-09-09 DIAGNOSIS — R0600 Dyspnea, unspecified: Secondary | ICD-10-CM

## 2011-09-09 DIAGNOSIS — R0989 Other specified symptoms and signs involving the circulatory and respiratory systems: Secondary | ICD-10-CM

## 2011-09-09 MED ORDER — IOHEXOL 300 MG/ML  SOLN
75.0000 mL | Freq: Once | INTRAMUSCULAR | Status: AC | PRN
  Administered 2011-09-09: 75 mL via INTRAVENOUS

## 2011-09-10 NOTE — Progress Notes (Signed)
Quick Note:  Pt informed ______ 

## 2011-09-11 ENCOUNTER — Other Ambulatory Visit: Payer: Self-pay | Admitting: Oncology

## 2011-09-15 ENCOUNTER — Other Ambulatory Visit (HOSPITAL_BASED_OUTPATIENT_CLINIC_OR_DEPARTMENT_OTHER): Payer: Medicare HMO | Admitting: Lab

## 2011-09-15 ENCOUNTER — Telehealth: Payer: Self-pay | Admitting: Oncology

## 2011-09-15 ENCOUNTER — Ambulatory Visit (HOSPITAL_BASED_OUTPATIENT_CLINIC_OR_DEPARTMENT_OTHER): Payer: Medicare HMO

## 2011-09-15 ENCOUNTER — Ambulatory Visit (HOSPITAL_BASED_OUTPATIENT_CLINIC_OR_DEPARTMENT_OTHER): Payer: Medicare HMO | Admitting: Oncology

## 2011-09-15 VITALS — BP 104/55 | HR 95 | Temp 97.6°F

## 2011-09-15 VITALS — BP 112/62 | HR 105 | Temp 96.8°F | Ht 62.5 in | Wt 105.0 lb

## 2011-09-15 DIAGNOSIS — E441 Mild protein-calorie malnutrition: Secondary | ICD-10-CM

## 2011-09-15 DIAGNOSIS — Z889 Allergy status to unspecified drugs, medicaments and biological substances status: Secondary | ICD-10-CM

## 2011-09-15 DIAGNOSIS — Z5112 Encounter for antineoplastic immunotherapy: Secondary | ICD-10-CM

## 2011-09-15 DIAGNOSIS — C8590 Non-Hodgkin lymphoma, unspecified, unspecified site: Secondary | ICD-10-CM

## 2011-09-15 DIAGNOSIS — J90 Pleural effusion, not elsewhere classified: Secondary | ICD-10-CM

## 2011-09-15 DIAGNOSIS — C8589 Other specified types of non-Hodgkin lymphoma, extranodal and solid organ sites: Secondary | ICD-10-CM

## 2011-09-15 DIAGNOSIS — M898X9 Other specified disorders of bone, unspecified site: Secondary | ICD-10-CM

## 2011-09-15 DIAGNOSIS — R0989 Other specified symptoms and signs involving the circulatory and respiratory systems: Secondary | ICD-10-CM

## 2011-09-15 DIAGNOSIS — T451X5A Adverse effect of antineoplastic and immunosuppressive drugs, initial encounter: Secondary | ICD-10-CM

## 2011-09-15 LAB — COMPREHENSIVE METABOLIC PANEL
ALT: 11 U/L (ref 0–35)
CO2: 21 mEq/L (ref 19–32)
Calcium: 9.7 mg/dL (ref 8.4–10.5)
Chloride: 97 mEq/L (ref 96–112)
Creatinine, Ser: 0.73 mg/dL (ref 0.50–1.10)
Glucose, Bld: 98 mg/dL (ref 70–99)
Total Bilirubin: 0.3 mg/dL (ref 0.3–1.2)

## 2011-09-15 LAB — CBC WITH DIFFERENTIAL/PLATELET
Basophils Absolute: 0.1 10*3/uL (ref 0.0–0.1)
Eosinophils Absolute: 0.2 10*3/uL (ref 0.0–0.5)
HCT: 33.7 % — ABNORMAL LOW (ref 34.8–46.6)
HGB: 11.2 g/dL — ABNORMAL LOW (ref 11.6–15.9)
LYMPH%: 8.6 % — ABNORMAL LOW (ref 14.0–49.7)
MCV: 96.3 fL (ref 79.5–101.0)
MONO#: 1.8 10*3/uL — ABNORMAL HIGH (ref 0.1–0.9)
MONO%: 18 % — ABNORMAL HIGH (ref 0.0–14.0)
NEUT#: 7.2 10*3/uL — ABNORMAL HIGH (ref 1.5–6.5)
NEUT%: 71 % (ref 38.4–76.8)
Platelets: 338 10*3/uL (ref 145–400)
WBC: 10.1 10*3/uL (ref 3.9–10.3)
nRBC: 0 % (ref 0–0)

## 2011-09-15 LAB — LACTATE DEHYDROGENASE: LDH: 158 U/L (ref 94–250)

## 2011-09-15 MED ORDER — SODIUM CHLORIDE 0.9 % IV SOLN: 375.0000 mg/m2 | Freq: Once | INTRAVENOUS | Status: DC

## 2011-09-15 MED ORDER — TRAMADOL HCL 50 MG PO TABS: 50.0000 mg | ORAL_TABLET | Freq: Four times a day (QID) | ORAL | Status: AC | PRN

## 2011-09-15 MED ORDER — DEXAMETHASONE SODIUM PHOSPHATE 10 MG/ML IJ SOLN
10.0000 mg | Freq: Once | INTRAMUSCULAR | Status: AC
  Administered 2011-09-15: 10 mg via INTRAVENOUS

## 2011-09-15 MED ORDER — SODIUM CHLORIDE 0.9 % IV SOLN
75.0000 mg/m2 | Freq: Once | INTRAVENOUS | Status: AC
  Administered 2011-09-15: 110 mg via INTRAVENOUS
  Filled 2011-09-15: qty 22

## 2011-09-15 MED ORDER — ACETAMINOPHEN 325 MG PO TABS
650.0000 mg | ORAL_TABLET | Freq: Once | ORAL | Status: AC
  Administered 2011-09-15: 650 mg via ORAL

## 2011-09-15 MED ORDER — SODIUM CHLORIDE 0.9 % IV SOLN
375.0000 mg/m2 | Freq: Once | INTRAVENOUS | Status: AC
  Administered 2011-09-15: 500 mg via INTRAVENOUS
  Filled 2011-09-15: qty 50

## 2011-09-15 MED ORDER — ONDANSETRON 8 MG/50ML IVPB (CHCC)
8.0000 mg | Freq: Once | INTRAVENOUS | Status: AC
  Administered 2011-09-15: 8 mg via INTRAVENOUS

## 2011-09-15 MED ORDER — SODIUM CHLORIDE 0.9 % IV SOLN
Freq: Once | INTRAVENOUS | Status: AC
  Administered 2011-09-15: 11:00:00 via INTRAVENOUS

## 2011-09-15 MED ORDER — DIPHENHYDRAMINE HCL 25 MG PO CAPS
50.0000 mg | ORAL_CAPSULE | Freq: Once | ORAL | Status: AC
  Administered 2011-09-15: 50 mg via ORAL

## 2011-09-15 NOTE — Telephone Encounter (Signed)
gv pt appt for thoracentesis 3/22. Other appts or April/may already scheduled and pt has schedule for march thru may.

## 2011-09-15 NOTE — Progress Notes (Signed)
[facility] OFFICE PROGRESS NOTE  Cc:  Colleen Covey, MD, MD  DIAGNOSIS:  Stage IV, low grade follicular lymphoma; FLIPI score of 3 or high risk (due to age >34; stage IV; and more than 4 areas of involved nodal areas).  Presenting beta-2 microglobulin of 2.42; LDH of 186.    CURRENT THERAPY: started q4 wk d1 Rituxan/Bendamustine; d2 Bendamustine; d3 Neulasta in Jan 2013.   INTERVAL HISTORY: Colleen Cooke 77 y.o. female returns for regular follow up before the 3rd cycle of chemo.  She is here by herself.  She reported that she had history of bilateral lower extremity cellulitis in the past with erythema and swelling.  Over the past 2 weeks, she again developed this similar symptoms. She was evaluated by her primary doctor and was thought to have cellulitis and was given 2 different courses of antibiotics without improvement of the symptoms.  She then had bilateral lower extremity ultrasound Doppler which was negative for DVT.  She was just started on Lasix with improvement of her edema. She has been having low back pain ever since she started receiving Neulasta injection the day after chemotherapy.  She then had a CT angio to rule out PE which was negative on 09/09/2011.  The bone pain is moderate to severe; at worst it is is about 10/10. Today it is ranked about 5/10.  She was taking Vicodin with relief of pain however it was causing insomnia. Therefore she has been taking Vicodin only at night before going to bed.  The pain is intermittent; worse with certain motion. The pain does not cause her to have low extremity weakness, paresthesia, bowel bladder incontinence.   With respect to chemotherapy bendamustine and Rituxan, she has not had any fever, headache, mucositis, nausea vomiting, infection.  She has mild to moderate fatigue however, she is still independent of activities of daily living.  She could no longer palpate any adenopathy.  Patient denies headache, visual changes,  confusion, drenching night sweats, mucositis, odynophagia, dysphagia, nausea vomiting, jaundice, chest pain, palpitation, shortness of breath, dyspnea on exertion, productive cough, gum bleeding, epistaxis, hematemesis, hemoptysis, abdominal pain, abdominal swelling, early satiety, melena, hematochezia, hematuria, skin rash, spontaneous bleeding, joint swelling, joint pain, heat or cold intolerance, bowel bladder incontinence, back pain, focal motor weakness, paresthesia, depression, suicidal or homocidal ideation, feeling hopelessness.    MEDICAL HISTORY: Past Medical History  Diagnosis Date  . Chicken pox   . Hypertension   . Follicular lymphoma 05/2011    SURGICAL HISTORY:  Past Surgical History  Procedure Date  . Appendectomy     age 69  . Tonsillectomy     age 53    MEDICATIONS: Current Outpatient Prescriptions  Medication Sig Dispense Refill  . acyclovir (ZOVIRAX) 400 MG tablet Take 1 tablet (400 mg total) by mouth daily.  30 tablet  3  . aspirin 81 MG tablet Take 160 mg by mouth daily.        . furosemide (LASIX) 40 MG tablet Take 1 tablet (40 mg total) by mouth daily.  30 tablet  3  . Hydrocodone-Acetaminophen 5-300 MG TABS       . lisinopril-hydrochlorothiazide (PRINZIDE,ZESTORETIC) 10-12.5 MG per tablet       . loratadine (CLARITIN) 10 MG tablet Take 1 tablet (10 mg total) by mouth daily as needed for allergies.  90 tablet  3  . naproxen sodium (ANAPROX) 220 MG tablet Take 220 mg by mouth 2 (two) times daily as needed.        Marland Kitchen  ondansetron (ZOFRAN) 8 MG tablet Take 1 tablet two times a day starting the day after chemo for 2 days. Then take 1 tablet two times a day as needed for nausea or vomiting.  30 tablet  1  . potassium chloride SA (K-DUR,KLOR-CON) 20 MEQ tablet Take 1 tablet (20 mEq total) by mouth daily.  30 tablet  3  . prochlorperazine (COMPAZINE) 10 MG tablet TAKE ONE TABLET BY MOUTH EVERY 6 HOURS AS NEEDED FOR NAUSEA FOR VOMITING OR NAUSEA  60 tablet  3  .  Diphenhyd-Hydrocort-Nystatin SUSP Swish and spit 15 mLs 4 (four) times daily as needed (Swish/gargle and spit as needed for mouth soreness).  500 mL  2  . traMADol (ULTRAM) 50 MG tablet Take 1 tablet (50 mg total) by mouth every 6 (six) hours as needed for pain.  30 tablet  0   No current facility-administered medications for this visit.   Facility-Administered Medications Ordered in Other Visits  Medication Dose Route Frequency Provider Last Rate Last Dose  . 0.9 %  sodium chloride infusion   Intravenous Once Colleen Parody, MD 20 mL/hr at 09/15/11 1040    . acetaminophen (TYLENOL) tablet 650 mg  650 mg Oral Once Colleen Parody, MD   650 mg at 09/15/11 1053  . bendamustine (TREANDA) 110 mg in sodium chloride 0.9 % 500 mL chemo infusion  75 mg/m2 (Treatment Plan Actual) Intravenous Once Colleen Parody, MD      . dexamethasone (DECADRON) injection 10 mg  10 mg Intravenous Once Colleen Parody, MD   10 mg at 09/15/11 1051  . diphenhydrAMINE (BENADRYL) capsule 50 mg  50 mg Oral Once Colleen Parody, MD   50 mg at 09/15/11 1053  . ondansetron (ZOFRAN) IVPB 8 mg  8 mg Intravenous Once Colleen Parody, MD   8 mg at 09/15/11 1050  . riTUXimab (RITUXAN) 500 mg in sodium chloride 0.9 % 250 mL chemo infusion  375 mg/m2 (Order-Specific) Intravenous Once Colleen Parody, MD 263 mL/hr at 09/15/11 1300 500 mg at 09/15/11 1300  . DISCONTD: riTUXimab (RITUXAN) 500 mg in sodium chloride 0.9 % 200 mL chemo infusion  375 mg/m2 (Order-Specific) Intravenous Once Colleen Parody, MD      . DISCONTD: riTUXimab (RITUXAN) 600 mg in sodium chloride 0.9 % 190 mL chemo infusion  375 mg/m2 (Treatment Plan Actual) Intravenous Once Colleen Parody, MD        ALLERGIES:  is allergic to penicillins.  REVIEW OF SYSTEMS:  The rest of the 14-point review of system was negative.   Filed Vitals:   09/15/11 0850  BP: 112/62  Pulse: 105  Temp: 96.8 F (36 C)   Wt Readings from Last 3 Encounters:  09/15/11 105 lb (47.628 kg)  09/07/11 114 lb (51.71 kg)  08/26/11 118 lb  (53.524 kg)   ECOG Performance status: 1-2  PHYSICAL EXAMINATION:   General:  Thin-appearing woman in no acute distress.  Eyes:  no scleral icterus.  ENT:  There were no oropharyngeal lesions.  Neck was without thyromegaly.  Lymphatics: right cervical adenopathy is now about 2x1cm; left axillary adnopathy and right inguinal adenopathy were not longer palpable.  Respiratory: showed decreased breath sound and decreased tympany of right lower lobe.  Cardiovascular:  Regular rate and rhythm, S1/S2, without murmur, rub or gallop.  There was no pedal edema.  GI:  abdomen was soft, flat, nontender, nondistended, without organomegaly.  Muscoloskeletal:  no spinal tenderness of palpation of vertebral spine.  Skin exam was without echymosis, petichae.  Neuro exam was nonfocal.  Patient was able to get on and off exam table without assistance.  Gait was normal.  Patient was alerted and oriented.  Attention was good.   Language was appropriate.  Mood was normal without depression.  Speech was not pressured.  Thought content was not tangential.      LABORATORY/RADIOLOGY DATA:  Lab Results  Component Value Date   WBC 10.1 09/15/2011   HGB 11.2* 09/15/2011   HCT 33.7* 09/15/2011   PLT 338 09/15/2011   GLUCOSE 87 09/07/2011   ALT 9 08/18/2011   AST 18 08/18/2011   NA 133* 09/07/2011   K 4.2 09/07/2011   CL 95* 09/07/2011   CREATININE 0.8 09/07/2011   BUN 10 09/07/2011   CO2 29 09/07/2011   TSH 1.64 06/09/2011    ASSESSMENT AND PLAN:   1.  Right pleural effusion:  Differential diagnosis: most likely due to her extensive lymphoma involvement.  Her extensive adenopathy has resolved; however, these pleural effusion persist. Therefore, I referred patient to undergo ultrasound-guided diagnostic thoracentesis and send for LDH, glucose, cell count, culture, cytology to further evaluate this right pleural effusion.    2.  State IV, low grade, but high risk follicular lymphoma: She is s/p 2 cycles of  Bendamustine/Rituxan.  Despite dose reduction after the fist cycle, she still has grade 2 fatigue, grade 1 anemia, grade 1-2 anorexia, weight loss.  However, she has had very good response.  Given that she still has borderline performance status, I recommended decreasing dose of Bendamustine further to 75mg /m2 and keep Rituxan the same.   3.  Skin rash:  Either from Allopurinol or Bendamustine.  It has resolved with discontinuation of Bendamustine.   4.  Bone pain:  From Neulasta.  She can only take it at night because of its somnolence. I recommended adding on tramadol during the day so that she can get some relief from bone pain. I recommend a potential option to switch Neulasta to Neupogen 5 days in a row.  However, she doesn't want to have injection for 5 days after chemotherapy and therefore would like to continue with Neulasta for now.  5.  HTN:  Well controlled on Lisinopril/HCTZ per PCP.  I advised her not to take blood pressure med the days she is here for chemo to decrease the risk of hypotension with chemo.  6  Grade 1 anemia:  From chemo. There is no active bleeding.  There is no need for pRBC transfusion.  7.  Mild calorie-protein malnutrion:  I advised her to increase her food intake.  She is taking in one can of boost daily.  I advised her to increase this to 2-3 cans/day.  She deferred marinol at this time.  8.  Follow up:  In about 1 month for cycle #4 of chemo.

## 2011-09-16 ENCOUNTER — Ambulatory Visit (HOSPITAL_BASED_OUTPATIENT_CLINIC_OR_DEPARTMENT_OTHER): Payer: Medicare HMO

## 2011-09-16 VITALS — BP 127/69 | HR 109 | Temp 97.9°F

## 2011-09-16 DIAGNOSIS — Z5111 Encounter for antineoplastic chemotherapy: Secondary | ICD-10-CM

## 2011-09-16 DIAGNOSIS — C8590 Non-Hodgkin lymphoma, unspecified, unspecified site: Secondary | ICD-10-CM

## 2011-09-16 DIAGNOSIS — C8589 Other specified types of non-Hodgkin lymphoma, extranodal and solid organ sites: Secondary | ICD-10-CM

## 2011-09-16 MED ORDER — ONDANSETRON 8 MG/50ML IVPB (CHCC)
8.0000 mg | Freq: Once | INTRAVENOUS | Status: AC
  Administered 2011-09-16: 8 mg via INTRAVENOUS

## 2011-09-16 MED ORDER — DEXAMETHASONE SODIUM PHOSPHATE 10 MG/ML IJ SOLN
10.0000 mg | Freq: Once | INTRAMUSCULAR | Status: AC
  Administered 2011-09-16: 10 mg via INTRAVENOUS

## 2011-09-16 MED ORDER — SODIUM CHLORIDE 0.9 % IV SOLN
75.0000 mg/m2 | Freq: Once | INTRAVENOUS | Status: AC
  Administered 2011-09-16: 110 mg via INTRAVENOUS
  Filled 2011-09-16: qty 22

## 2011-09-17 ENCOUNTER — Ambulatory Visit (HOSPITAL_COMMUNITY)
Admission: RE | Admit: 2011-09-17 | Discharge: 2011-09-17 | Disposition: A | Discharge status: Home / Self Care | Payer: Medicare HMO | Source: Ambulatory Visit | Attending: Oncology | Admitting: Oncology

## 2011-09-17 ENCOUNTER — Ambulatory Visit: Payer: Medicare HMO

## 2011-09-17 ENCOUNTER — Other Ambulatory Visit: Payer: Self-pay | Admitting: Oncology

## 2011-09-17 ENCOUNTER — Ambulatory Visit (HOSPITAL_COMMUNITY)
Admission: RE | Admit: 2011-09-17 | Discharge: 2011-09-17 | Disposition: A | Discharge status: Home / Self Care | Payer: Medicare HMO | Source: Ambulatory Visit | Attending: Radiology | Admitting: Radiology

## 2011-09-17 ENCOUNTER — Ambulatory Visit (HOSPITAL_BASED_OUTPATIENT_CLINIC_OR_DEPARTMENT_OTHER): Payer: Medicare HMO

## 2011-09-17 VITALS — BP 128/72 | HR 93 | Temp 97.6°F

## 2011-09-17 VITALS — BP 122/52

## 2011-09-17 DIAGNOSIS — J90 Pleural effusion, not elsewhere classified: Secondary | ICD-10-CM | POA: Insufficient documentation

## 2011-09-17 DIAGNOSIS — C8589 Other specified types of non-Hodgkin lymphoma, extranodal and solid organ sites: Secondary | ICD-10-CM | POA: Insufficient documentation

## 2011-09-17 DIAGNOSIS — C8590 Non-Hodgkin lymphoma, unspecified, unspecified site: Secondary | ICD-10-CM

## 2011-09-17 DIAGNOSIS — M898X9 Other specified disorders of bone, unspecified site: Secondary | ICD-10-CM

## 2011-09-17 LAB — BODY FLUID CELL COUNT WITH DIFFERENTIAL
Eos, Fluid: 3 %
Lymphs, Fluid: 65 %
Neutrophil Count, Fluid: 29 % — ABNORMAL HIGH (ref 0–25)

## 2011-09-17 LAB — PROTEIN, BODY FLUID: Total protein, fluid: 3.4 g/dL

## 2011-09-17 MED ORDER — PEGFILGRASTIM INJECTION 6 MG/0.6ML
6.0000 mg | Freq: Once | SUBCUTANEOUS | Status: AC
  Administered 2011-09-17: 6 mg via SUBCUTANEOUS
  Filled 2011-09-17: qty 0.6

## 2011-09-17 NOTE — Procedures (Signed)
US guided diagnostic and therapeutic right thoracentesis performed yielding 730 cc' s chylous fluid. The specimen was sent to the lab for preordered studies. F/u CXR pending. No immediate complications.

## 2011-09-20 LAB — PATHOLOGIST SMEAR REVIEW

## 2011-09-21 ENCOUNTER — Encounter: Payer: Self-pay | Admitting: Family Medicine

## 2011-09-21 ENCOUNTER — Ambulatory Visit (INDEPENDENT_AMBULATORY_CARE_PROVIDER_SITE_OTHER): Payer: Medicare HMO | Admitting: Family Medicine

## 2011-09-21 VITALS — BP 120/60 | Temp 97.6°F | Wt 99.0 lb

## 2011-09-21 DIAGNOSIS — C8589 Other specified types of non-Hodgkin lymphoma, extranodal and solid organ sites: Secondary | ICD-10-CM

## 2011-09-21 DIAGNOSIS — C8590 Non-Hodgkin lymphoma, unspecified, unspecified site: Secondary | ICD-10-CM

## 2011-09-21 DIAGNOSIS — R600 Localized edema: Secondary | ICD-10-CM

## 2011-09-21 DIAGNOSIS — R609 Edema, unspecified: Secondary | ICD-10-CM

## 2011-09-21 LAB — BODY FLUID CULTURE

## 2011-09-21 NOTE — Progress Notes (Signed)
  Subjective:    Patient ID: Colleen Cooke, female    DOB: [birth year], 77 y.o.   MRN: [id number]  HPI  Medical followup. Patient has history of lymphoma currently undergoing treatment. Recent thoracentesis. She has ongoing cough which is unchanged over couple months. Recent CT angiogram revealed no evidence for pulmonary embolus. She's had some progressive peripheral edema and we started furosemide 40 mg daily. Weight down 15 pounds from last visit. Recent electrolytes 09/15/2011 reviewed and normal potassium and sodium.  Normal renal function.  She has intermittent nausea and poor appetite and poor intake. She is drinking plenty of fluids. She denies any fever.  Past Medical History  Diagnosis Date  . Chicken pox   . Hypertension   . Follicular lymphoma 05/2011   Past Surgical History  Procedure Date  . Appendectomy     age 74  . Tonsillectomy     age 31    reports that she quit smoking about 17 years ago. Her smoking use included Cigarettes. She has a 20 pack-year smoking history. She has never used smokeless tobacco. She reports that she does not drink alcohol or use illicit drugs. family history includes Coronary artery disease (age of onset:71) in her mother. Allergies  Allergen Reactions  . Penicillins     REACTION: swelling localized      Review of Systems  Constitutional: Positive for appetite change, fatigue and unexpected weight change. Negative for fever and chills.  Respiratory: Positive for cough and shortness of breath. Negative for wheezing.   Cardiovascular: Negative for chest pain and palpitations.  Gastrointestinal: Negative for abdominal pain.  Genitourinary: Negative for dysuria.  Neurological: Negative for syncope.       Objective:   Physical Exam  Constitutional:       Alert thin somewhat frail-appearing female in no distress  HENT:       Tongue is slightly dry otherwise clear  Cardiovascular: Normal rate and regular rhythm.   Pulmonary/Chest:  Effort normal. She has no wheezes. She has no rales.  Musculoskeletal: She exhibits edema.       Still has trace edema lower legs bilaterally but overall greatly improved          Assessment & Plan:  #1 peripheral edema. Improved. Reduce furosemide to 20 mg daily. Continue potassium replacement. Recent electrolytes normal  #2 anorexia. She is taking various antinausea medications. She will discuss with oncologist possible appetite stimulant

## 2011-09-21 NOTE — Patient Instructions (Signed)
Reduce Lasix (furosemide) to one half tablet daily.

## 2011-09-23 ENCOUNTER — Telehealth: Payer: Self-pay

## 2011-09-23 ENCOUNTER — Telehealth: Payer: Self-pay | Admitting: Oncology

## 2011-09-23 NOTE — Telephone Encounter (Signed)
Received call from patient this am. Returned her call to discuss wanting to stop treatment. Reviewed recommendations made by Dr Gaylyn Rong at her last visit. Patient does not want to stop Neulasta and get daily Neupogen injections. She is not taking Hydrocodone because it makes her too sleepy. Has Ultram, but says not really helping. Using PRN Tylenol. Encouraged her to keep appt as scheduled on 4/17 to further discuss treatment. She will do this. Will discuss with Dr Gaylyn Rong for any additional recommendations to help patient complete planned course of therapy.

## 2011-09-23 NOTE — Telephone Encounter (Signed)
Colleen Cooke CALLED AND LEFT A MESSAGE THAT SHE WANTS TO CANCELL ALL HER CHEMOTHERAPY TREATMENTS.  SHE CANNOT STAND THE PAIN AND THE THOUGHT OF 2 1/2 YEARS LEFT OF TREATMENT.  SHE IS SORRY.

## 2011-09-28 ENCOUNTER — Other Ambulatory Visit: Payer: Self-pay | Admitting: Certified Registered Nurse Anesthetist

## 2011-09-28 NOTE — Progress Notes (Signed)
Patient called this morning c/o back pain; patient states that she only takes Tylenol for her pain, because Vicodin ' makes her sleepy' ; states that this morning she had to take a Vicodin, because the pain was so bad; educated patien on taking Vicodin every 4 hours as directed to reduce back pain; reminded patient to call back if pain continues to increase; patient states that she will take 2 Vicodin tablets with next dosage, and take as directed; patient verbalized understanding.

## 2011-09-30 ENCOUNTER — Ambulatory Visit (HOSPITAL_BASED_OUTPATIENT_CLINIC_OR_DEPARTMENT_OTHER): Payer: Medicare HMO | Admitting: Oncology

## 2011-09-30 ENCOUNTER — Telehealth: Payer: Self-pay | Admitting: Oncology

## 2011-09-30 ENCOUNTER — Other Ambulatory Visit (HOSPITAL_BASED_OUTPATIENT_CLINIC_OR_DEPARTMENT_OTHER): Payer: Medicare HMO | Admitting: Lab

## 2011-09-30 ENCOUNTER — Encounter: Payer: Self-pay | Admitting: Oncology

## 2011-09-30 VITALS — BP 121/73 | HR 120 | Temp 96.8°F | Ht 62.5 in | Wt 96.5 lb

## 2011-09-30 DIAGNOSIS — M899 Disorder of bone, unspecified: Secondary | ICD-10-CM

## 2011-09-30 DIAGNOSIS — M545 Low back pain: Secondary | ICD-10-CM

## 2011-09-30 DIAGNOSIS — R634 Abnormal weight loss: Secondary | ICD-10-CM

## 2011-09-30 DIAGNOSIS — M898X9 Other specified disorders of bone, unspecified site: Secondary | ICD-10-CM

## 2011-09-30 DIAGNOSIS — C8589 Other specified types of non-Hodgkin lymphoma, extranodal and solid organ sites: Secondary | ICD-10-CM

## 2011-09-30 DIAGNOSIS — C8590 Non-Hodgkin lymphoma, unspecified, unspecified site: Secondary | ICD-10-CM

## 2011-09-30 DIAGNOSIS — R630 Anorexia: Secondary | ICD-10-CM

## 2011-09-30 LAB — CBC WITH DIFFERENTIAL/PLATELET
BASO%: 0.2 % (ref 0.0–2.0)
Basophils Absolute: 0 10*3/uL (ref 0.0–0.1)
EOS%: 0.8 % (ref 0.0–7.0)
Eosinophils Absolute: 0.1 10*3/uL (ref 0.0–0.5)
HCT: 32.5 % — ABNORMAL LOW (ref 34.8–46.6)
HGB: 10.6 g/dL — ABNORMAL LOW (ref 11.6–15.9)
LYMPH%: 1.6 % — ABNORMAL LOW (ref 14.0–49.7)
MCH: 32.2 pg (ref 25.1–34.0)
MCHC: 32.5 g/dL (ref 31.5–36.0)
MCV: 99.2 fL (ref 79.5–101.0)
MONO#: 1.4 10*3/uL — ABNORMAL HIGH (ref 0.1–0.9)
MONO%: 8 % (ref 0.0–14.0)
NEUT#: 15.7 10*3/uL — ABNORMAL HIGH (ref 1.5–6.5)
NEUT%: 89.4 % — ABNORMAL HIGH (ref 38.4–76.8)
Platelets: 400 10*3/uL (ref 145–400)
RBC: 3.27 10*6/uL — ABNORMAL LOW (ref 3.70–5.45)
RDW: 16.7 % — ABNORMAL HIGH (ref 11.2–14.5)
WBC: 17.6 10*3/uL — ABNORMAL HIGH (ref 3.9–10.3)
lymph#: 0.3 10*3/uL — ABNORMAL LOW (ref 0.9–3.3)
nRBC: 0 % (ref 0–0)

## 2011-09-30 MED ORDER — GABAPENTIN 100 MG PO CAPS: 100.0000 mg | ORAL_CAPSULE | Freq: Three times a day (TID) | ORAL | Status: DC

## 2011-09-30 MED ORDER — MORPHINE SULFATE CR 15 MG PO TB12: 15.0000 mg | ORAL_TABLET | Freq: Two times a day (BID) | ORAL | Status: DC

## 2011-09-30 MED ORDER — MORPHINE SULFATE 15 MG PO TABS: 15.0000 mg | ORAL_TABLET | ORAL | Status: DC | PRN

## 2011-09-30 MED ORDER — MIRTAZAPINE 7.5 MG PO TABS: 7.5000 mg | ORAL_TABLET | Freq: Every day | ORAL | Status: DC

## 2011-09-30 NOTE — Progress Notes (Signed)
[facility] OFFICE PROGRESS NOTE  Cc:  Colleen Covey, MD, MD  DIAGNOSIS:  Stage IV, low grade follicular lymphoma; FLIPI score of 3 or high risk (due to age >6; stage IV; and more than 4 areas of involved nodal areas).  Presenting beta-2 microglobulin of 2.42; LDH of 186.    CURRENT THERAPY: started q4 wk d1 Rituxan/Bendamustine; d2 Bendamustine; d3 Neulasta in Jan 2013.   INTERVAL HISTORY: Colleen Cooke 77 y.o. female returns for a work in appointment.  She had right upper back pain that was mild before starting chemotherapy.  However, ever since starting chemo along with Neulasta injection, she has been having progressively worsened bilateral upper back pain (worse in the right upper back).  Pain is described as severe 9/10; constant, worse with heavy lifting.  Pain has both sharp and electrical shock quality to it.  Her pain was somewhat relieved by Tramadol 1 month ago; however, it stopped working.  The pain does not cause upper or lower extremity weakness.  Thus, a month ago, she started Vicodin which caused her drowsiness.  She had stopped Vicodin routine use until about 10 days ago when the pain has worsened significantly with the last injection of Neulasta on 09/17/2011.   Vicodin only provides about 30-60 minutes of relief and she needs to take about 6 tablets a day to get through her day.  She has significant fatigue and spends a lot of time at rest.  She also has significant decrease in her appetite and further weight loss.  She also has upper airway congestion with nonproductive cough.  She denies SOB, CP, palpitation, nausea/vomiting, fever, constipation, bleeding symptoms, skin rash.      MEDICAL HISTORY: Past Medical History  Diagnosis Date  . Chicken pox   . Hypertension   . Follicular lymphoma 05/2011    SURGICAL HISTORY:  Past Surgical History  Procedure Date  . Appendectomy     age 89  . Tonsillectomy     age 12    MEDICATIONS: Current Outpatient  Prescriptions  Medication Sig Dispense Refill  . acyclovir (ZOVIRAX) 400 MG tablet Take 1 tablet (400 mg total) by mouth daily.  30 tablet  3  . aspirin 81 MG tablet Take 160 mg by mouth daily.        . Diphenhyd-Hydrocort-Nystatin SUSP Swish and spit 15 mLs 4 (four) times daily as needed (Swish/gargle and spit as needed for mouth soreness).  500 mL  2  . furosemide (LASIX) 40 MG tablet Take 1 tablet (40 mg total) by mouth daily.  30 tablet  3  . Hydrocodone-Acetaminophen 5-300 MG TABS       . lisinopril-hydrochlorothiazide (PRINZIDE,ZESTORETIC) 10-12.5 MG per tablet Take by mouth daily.       Marland Kitchen loratadine (CLARITIN) 10 MG tablet Take 1 tablet (10 mg total) by mouth daily as needed for allergies.  90 tablet  3  . naproxen sodium (ANAPROX) 220 MG tablet Take 220 mg by mouth 2 (two) times daily as needed.        . ondansetron (ZOFRAN) 8 MG tablet Take 1 tablet two times a day starting the day after chemo for 2 days. Then take 1 tablet two times a day as needed for nausea or vomiting.  30 tablet  1  . potassium chloride SA (K-DUR,KLOR-CON) 20 MEQ tablet Take 1 tablet (20 mEq total) by mouth daily.  30 tablet  3  . prochlorperazine (COMPAZINE) 10 MG tablet TAKE ONE TABLET BY MOUTH EVERY 6  HOURS AS NEEDED FOR NAUSEA FOR VOMITING OR NAUSEA  60 tablet  3  . gabapentin (NEURONTIN) 100 MG capsule Take 1 capsule (100 mg total) by mouth 3 (three) times daily.  30 capsule  0  . mirtazapine (REMERON) 7.5 MG tablet Take 1 tablet (7.5 mg total) by mouth at bedtime.  30 tablet  3  . morphine (MS CONTIN) 15 MG 12 hr tablet Take 1 tablet (15 mg total) by mouth 2 (two) times daily.  20 tablet  0  . morphine (MSIR) 15 MG tablet Take 1 tablet (15 mg total) by mouth every 4 (four) hours as needed for pain.  30 tablet  0  . traMADol (ULTRAM) 50 MG tablet Take 50 mg by mouth Every 6 hours.      Marland Kitchen DISCONTD: gabapentin (NEURONTIN) 100 MG capsule Take 1 capsule (100 mg total) by mouth 3 (three) times daily.  30 capsule  0     ALLERGIES:  is allergic to penicillins.  REVIEW OF SYSTEMS:  The rest of the 14-point review of system was negative.   Filed Vitals:   09/30/11 1453  BP: 121/73  Pulse: 120  Temp: 96.8 F (36 C)   Wt Readings from Last 3 Encounters:  09/30/11 96 lb 8 oz (43.772 kg)  09/21/11 99 lb (44.906 kg)  09/15/11 105 lb (47.628 kg)   ECOG Performance status: 1-2  PHYSICAL EXAMINATION:   General:  Thin-appearing woman in distress from the right upper back pain. There was increased work of breathing.  Eyes:  no scleral icterus.  ENT:  There were no oropharyngeal lesions.  Neck was without thyromegaly.  Lymphatics: I could not appreciate any palpable adenopathy on exam today.   Respiratory: clear breath sound bilaterally without decreased in tympany.  There was no rales or crackles.   Cardiovascular:  Tachy but regular rate and rhythm, S1/S2, without murmur, rub or gallop.  There was no pedal edema.  GI:  abdomen was soft, flat, nontender, nondistended, without organomegaly.  Muscoloskeletal:  Pain to palpation (touching) not even percussion of the upper back.  She claimed that she felt electrical shock with my touching of her skin in the bilateral upper back.   Skin exam was without echymosis, petichae.  Neuro exam was nonfocal.  Patient was able to get on and off exam table without assistance.  Gait was normal.  Patient was alerted and oriented.  Attention was good.   Language was appropriate.  Mood was normal without depression.  Speech was not pressured.  Thought content was not tangential.      LABORATORY/RADIOLOGY DATA:  Lab Results  Component Value Date   WBC 17.6* 09/30/2011   HGB 10.6* 09/30/2011   HCT 32.5* 09/30/2011   PLT 400 09/30/2011   GLUCOSE 98 09/15/2011   ALT 11 09/15/2011   AST 18 09/15/2011   NA 135 09/15/2011   K 4.1 09/15/2011   CL 97 09/15/2011   CREATININE 0.73 09/15/2011   BUN 11 09/15/2011   CO2 21 09/15/2011   TSH 1.64 06/09/2011    ASSESSMENT AND PLAN:     1. Right  worse than left upper back pain: -Differential:  Neulasta-induced severe myalgia.  However, this pain is normally over after 7 days.  She has been 2 weeks out from the last Neulasta injection.  She may also have some pleurisy due to the right pleural effusion.  I have low clinical concern for bone involvement of her lymphoma since her adenopathy has completely resolved, and  at diagnosis, there was no involvement of her vertebral bodies.  I have a difficult time since there is also a neuropathic component to her pain.   Last possibility includes DJD and radiculopathy. - Work up:  PET scan to restage her lymphoma which will also rule out vertebral abnormality if there is lymphoma involvement.  I may consider MRI of her thorax/lumbar area if PET scan is unrevealing and her symptoms persist.  - Treatment:  She has no significant response to Vicodin.  I recommended long acting MS Cotin 15mg  PO BID.  I replaced Vicodin with morphine sulfate IR 15mg  PO q4-6 hours prn breakthrough pain.  I advised her to watch out for over sedation.  I asked her husband to keep an eye on her.  I also start Neurontin 100mg  PO daily x 3; then 100mg  PO BID x 3; then 100mg  PO TID.  I advised her on bowel regimen.    2. Right pleural effusion:  Pleural fluid was exudative; but cytology and culture were both negative.  On exam today, she did not appear to have fast accumulation.  I will re-evaluate this effusion on this coming PET scan.  If she has re-accumulation, I will discuss with her PCP for further work up.   3. State IV, low grade, but high risk follicular lymphoma:  As this was the first time that her husband comes in to the exam room.  I spent a fair amount of time discussion the prognosis, PET scan at diagnosis and treatment with her husband.  She is s/p 3 cycles of Bendamustine/Rituxan.  Despite two dose reductions, she has had grade 2-3 fatigue/anorexia/weight loss.  I recommended discontinuation of chemo Bendamustine/Rituxan  and restage with PET scan.  I discussed with patient and her husband and pros and cons of maintenance Rituxan which can increase the duration of remission.  She would like to think this over and let us know after the PET scan.     4.  Skin rash:  Either from Allopurinol or Bendamustine.  It has resolved with discontinuation of Bendamustine.    5.  HTN:  Well controlled on Lisinopril/HCTZ per PCP.  I advised her not to take blood pressure med the days she is here for chemo to decrease the risk of hypotension with chemo.   6.  Grade 1 anemia:  From chemo. There is no active bleeding.  There is no need for pRBC transfusion.   7. Moderate calorie-protein malnutrion and weight loss:  I prescribed Remeron 7.5 mg PO qhs.   8. Tachycardia:  Most likely due to pain.  I have low pretest probability for PE since she has no SOB, CP.  The back pain is mainly neuropathic and musculoskeletal in nature on my exam.    Hopefully, her tachycardia will improve with better pain control.    9. Follow up:  The day after restaging PET scan in about 1 wk.    The length of time of the face-to-face encounter was 40 minutes. More than 50% of time was spent counseling and coordination of care.

## 2011-09-30 NOTE — Progress Notes (Signed)
Patient husband stop by my office today needing some financial assistance for his wife,so I gave him an epp application for his wife to fill out,and also we told him we may be able to offer co pay assistance for his wife chemo drugs.

## 2011-09-30 NOTE — Telephone Encounter (Signed)
gve the pt her April 2013 appt calendar. Colleen Cooke the pt that she did not need the lab appt on 10/13/2011 just the md appt at 9:30am would be fine.

## 2011-09-30 NOTE — Progress Notes (Signed)
[facility] END OF TREATMENT   Name: Colleen Cooke Date: 09/30/2011 MRN: [id number] DOB: Jun 04, 1935   TREATMENT DATES:  07/21/11 to 09/17/2011   REFERRING PHYSICIAN: [name], MD   DIAGNOSIS:  Follicular lymphoma.   STAGE AT START OF TREATMENT:  IV   INTENT:Palliative   DRUGS OR REGIMENS GIVEN:  Bendamustine/Rituxan.    MAJOR TOXICITIES:  Grade 3 bone pain; grade 2-3 anorexia/weight loss/fatigue.    REASON TREATMENT STOPPED:  Toxicities as above.    PERFORMANCE STATUS AT END:   ECOG 2.    ONGOING PROBLEMS:  Right pleural effusion (unclear etiology with negative cytology and cultures);  Severe back pain.     FOLLOW UP PLANS:  PET scan within 1 wk.  If back pain does not improve, I may consider MRI thorax/lumbar.

## 2011-10-01 LAB — COMPREHENSIVE METABOLIC PANEL
AST: 17 U/L (ref 0–37)
Albumin: 3.4 g/dL — ABNORMAL LOW (ref 3.5–5.2)
Alkaline Phosphatase: 291 U/L — ABNORMAL HIGH (ref 39–117)
Potassium: 4.1 mEq/L (ref 3.5–5.3)
Sodium: 134 mEq/L — ABNORMAL LOW (ref 135–145)
Total Protein: 5.9 g/dL — ABNORMAL LOW (ref 6.0–8.3)

## 2011-10-06 ENCOUNTER — Telehealth: Payer: Self-pay | Admitting: Family Medicine

## 2011-10-06 ENCOUNTER — Ambulatory Visit (INDEPENDENT_AMBULATORY_CARE_PROVIDER_SITE_OTHER): Payer: Medicare HMO | Admitting: Family Medicine

## 2011-10-06 ENCOUNTER — Telehealth: Payer: Self-pay | Admitting: Oncology

## 2011-10-06 ENCOUNTER — Encounter: Payer: Self-pay | Admitting: Family Medicine

## 2011-10-06 VITALS — BP 90/64 | HR 120 | Temp 98.0°F | Wt 95.0 lb

## 2011-10-06 DIAGNOSIS — C8590 Non-Hodgkin lymphoma, unspecified, unspecified site: Secondary | ICD-10-CM

## 2011-10-06 DIAGNOSIS — C8589 Other specified types of non-Hodgkin lymphoma, extranodal and solid organ sites: Secondary | ICD-10-CM

## 2011-10-06 DIAGNOSIS — M546 Pain in thoracic spine: Secondary | ICD-10-CM

## 2011-10-06 DIAGNOSIS — I10 Essential (primary) hypertension: Secondary | ICD-10-CM

## 2011-10-06 NOTE — Telephone Encounter (Signed)
Call to pt's husband to discuss medications. Reports that pt was sedated when given MS Contin and MSIR. Has stopped taking these and now only taking Tylenol. Husband reports that she is still having some confusion despite being off morphine. Continues to have back pain, but no neurological changes. Was given a new prescription for Neurontin at her last visit and currently taking this twice a day. Husband instructed to give pt 1/2 tab of MSIR (7.5 mg) q 4 hours as needed for pain and to decrease Neurontin to once a day. Keep appts as scheduled, but call us back if pain not controlled or confusion worsens before next visit. States understanding of instructions.

## 2011-10-06 NOTE — Telephone Encounter (Signed)
Pts spouse called and said that [place] had just called re: paperwork and said that it was ok that nurse mail hippa paperwork or designated party release to pts home for signature.

## 2011-10-06 NOTE — Telephone Encounter (Signed)
Pt did not sign DPR forms for her daughters, will mail to pt home and they will return to office

## 2011-10-06 NOTE — Progress Notes (Signed)
Subjective:    Patient ID: Colleen Cooke, female    DOB: Mar 24, 1935, 77 y.o.   MRN: [id number]  HPI  Patient here today to discuss some issues with thoracic back pain. Refer to prior note from oncologist for details. She had some pain prior to her lymphoma diagnosis. No evidence for any bony involvement. Her pain is mostly right upper thoracic and mid thoracic region with some radiation toward the left side. Relatively constant. Nine out of 10 severity and sharp. She gets some relief with position change and Tylenol. She has large pleural effusion with recent thoracentesis. She was prescribed morphine extended release and after taking just one dose had significant sedation and confusion and husband has not given any since then. They were also given immediate release morphine which they never used. She was also placed on gabapentin low dose for possible neuropathic pain. She's not taking tramadol. Previous Vicodin caused increased sedation.  She's had very poor appetite and some weight loss. Recently started on Remeron 7.5 mg daily. Thus far no improvement in appetite. Denies any nausea or vomiting. No fever or chills. No cough. Some dyspnea with exertion which has been chronic for several weeks. She has followup PET scan ordered soon to further evaluate. Consideration for MRI thoracic spine if PET scan revealing  Leg edema has improved and off lasix at this time.  Past Medical History  Diagnosis Date  . Chicken pox   . Hypertension   . Follicular lymphoma 05/2011   Past Surgical History  Procedure Date  . Appendectomy     age 37  . Tonsillectomy     age 72    reports that she quit smoking about 17 years ago. Her smoking use included Cigarettes. She has a 20 pack-year smoking history. She has never used smokeless tobacco. She reports that she does not drink alcohol or use illicit drugs. family history includes Coronary artery disease (age of onset:71) in her mother. Allergies  Allergen  Reactions  . Penicillins     REACTION: swelling localized      Review of Systems  Constitutional: Positive for appetite change, fatigue and unexpected weight change. Negative for fever and chills.  Respiratory: Positive for shortness of breath. Negative for wheezing.   Cardiovascular: Negative for chest pain, palpitations and leg swelling.  Gastrointestinal: Negative for abdominal pain.  Genitourinary: Negative for dysuria.  Musculoskeletal: Positive for back pain.  Skin: Negative for rash.  Neurological: Negative for dizziness and headaches.  Hematological: Negative for adenopathy.       Objective:   Physical Exam  Constitutional: She is oriented to person, place, and time.       thin somewhat cachectic 19 old female  HENT:  Mouth/Throat: Oropharynx is clear and moist.  Neck: Neck supple.  Cardiovascular: Regular rhythm.   Pulmonary/Chest: Effort normal.       Decreased aeration right base from pleural fusion  Musculoskeletal: She exhibits no edema.  Neurological: She is alert and oriented to person, place, and time.          Assessment & Plan:  Thoracic back pain. As previously outlined question related from pleural fusion versus neuropathic versus related to her cancer versus degenerative disc disease. She has had intolerance of extended release morphine. Previous sedation with Vicodin. At this point will discuss with oncologist. Patient requesting consideration for Percocet but explained this would likely be as sedating (if not more sedating) than Vicodin and morphine. We explained pain management needs to be through one physician only  at this point will defer to oncologist who is seeing her actively.  PET scan pending  Anorexia. Hopefully will improve with Remeron  History of hypertension. Continue to hold blood pressure medications at this time with low blood pressure off medication

## 2011-10-08 ENCOUNTER — Ambulatory Visit: Payer: Medicare HMO | Admitting: Family Medicine

## 2011-10-08 ENCOUNTER — Telehealth: Payer: Self-pay | Admitting: *Deleted

## 2011-10-08 NOTE — Telephone Encounter (Signed)
Husband called and wants RN to speak w/ pt..  I spoke w/ pt who reports ongoing severe back pain "all over."  Not relieved w/ 1/2 MS IR this morning.  She just took another 1/2 tablet 5 minutes ago.  Instructed pt to take the other 1/2 MS IR now,  Since pt states it did not cause any confusion or over sedation this morning.  Pt verbalized understanding. Spoke w/ husband and also instructed pt may try 1 whole MS IR and I will call back in a few hours to see how she tolerates it and if it is helping her pain.  Husband verbalized understanding.

## 2011-10-08 NOTE — Telephone Encounter (Signed)
Called pt back to check on pain and any adverse effects from taking the whole MS IR 15 mg tablet.  Pt was alert and clear on phone,  States the MS IR helped her back pain "some" and she did lay down for a short nap but does not feel confused or overly sedated.   Instructed pt she may continue to take the MS IR q 4hrs prn pain and hold for  Over sedation or confusion.   Suggested she alternate w/ tylenol.  Pt verbalized understanding.

## 2011-10-12 ENCOUNTER — Encounter (HOSPITAL_COMMUNITY)
Admission: RE | Admit: 2011-10-12 | Discharge: 2011-10-12 | Disposition: A | Discharge status: Home / Self Care | Payer: Medicare HMO | Source: Ambulatory Visit | Attending: Oncology | Admitting: Oncology

## 2011-10-12 DIAGNOSIS — M899 Disorder of bone, unspecified: Secondary | ICD-10-CM | POA: Insufficient documentation

## 2011-10-12 DIAGNOSIS — C8590 Non-Hodgkin lymphoma, unspecified, unspecified site: Secondary | ICD-10-CM

## 2011-10-12 DIAGNOSIS — R634 Abnormal weight loss: Secondary | ICD-10-CM

## 2011-10-12 DIAGNOSIS — R630 Anorexia: Secondary | ICD-10-CM

## 2011-10-12 DIAGNOSIS — M949 Disorder of cartilage, unspecified: Secondary | ICD-10-CM | POA: Insufficient documentation

## 2011-10-12 DIAGNOSIS — C8589 Other specified types of non-Hodgkin lymphoma, extranodal and solid organ sites: Secondary | ICD-10-CM | POA: Insufficient documentation

## 2011-10-12 DIAGNOSIS — M898X9 Other specified disorders of bone, unspecified site: Secondary | ICD-10-CM

## 2011-10-12 MED ORDER — FLUDEOXYGLUCOSE F - 18 (FDG) INJECTION
17.1000 | Freq: Once | INTRAVENOUS | Status: AC | PRN
  Administered 2011-10-12: 17.1 via INTRAVENOUS

## 2011-10-13 ENCOUNTER — Ambulatory Visit (HOSPITAL_BASED_OUTPATIENT_CLINIC_OR_DEPARTMENT_OTHER): Payer: Medicare HMO | Admitting: Oncology

## 2011-10-13 ENCOUNTER — Ambulatory Visit: Payer: Medicare HMO

## 2011-10-13 ENCOUNTER — Encounter: Payer: Self-pay | Admitting: Oncology

## 2011-10-13 ENCOUNTER — Ambulatory Visit: Payer: Medicare HMO | Admitting: Oncology

## 2011-10-13 ENCOUNTER — Other Ambulatory Visit: Payer: Medicare HMO

## 2011-10-13 ENCOUNTER — Telehealth: Payer: Self-pay | Admitting: Oncology

## 2011-10-13 VITALS — BP 107/56 | HR 121 | Temp 96.7°F | Ht 62.5 in | Wt 92.0 lb

## 2011-10-13 DIAGNOSIS — T451X5A Adverse effect of antineoplastic and immunosuppressive drugs, initial encounter: Secondary | ICD-10-CM

## 2011-10-13 DIAGNOSIS — E440 Moderate protein-calorie malnutrition: Secondary | ICD-10-CM

## 2011-10-13 DIAGNOSIS — M549 Dorsalgia, unspecified: Secondary | ICD-10-CM

## 2011-10-13 DIAGNOSIS — C8590 Non-Hodgkin lymphoma, unspecified, unspecified site: Secondary | ICD-10-CM

## 2011-10-13 DIAGNOSIS — C8589 Other specified types of non-Hodgkin lymphoma, extranodal and solid organ sites: Secondary | ICD-10-CM

## 2011-10-13 MED ORDER — MORPHINE SULFATE 15 MG PO TABS: 15.0000 mg | ORAL_TABLET | Freq: Four times a day (QID) | ORAL | Status: DC | PRN

## 2011-10-13 MED ORDER — LEVOFLOXACIN 500 MG PO TABS: 500.0000 mg | ORAL_TABLET | Freq: Every day | ORAL | Status: DC

## 2011-10-13 NOTE — Telephone Encounter (Signed)
appts made and printed for pt  °

## 2011-10-13 NOTE — Patient Instructions (Signed)
A.  Result of PET scan  NUCLEAR MEDICINE PET SKULL BASE TO THIGH  Fasting Blood Glucose: 101  Technique: 17.1 mCi F-18 FDG was injected intravenously. CT data  was obtained and used for attenuation correction and anatomic  localization only. (This was not acquired as a diagnostic CT  examination.) Additional exam technical data entered on  technologist worksheet.  Comparison: PET scan 07/08/2011 scan  Findings:  Neck: There is marked reduction in hypermetabolic activity within  the right neck with no measurable hypermetabolic activity  remaining.  Chest: Interval resolution of metabolic active associated with the  right supraclavicular axillary lymph nodes. No residual  hypermetabolic activity remains. There is intense metabolic  activity associated with a focus of consolidation in the superior  segment of the left lower lobe (image 85). This has intense  metabolic activity with SUV max = 7.5. Legrand Rams this to represent  infection rather than neoplasm. There is a large layering right  pleural effusion without associated metabolic activity. No  hypermetabolic mediastinal lymph nodes.  Abdomen / Pelvis:No abnormal hypermetabolic activity within the  liver or adrenal glands. No hypermetabolic mediastinal or pelvic  lymph nodes. Interval resolution of hypermetabolic activity  associated with the inguinal lymph nodes. There is new intense  hypermetabolic activity in the ascending colon to the region of the  ileocecal valve. Favor this to represent physiologic  Skeleton:Review of bone windows demonstrates no aggressive osseous  lesions. In particular no activity within the thoracic or lumbar  spine to explain back pain which may relate to pneumonia  IMPRESSION:  1. Favor complete response to chemotherapy with no residual  metabolic activity within the cervical, axillary, mediastinal, or  inguinal lymph nodes.  2. New hypermetabolic activity within the left lower lobe  associated with  consolidative pattern within the superior segment  of the lower lobe. Legrand Rams this to represent a site of infection /  pneumonia.  3. Hypermetabolic activity within the cecum is felt to be  Physiologic.  B.  Follow up for lymphoma:  - See my NP Belenda Cruise in about 3 months. - repeat scan in about 6 months.  C.  Potential pneumonia left lower lung:   - Start 7 day-course of Levaquin.  D.  Back pain: - Unlikely due to cancer or its treatment. - Continue Morphine immediate release 15mg  by mouth every 6 hours as needed for pain. - continue Neurontin (aka Gabapentin) 100mg  by mouth once daily. - Please follow up with Dr. Caryl Never for further work up as appropriate.

## 2011-10-13 NOTE — Progress Notes (Signed)
[facility] OFFICE PROGRESS NOTE  Cc:  Colleen Covey, MD, MD  DIAGNOSIS:  Stage IV, low grade follicular lymphoma; FLIPI score of 3 or high risk (due to age >65; stage IV; and more than 4 areas of involved nodal areas).  Presenting beta-2 microglobulin of 2.42; LDH of 186.    PAST THERAPY: started q4 wk d1 Rituxan/Bendamustine; d2 Bendamustine; d3 Neulasta in Jan 2013; s/p 3 cycles; chemo stopped in March 2013 due to severe bone pain.    CURRENT THERAPY:  Watchful observation.   INTERVAL HISTORY: Colleen Cooke 77 y.o. female returns to clinic with her husband.  She still has mid back pain; bilateral, radiating from the spine to bilateral ribs; intermittent, worse with activity.  She has intermittent dry cough.  She denies pleurisy especially in the left side. She has very poor appetite and has not had improvement of her appetite with appetite stimulant yet.  Pain is described as "gnawing," 10/10 at time; partially relived with morphine immediate release.  She could not tolerate MS Contin due to over sedation.  She has extreme fatigue due to lack of sleep because of the pain.  She no longer feels any more residual adenopathy.  Her bilateral legs PVD edema has improved significantly.  She does have SOE and DOE on exertion walking more than 50 feet.   Patient denies headache, visual changes, confusion, drenching night sweats, palpable lymph node swelling, mucositis, odynophagia, dysphagia, nausea vomiting, jaundice, , palpitation, productive cough, gum bleeding, epistaxis, hematemesis, hemoptysis, abdominal pain, abdominal swelling, early satiety, melena, hematochezia, hematuria, skin rash, spontaneous bleeding, joint swelling, joint pain, heat or cold intolerance, bowel bladder incontinence, back pain, focal motor weakness, paresthesia, depression, suicidal or homocidal ideation, feeling hopelessness.     MEDICAL HISTORY: Past Medical History  Diagnosis Date  . Chicken pox   .  Hypertension   . 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 Back pain 10/13/2011    SURGICAL HISTORY:  Past Surgical History  Procedure Date  . Appendectomy     age 8  . Tonsillectomy     age 24    MEDICATIONS: Current Outpatient Prescriptions  Medication Sig Dispense Refill  . acyclovir (ZOVIRAX) 400 MG tablet Take 1 tablet (400 mg total) by mouth daily.  30 tablet  3  . Diphenhyd-Hydrocort-Nystatin SUSP Swish and spit 15 mLs 4 (four) times daily as needed (Swish/gargle and spit as needed for mouth soreness).  500 mL  2  . Hydrocodone-Acetaminophen 5-300 MG TABS       . loratadine (CLARITIN) 10 MG tablet Take 1 tablet (10 mg total) by mouth daily as needed for allergies.  90 tablet  3  . mirtazapine (REMERON) 7.5 MG tablet Take 1 tablet (7.5 mg total) by mouth at bedtime.  30 tablet  3  . morphine (MSIR) 15 MG tablet Take 1 tablet (15 mg total) by mouth every 6 (six) hours as needed for pain. Take 1 tablet every 4 hours  90 tablet  0  . naproxen sodium (ANAPROX) 220 MG tablet Take 220 mg by mouth 2 (two) times daily as needed.        . ondansetron (ZOFRAN) 8 MG tablet Take 1 tablet two times a day starting the day after chemo for 2 days. Then take 1 tablet two times a day as needed for nausea or vomiting.  30 tablet  1  . prochlorperazine (COMPAZINE) 10 MG tablet TAKE ONE TABLET BY MOUTH EVERY 6 HOURS AS NEEDED FOR NAUSEA FOR VOMITING OR  NAUSEA  60 tablet  3  . aspirin 81 MG tablet Take 160 mg by mouth daily.        . furosemide (LASIX) 40 MG tablet Take 1 tablet (40 mg total) by mouth daily.  30 tablet  3  . gabapentin (NEURONTIN) 100 MG capsule Take 1 capsule (100 mg total) by mouth 3 (three) times daily.  30 capsule  0  . levofloxacin (LEVAQUIN) 500 MG tablet Take 1 tablet (500 mg total) by mouth daily.  7 tablet  0  . lisinopril-hydrochlorothiazide (PRINZIDE,ZESTORETIC) 10-12.5 MG per tablet Take by mouth daily.       . potassium chloride SA (K-DUR,KLOR-CON) 20 MEQ tablet Take 1 tablet (20 mEq  total) by mouth daily.  30 tablet  3   No current facility-administered medications for this visit.   Facility-Administered Medications Ordered in Other Visits  Medication Dose Route Frequency Provider Last Rate Last Dose  . fludeoxyglucose F - 18 (FDG) injection 17.1 milli Curie  17.1 milli Curie Intravenous Once PRN Medication Radiologist, MD   17.1 milli Curie at 10/12/11 1337    ALLERGIES:  is allergic to penicillins.  REVIEW OF SYSTEMS:  The rest of the 14-point review of system was negative.   Filed Vitals:   10/13/11 0904  BP: 107/56  Pulse: 121  Temp: 96.7 F (35.9 C)   Wt Readings from Last 3 Encounters:  10/13/11 92 lb (41.731 kg)  10/06/11 95 lb (43.092 kg)  09/30/11 96 lb 8 oz (43.772 kg)   ECOG Performance status: 2  PHYSICAL EXAMINATION:   General:  Thin-appearing woman; chronically ill appearing. There was no increased work of breathing.  Eyes:  no scleral icterus.  ENT:  There were no oropharyngeal lesions.  Neck was without thyromegaly.  Lymphatics: I could not appreciate any palpable adenopathy on exam today.   Respiratory: clear breath sound bilaterally without decreased in tympany.  There was no rales or crackles.   Cardiovascular:  Tachy but regular rate and rhythm, S1/S2, without murmur, rub or gallop.  There was no pedal edema.  GI:  abdomen was soft, flat, nontender, nondistended, without organomegaly.  Muscoloskeletal:  There was no pain now on palpation of her back since she just took pain med before coming to clinic today.  Skin exam was without echymosis, petichae.  Neuro exam was nonfocal.  Patient was able to get on and off exam table without assistance.  Gait was normal.  Patient was alerted and oriented.  Attention was good.   Language was appropriate.  Mood was normal without depression.  Speech was not pressured.  Thought content was not tangential.      LABORATORY/RADIOLOGY DATA:  Lab Results  Component Value Date   WBC 17.6* 09/30/2011   HGB  10.6* 09/30/2011   HCT 32.5* 09/30/2011   PLT 400 09/30/2011   GLUCOSE 132* 09/30/2011   ALT 10 09/30/2011   AST 17 09/30/2011   NA 134* 09/30/2011   K 4.1 09/30/2011   CL 91* 09/30/2011   CREATININE 0.64 09/30/2011   BUN 13 09/30/2011   CO2 31 09/30/2011   TSH 1.64 06/09/2011    IMAGING:  PET Scan 10/12/2011.   I personally reviewed the following images and showed the patient and her husband the images.   Neck: There is marked reduction in hypermetabolic activity within  the right neck with no measurable hypermetabolic activity  remaining.  Chest: Interval resolution of metabolic active associated with the  right supraclavicular axillary lymph nodes. No  residual  hypermetabolic activity remains. There is intense metabolic  activity associated with a focus of consolidation in the superior  segment of the left lower lobe (image 85). This has intense  metabolic activity with SUV max = 7.5. Legrand Rams this to represent  infection rather than neoplasm. There is a large layering right  pleural effusion without associated metabolic activity. No  hypermetabolic mediastinal lymph nodes.  Abdomen / Pelvis:No abnormal hypermetabolic activity within the  liver or adrenal glands. No hypermetabolic mediastinal or pelvic  lymph nodes. Interval resolution of hypermetabolic activity  associated with the inguinal lymph nodes. There is new intense  hypermetabolic activity in the ascending colon to the region of the  ileocecal valve. Favor this to represent physiologic  Skeleton:Review of bone windows demonstrates no aggressive osseous  lesions. In particular no activity within the thoracic or lumbar  spine to explain back pain which may relate to pneumonia   IMPRESSION:  1. Favor complete response to chemotherapy with no residual  metabolic activity within the cervical, axillary, mediastinal, or  inguinal lymph nodes.  2. New hypermetabolic activity within the left lower lobe  associated with consolidative pattern  within the superior segment  of the lower lobe. Legrand Rams this to represent a site of infection /  pneumonia.  3. Hypermetabolic activity within the cecum is felt to be  physiologic.  Findings conveyed to C. Curcio nurse practitioner assistant to Dr.  Gaylyn Rong on 10/12/2011.      ASSESSMENT AND PLAN:   1. Right worse than left upper back pain: -Differential:  Unlikely due to lymphoma since PET scan did not show any residual activity.  Neulasta (GCSF) does not cause bone pain focally like this more than 10 days from its administration.  I query if there is radiculopathy, disc disease causing her pain.  I personally talked with Dr. Caryl Never who graciously agreed to take over the work up.  - Treatment:  She requested more pain med since she does not know when her appointment with Dr. Caryl Never will be.  I gave her Rx for morphine sulfate IR 15mg  PO q6 hours pain pain to tie over over.  I advised her that this will be the last pain Rx from me.  Her pain is unlikely due to cancer; and she needs to follow up with Dr. Caryl Never for work up and treatment as appropriate.    2. Right pleural effusion:  Pleural fluid was exudative; but cytology and culture were both negative.  It was negative on PET.  Given her SOB, and DOE, I will defer to Dr. Caryl Never to work up as appropriate (echo to r/u CHF?).  Her right-sided pleural effusion predated her lymphoma diagnosis and its treatment.   3. State IV, low grade, but high risk follicular lymphoma:  S/p 3 cycles of Bendamustine/Rituxan with complete response per PET scan.  She does not want to go on maintenance Rituxan due to her poor performance status. I agreed with her decision.   I advised her to stop Acyclovir since she is now done with chemotherapy Bendamustine.   4.  Skin rash:  Either from Allopurinol or Bendamustine.  It has resolved with discontinuation of Bendamustine.   5.  HTN:  Now no longer hypertensive due to weight loss.    6.  Grade 1 anemia:   From chemo. There is no active bleeding.  There is no need for pRBC transfusion.  This level of mild anemia does not account for her SOB, DOE.   7. Moderate  calorie-protein malnutrion and weight loss:  I prescribed Remeron 7.5 mg PO qhs.  Will continue this dose for now.   8. Follow up:  F/U with Belenda Cruise in about 3 months.     The length of time of the face-to-face encounter was 25 minutes. More than 50% of time was spent counseling and coordination of care.

## 2011-10-14 ENCOUNTER — Encounter: Payer: Self-pay | Admitting: Oncology

## 2011-10-14 ENCOUNTER — Ambulatory Visit: Payer: Medicare HMO

## 2011-10-14 ENCOUNTER — Telehealth: Payer: Self-pay | Admitting: Oncology

## 2011-10-14 NOTE — Telephone Encounter (Signed)
cx inj 4/22 per natalie  aom

## 2011-10-15 ENCOUNTER — Encounter: Payer: Self-pay | Admitting: Oncology

## 2011-10-15 ENCOUNTER — Ambulatory Visit: Payer: Medicare HMO

## 2011-10-15 NOTE — Progress Notes (Signed)
Patient approve for 100% Discount  10/14/11 - 10/13/12.

## 2011-10-19 ENCOUNTER — Ambulatory Visit: Payer: Medicare HMO | Admitting: Family Medicine

## 2011-10-20 ENCOUNTER — Telehealth: Payer: Self-pay | Admitting: *Deleted

## 2011-10-20 NOTE — Telephone Encounter (Signed)
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  Colleen Cooke made a visit this past weekend and they are very concerned about her nutrition.  Also Colleen Cooke said the Dr Colleen Cooke is no longer going to prescribe the morphine because he feels her back pain is not related to her cancer.    Big picture, the family up in [place] would like to have the couple move to [place] to assist with aging and medical issues.  They need to have her gain some strength to be able to do this.  Family has arranged for housekeeping to come in weekly, family asking for Home Health evaluation as she is c/o not being able to do her ADL for herself lately.    I did leave a message for [facility] after hours VM asking if Colleen Cooke would be eligible for their services.  Explained the pt and husband will be coming to office tomorrow 10:30 am for OV  Update Colleen Cooke phone #[phone]

## 2011-10-20 NOTE — Telephone Encounter (Signed)
Daughter in law would like to speak to [place].  States she had HIPPA approval, and we have the appropriate forms.

## 2011-10-20 NOTE — Telephone Encounter (Signed)
Discuss referral at office visit tomorrow.  Will be happy to get [facility] referral and will discuss possible thoracic MRI at visit tomorrow.

## 2011-10-20 NOTE — Telephone Encounter (Signed)
Pt is calling back and asking to speak to Colleen Cooke re: this pt's care.?

## 2011-10-21 ENCOUNTER — Encounter: Payer: Self-pay | Admitting: Family Medicine

## 2011-10-21 ENCOUNTER — Ambulatory Visit (INDEPENDENT_AMBULATORY_CARE_PROVIDER_SITE_OTHER): Payer: Medicare HMO | Admitting: Family Medicine

## 2011-10-21 VITALS — BP 110/62 | Temp 97.5°F | Wt 90.0 lb

## 2011-10-21 DIAGNOSIS — R634 Abnormal weight loss: Secondary | ICD-10-CM

## 2011-10-21 DIAGNOSIS — D649 Anemia, unspecified: Secondary | ICD-10-CM

## 2011-10-21 DIAGNOSIS — J90 Pleural effusion, not elsewhere classified: Secondary | ICD-10-CM

## 2011-10-21 DIAGNOSIS — C8590 Non-Hodgkin lymphoma, unspecified, unspecified site: Secondary | ICD-10-CM

## 2011-10-21 DIAGNOSIS — R609 Edema, unspecified: Secondary | ICD-10-CM

## 2011-10-21 DIAGNOSIS — C8589 Other specified types of non-Hodgkin lymphoma, extranodal and solid organ sites: Secondary | ICD-10-CM

## 2011-10-21 DIAGNOSIS — M549 Dorsalgia, unspecified: Secondary | ICD-10-CM

## 2011-10-21 LAB — CBC WITH DIFFERENTIAL/PLATELET
Basophils Absolute: 0 10*3/uL (ref 0.0–0.1)
Eosinophils Absolute: 0.1 10*3/uL (ref 0.0–0.7)
HCT: 32.2 % — ABNORMAL LOW (ref 36.0–46.0)
Hemoglobin: 10.7 g/dL — ABNORMAL LOW (ref 12.0–15.0)
Lymphs Abs: 0.4 10*3/uL — ABNORMAL LOW (ref 0.7–4.0)
MCHC: 33.1 g/dL (ref 30.0–36.0)
Monocytes Relative: 4.1 % (ref 3.0–12.0)
Neutro Abs: 6.5 10*3/uL (ref 1.4–7.7)
RDW: 16.2 % — ABNORMAL HIGH (ref 11.5–14.6)

## 2011-10-21 LAB — BASIC METABOLIC PANEL
BUN: 16 mg/dL (ref 6–23)
CO2: 29 mEq/L (ref 19–32)
Calcium: 9.9 mg/dL (ref 8.4–10.5)
Creatinine, Ser: 0.8 mg/dL (ref 0.4–1.2)

## 2011-10-21 LAB — HEPATIC FUNCTION PANEL
Albumin: 3.2 g/dL — ABNORMAL LOW (ref 3.5–5.2)
Bilirubin, Direct: 0.1 mg/dL (ref 0.0–0.3)
Total Protein: 6.5 g/dL (ref 6.0–8.3)

## 2011-10-21 MED ORDER — FENTANYL 25 MCG/HR TD PT72: 1.0000 | MEDICATED_PATCH | TRANSDERMAL | Status: DC

## 2011-10-21 MED ORDER — MIRTAZAPINE 15 MG PO TBDP: 15.0000 mg | ORAL_TABLET | Freq: Every day | ORAL | Status: AC

## 2011-10-21 NOTE — Progress Notes (Signed)
Subjective:    Patient ID: Colleen Cooke, female    DOB: 05/14/1935, 77 y.o.   MRN: [id number]  HPI  Patient seen for medical followup. Refer to prior notes. She has history of hypertension, peripheral leg edema, lymphoma which apparently is in remission from recent studies, right pleural effusion with prior thoracentesis revealing exudate but negative cytology and no hypermetabolic activity from recent PET scan. She has had somewhat migratory poorly localized back pain. Previous PET scan did not show any regional activity. Initially her pain was more right thoracic and now thoracic and lumbar and somewhat bilateral. She's had progressive loss of appetite and nausea which she attributes to her pain.  Placed per oncology on MS Contin but had extreme sedation. She tried hydrocodone initially but had increased sedation. Currently taking morphine sulfate immediate release 15 mg every 6 hours and she states this is not helping her pain at all. She has noted more relief with Tylenol. Pain is relatively constant but waxes and wanes in severity. Somewhat worse with movement. Denies any fever or chills. No cough. She's had some previous dyspnea this seems to be improving somewhat. Denies any stool changes.  Recently placed per oncology on Remeron 7.5 mg the husband has not noted much improvement in appetite. Still has some sleep difficulties off and on  She has anemia related to her prior chemotherapy. Prior labs also significant for albumin 3.4 and elevated alkaline phosphatase.  Past Medical History  Diagnosis Date  . Chicken pox   . Hypertension   . 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 Back pain 10/13/2011   Past Surgical History  Procedure Date  . Appendectomy     age 27  . Tonsillectomy     age 35    reports that she quit smoking about 17 years ago. Her smoking use included Cigarettes. She has a 20 pack-year smoking history. She has never used smokeless tobacco. She reports that she does not drink  alcohol or use illicit drugs. family history includes Coronary artery disease (age of onset:71) in her mother. Allergies  Allergen Reactions  . Penicillins     REACTION: swelling localized      Review of Systems  Constitutional: Positive for appetite change and unexpected weight change. Negative for fever, chills and fatigue.  HENT: Negative for trouble swallowing.   Eyes: Negative for visual disturbance.  Respiratory: Negative for cough and wheezing.   Cardiovascular: Positive for leg swelling (chronic and unchanged). Negative for chest pain and palpitations.  Gastrointestinal: Negative for nausea, vomiting, abdominal pain, diarrhea and blood in stool.  Genitourinary: Negative for dysuria.  Musculoskeletal: Positive for back pain. Negative for joint swelling.  Skin: Negative for rash.  Neurological: Positive for weakness. Negative for seizures, syncope and headaches.  Hematological: Negative for adenopathy. Does not bruise/bleed easily.  Psychiatric/Behavioral: Negative for confusion.       Objective:   Physical Exam  Constitutional: She is oriented to person, place, and time.       Alert thin somewhat cachectic appearing 77 year old female  HENT:  Right Ear: External ear normal.  Left Ear: External ear normal.  Mouth/Throat: Oropharynx is clear and moist.  Neck: Neck supple.  Cardiovascular: Normal rate and regular rhythm.   Pulmonary/Chest:       Diminished breath sounds right base related to her pleural effusion. No wheezes. No rales.  Abdominal: Soft. She exhibits no distension. There is no tenderness.  Musculoskeletal: She exhibits edema.       Trace to 1+  pitting edema lower legs bilaterally She has some nonspecific tenderness lower lumbar region bilaterally and somewhat thoracic region bilaterally. Minimal mid thoracic tenderness to palpation.  Lymphadenopathy:    She has no cervical adenopathy.  Neurological: She is alert and oriented to person, place, and time.  No cranial nerve deficit.       Generally weak and debilitated but no focal weakness  Skin: No rash noted.  Psychiatric: She has a normal mood and affect.          Assessment & Plan:  #1 progressive thoracic and lumbar back pain. Previous PET scan did not show metabolic activity in this region. No evidence for active lymphoma by recent PET scan. Obtain MRI scan thoracic and lumbar spine to further evaluate.  Poor pain control with morphine sulfate. Discontinued and start Duragesic patch 25 mcg every 3 days and will titrate as necessary #2 anorexia and weight loss. Question related to pain. She has evidence for some malnutrition with recent albumin 3.4. We've offered nutrition consult at this point they wish to wait. We've given suggestions for increasing calorie and protein intake.  Increase Remeron to 15 mg once daily. Continue calorie supplement with Boost. #3 history of lymphoma status post treatment with no evidence for active disease per recent evaluation #4 history of right pleural effusion with previous thoracentesis with negative cytology. If anything lung symptoms seem improved-less cough and dypsnea. Consider pulmonary referral she has any recurrent dyspnea. #5 history of anemia probably related to her chemotherapy. Repeat CBC

## 2011-10-21 NOTE — Patient Instructions (Signed)
STOP morphine  START Duragesic patch one every 72 hours.

## 2011-10-22 ENCOUNTER — Ambulatory Visit: Payer: Medicare HMO | Admitting: Family Medicine

## 2011-10-28 ENCOUNTER — Ambulatory Visit (INDEPENDENT_AMBULATORY_CARE_PROVIDER_SITE_OTHER): Payer: Medicare HMO | Admitting: Family Medicine

## 2011-10-28 ENCOUNTER — Telehealth: Payer: Self-pay | Admitting: *Deleted

## 2011-10-28 ENCOUNTER — Encounter: Payer: Self-pay | Admitting: Family Medicine

## 2011-10-28 VITALS — BP 120/70 | Temp 97.3°F | Wt 94.0 lb

## 2011-10-28 DIAGNOSIS — M549 Dorsalgia, unspecified: Secondary | ICD-10-CM

## 2011-10-28 DIAGNOSIS — C8590 Non-Hodgkin lymphoma, unspecified, unspecified site: Secondary | ICD-10-CM

## 2011-10-28 DIAGNOSIS — C8589 Other specified types of non-Hodgkin lymphoma, extranodal and solid organ sites: Secondary | ICD-10-CM

## 2011-10-28 MED ORDER — FENTANYL 50 MCG/HR TD PT72: 1.0000 | MEDICATED_PATCH | TRANSDERMAL | Status: DC

## 2011-10-28 NOTE — Patient Instructions (Signed)
Increase Duragesic to 50 mcg every 3 days Increase Mirtazapine to 15 mg daily

## 2011-10-28 NOTE — Telephone Encounter (Signed)
Pt called to make sure her appts for chemo in May were canceled.  I sent POF to scheduling to cancel appts in May.  Confirm w/ pt she is to return to office in July.  She verbalized understanding.

## 2011-10-28 NOTE — Progress Notes (Signed)
  Subjective:    Patient ID: Colleen Cooke, female    DOB: 19-Nov-1934, 77 y.o.   MRN: [id number]  HPI  Medical followup. Refer to prior note. She had substantial weight loss and ongoing back pain somewhat migratory initially thoracic and now lumbar and thoracic. No radiculopathy symptoms. She was not doing well with morphine and we switched her to Duragesic patch 25 mcg every 3 days. She states her pain is better lying flat does still occasionally 10 out of 10 sitting up. She supplements with Tylenol which interestingly seems to relieve somewhat. No side effects from Duragesic.  She's gained 4 pounds of weight. We increased her Remeron to 15 mg at night. She has good appetite. No fevers or night sweats. No vomiting or diarrhea.  Patient has not yet had MRI scans of the thoracic and lumbar spine which have been ordered. She had been scheduled but canceled her appointment and the studies have been rescheduled for May 20. No progressive or focal LE weakness.  No stool or urine incontinence.  Past Medical History  Diagnosis Date  . Chicken pox   . Hypertension   . 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 Back pain 10/13/2011   Past Surgical History  Procedure Date  . Appendectomy     age 35  . Tonsillectomy     age 68    reports that she quit smoking about 17 years ago. Her smoking use included Cigarettes. She has a 20 pack-year smoking history. She has never used smokeless tobacco. She reports that she does not drink alcohol or use illicit drugs. family history includes Coronary artery disease (age of onset:71) in her mother. Allergies  Allergen Reactions  . Penicillins     REACTION: swelling localized      Review of Systems  Constitutional: Positive for fatigue. Negative for fever, chills and appetite change.  Respiratory: Negative for cough and shortness of breath.   Cardiovascular: Negative for chest pain.  Genitourinary: Negative for dysuria.  Musculoskeletal: Positive for back pain.    Neurological: Positive for weakness. Negative for dizziness and syncope.  Hematological: Negative for adenopathy.       Objective:   Physical Exam  Constitutional: She is oriented to person, place, and time. She appears well-developed and well-nourished.  HENT:  Mouth/Throat: Oropharynx is clear and moist.  Neck: Neck supple. No thyromegaly present.  Cardiovascular: Normal rate and regular rhythm.   Pulmonary/Chest: Effort normal.       Still has slightly diminished aeration right base but clear  Musculoskeletal:       Only very minimal trace edema legs bilaterally  Neurological: She is alert and oriented to person, place, and time.          Assessment & Plan:  #1 chronic back pain thoracic and lumbar. Poor pain control.  MRI scans pending. Titrate Duragesic to 50 mcg every 3 days. New prescription written.  #2 recent weight loss. She's gained 4 pounds since last visit and this does not appear to be edematous. Continue Remeron 15 mg at night.  #3 recent lymphoma in remission by recent PET scan

## 2011-10-29 ENCOUNTER — Other Ambulatory Visit: Payer: Self-pay | Admitting: Oncology

## 2011-10-29 ENCOUNTER — Telehealth: Payer: Self-pay | Admitting: Oncology

## 2011-10-29 NOTE — Telephone Encounter (Signed)
per pof 05/02 cancelled appts for 05/15-05/17

## 2011-11-01 ENCOUNTER — Telehealth: Payer: Self-pay | Admitting: Family Medicine

## 2011-11-01 NOTE — Telephone Encounter (Signed)
[facility] has gone out to the pt's home. Now calling with an update. She would like one of you to call her back to discuss. She also has a question about the pt's Aspirin dose.

## 2011-11-01 NOTE — Telephone Encounter (Signed)
Yes, she can, but make sure not taking both the duragesic patch and morphine.

## 2011-11-01 NOTE — Telephone Encounter (Signed)
Pt informed and voiced her understanding 

## 2011-11-01 NOTE — Telephone Encounter (Signed)
Would only do 81 mg

## 2011-11-01 NOTE — Telephone Encounter (Signed)
Pt and [facility] informed 81 mg asa daily Pt reports the pain patch is not available (back-ordered) at pharmacy for 10-12 days.  Pt also reports it was not touching the pain.  Today she tool tylenol and "feels pretty good today".  Pt asking if the pain gets bad again, can she go back to taking morphine 15 mg, 1 tab every 6 hours prn pain.

## 2011-11-01 NOTE — Telephone Encounter (Signed)
I spoke with Donn Pierini from [facility], she visited pt last Friday, calling with an update.  Pt had gained 4 pounds and was feeling good about that, her appetite remains better.  She is still C/O back pain and has MRI scheduled for later this month.   Donn Pierini has a question, reports pt has not been taking ASA 81 mg, the sig on the asa is 160 mg daily.  Pt will start back on ASA, questioning if she is to take 1 or 2 tabs.

## 2011-11-04 ENCOUNTER — Ambulatory Visit
Admission: RE | Admit: 2011-11-04 | Discharge: 2011-11-04 | Disposition: A | Discharge status: Home / Self Care | Payer: Medicare HMO | Source: Ambulatory Visit | Attending: Family Medicine | Admitting: Family Medicine

## 2011-11-04 DIAGNOSIS — C8590 Non-Hodgkin lymphoma, unspecified, unspecified site: Secondary | ICD-10-CM

## 2011-11-04 DIAGNOSIS — R634 Abnormal weight loss: Secondary | ICD-10-CM

## 2011-11-05 ENCOUNTER — Other Ambulatory Visit: Payer: Self-pay | Admitting: *Deleted

## 2011-11-05 MED ORDER — ALENDRONATE SODIUM 70 MG PO TABS: 70.0000 mg | ORAL_TABLET | ORAL | Status: DC

## 2011-11-05 NOTE — Progress Notes (Signed)
Quick Note:  Fosamax sent to pharmacy ______

## 2011-11-10 ENCOUNTER — Other Ambulatory Visit: Payer: Medicare HMO

## 2011-11-10 ENCOUNTER — Ambulatory Visit: Payer: Medicare HMO

## 2011-11-10 ENCOUNTER — Ambulatory Visit: Payer: Medicare HMO | Admitting: Oncology

## 2011-11-10 ENCOUNTER — Encounter: Payer: Medicare HMO | Admitting: Nutrition

## 2011-11-11 ENCOUNTER — Encounter: Payer: Self-pay | Admitting: Family Medicine

## 2011-11-11 ENCOUNTER — Ambulatory Visit (INDEPENDENT_AMBULATORY_CARE_PROVIDER_SITE_OTHER): Payer: Medicare HMO | Admitting: Family Medicine

## 2011-11-11 ENCOUNTER — Ambulatory Visit: Payer: Medicare HMO

## 2011-11-11 VITALS — BP 130/68 | Temp 97.4°F | Wt 92.0 lb

## 2011-11-11 DIAGNOSIS — R634 Abnormal weight loss: Secondary | ICD-10-CM

## 2011-11-11 DIAGNOSIS — R609 Edema, unspecified: Secondary | ICD-10-CM

## 2011-11-11 DIAGNOSIS — S32009A Unspecified fracture of unspecified lumbar vertebra, initial encounter for closed fracture: Secondary | ICD-10-CM

## 2011-11-11 DIAGNOSIS — M4854XA Collapsed vertebra, not elsewhere classified, thoracic region, initial encounter for fracture: Secondary | ICD-10-CM

## 2011-11-11 DIAGNOSIS — M8448XA Pathological fracture, other site, initial encounter for fracture: Secondary | ICD-10-CM

## 2011-11-11 DIAGNOSIS — S32000A Wedge compression fracture of unspecified lumbar vertebra, initial encounter for closed fracture: Secondary | ICD-10-CM

## 2011-11-11 NOTE — Progress Notes (Signed)
  Subjective:    Patient ID: Colleen Cooke, female    DOB: 14-Sep-1934, 77 y.o.   MRN: [id number]  HPI  Followup severe back pain. We obtained MRI scan thoracic and lumbar spine as she had acute or subacute fractures of T8, T12, L2, and L3. She's taking now calcium and vitamin D we started Fosamax. She is tolerating well no side effects. Her bone pain in the back is greatly improved over the past week. She is ambulating better. She still taking one morphine tablet daily this controlled her pain fairly well. No recent fall. She also had some improvement appetite though weight has not increased.  She is sleeping better on Remeron. Mood is improved. She denies any cough. Minimal dyspnea with exertion. Minimal peripheral edema stable. Recent albumin 3.2 but appetite has improved greatly over the past couple of weeks since increasing Remeron  Past Medical History  Diagnosis Date  . Chicken pox   . Hypertension   . 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 Back pain 10/13/2011   Past Surgical History  Procedure Date  . Appendectomy     age 49  . Tonsillectomy     age 89    reports that she quit smoking about 17 years ago. Her smoking use included Cigarettes. She has a 20 pack-year smoking history. She has never used smokeless tobacco. She reports that she does not drink alcohol or use illicit drugs. family history includes Coronary artery disease (age of onset:71) in her mother. Allergies  Allergen Reactions  . Penicillins     REACTION: swelling localized      Review of Systems  Constitutional: Negative for fever, chills and unexpected weight change.  HENT: Negative for congestion and trouble swallowing.   Gastrointestinal: Negative for abdominal pain.  Genitourinary: Negative for dysuria.  Musculoskeletal: Positive for back pain.  Neurological: Negative for dizziness, syncope and headaches.  Hematological: Negative for adenopathy.       Objective:   Physical Exam  Constitutional: She is  oriented to person, place, and time.       Thin somewhat frail appearing 77 year old female  HENT:       Slightly dry otherwise clear  Cardiovascular: Normal rate and regular rhythm.   Pulmonary/Chest: Effort normal and breath sounds normal.  Musculoskeletal: She exhibits edema.  Neurological: She is alert and oriented to person, place, and time.          Assessment & Plan:  #1 multiple compression fractures thoracic and lumbar spine. These do not appear pathologic. No trauma hx.  She has obvious osteoporosis based on above. We discussed DEXA scanning but this would not change any treatment at this point. Continue calcium /vitamin D. Continue Fosamax 70 mg weekly. Reassess one month. Her pain is greatly improved. Supplement with morphine as needed #2 weight loss. Stable with symptomatic improvement appetite. Reassess one month. Check albumin status then.  Remeron might be helping . #3 history recent lymphoma in remission by recent PET scan

## 2011-11-12 ENCOUNTER — Ambulatory Visit: Payer: Medicare HMO

## 2011-11-15 ENCOUNTER — Other Ambulatory Visit: Payer: Medicare HMO

## 2011-12-08 ENCOUNTER — Ambulatory Visit (INDEPENDENT_AMBULATORY_CARE_PROVIDER_SITE_OTHER): Payer: Medicare HMO | Admitting: Family Medicine

## 2011-12-08 ENCOUNTER — Encounter: Payer: Self-pay | Admitting: Family Medicine

## 2011-12-08 VITALS — BP 140/80 | Temp 98.1°F | Wt 95.0 lb

## 2011-12-08 DIAGNOSIS — M4854XA Collapsed vertebra, not elsewhere classified, thoracic region, initial encounter for fracture: Secondary | ICD-10-CM

## 2011-12-08 DIAGNOSIS — E8809 Other disorders of plasma-protein metabolism, not elsewhere classified: Secondary | ICD-10-CM

## 2011-12-08 DIAGNOSIS — S32000A Wedge compression fracture of unspecified lumbar vertebra, initial encounter for closed fracture: Secondary | ICD-10-CM

## 2011-12-08 DIAGNOSIS — S32009A Unspecified fracture of unspecified lumbar vertebra, initial encounter for closed fracture: Secondary | ICD-10-CM

## 2011-12-08 DIAGNOSIS — M8448XA Pathological fracture, other site, initial encounter for fracture: Secondary | ICD-10-CM

## 2011-12-08 LAB — HEPATIC FUNCTION PANEL
ALT: 30 U/L (ref 0–35)
Total Bilirubin: 0.2 mg/dL — ABNORMAL LOW (ref 0.3–1.2)
Total Protein: 6.3 g/dL (ref 6.0–8.3)

## 2011-12-08 NOTE — Progress Notes (Signed)
  Subjective:    Patient ID: Colleen Cooke, female    DOB: 1934-09-05, 77 y.o.   MRN: [id number]  HPI  Patient seen for followup. Lymphoma in remission.  She's had multiple nonpathologic compression fractures spine these are slowly healing. She still has pain takes about one morphine tablet daily but overall greatly improved. She had substantial weight loss and has responded well to Remeron and increased appetite. 3 pound weight gain since last visit. Sleeping fairly well. She is now on Fosamax and tolerating well-no GERD issues. Taking vitamin D and calcium.  No history of documented Pneumovax. She does get yearly flu vaccines.  Past Medical History  Diagnosis Date  . Chicken pox   . Hypertension   . 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 Back pain 10/13/2011   Past Surgical History  Procedure Date  . Appendectomy     age 49  . Tonsillectomy     age 44    reports that she quit smoking about 17 years ago. Her smoking use included Cigarettes. She has a 20 pack-year smoking history. She has never used smokeless tobacco. She reports that she does not drink alcohol or use illicit drugs. family history includes Coronary artery disease (age of onset:71) in her mother. Allergies  Allergen Reactions  . Penicillins     REACTION: swelling localized      Review of Systems  Constitutional: Negative for fever, chills and appetite change.  Respiratory: Negative for shortness of breath.   Cardiovascular: Negative for chest pain.  Genitourinary: Negative for dysuria.  Neurological: Negative for dizziness and headaches.       Objective:   Physical Exam  Constitutional: She is oriented to person, place, and time.       Alert thin female in no distress  Neck: Neck supple. No thyromegaly present.  Cardiovascular: Normal rate and regular rhythm.   Pulmonary/Chest: Effort normal and breath sounds normal. No respiratory distress. She has no wheezes. She has no rales.  Musculoskeletal: She exhibits  edema.       Trace edema both legs which is chronic and unchanged  Neurological: She is alert and oriented to person, place, and time.          Assessment & Plan:  #1 history of protein calorie malnutrition. Recheck albumin. Continue Remeron for depression and appetite issues which seems to be helping  #2 osteoporosis with multiple compression fractures thoracic and lumbar spine. Symptomatically improving. Continue Fosamax #3 compression vertebral fractures -pain improving.

## 2011-12-09 NOTE — Progress Notes (Signed)
Quick Note:  Pt informed ______ 

## 2011-12-15 ENCOUNTER — Telehealth: Payer: Self-pay | Admitting: Family Medicine

## 2011-12-15 DIAGNOSIS — Z9181 History of falling: Secondary | ICD-10-CM

## 2011-12-15 NOTE — Telephone Encounter (Signed)
I do not see a referral to Physical Therapy in chart, perhaps I'm looking in the wrong area.  I explained to pt the referral for husband to Physical Therapy for "balance" issues would need a diagnosis on record for insurance purposes.  We will look into and call them back

## 2011-12-15 NOTE — Telephone Encounter (Signed)
Pt informed.  If husband needs some PT also for balance issues, really needs assessment, OV to document.  Pt voiced her understanding

## 2011-12-15 NOTE — Telephone Encounter (Signed)
I do not see either.  I will go ahead and set up.  This might have been over looked.  Let pt know we are setting up.

## 2011-12-15 NOTE — Telephone Encounter (Signed)
Pt stating she was supposed to be referred to [facility] PT, I do not see a referral in the system. Pt also requesting her husband be sent as well .please contact pt. Pt requesting to be contacted in the afternoon

## 2011-12-22 ENCOUNTER — Ambulatory Visit: Payer: Medicare HMO | Attending: Family Medicine

## 2011-12-22 DIAGNOSIS — M546 Pain in thoracic spine: Secondary | ICD-10-CM | POA: Insufficient documentation

## 2011-12-22 DIAGNOSIS — R262 Difficulty in walking, not elsewhere classified: Secondary | ICD-10-CM | POA: Insufficient documentation

## 2011-12-22 DIAGNOSIS — M545 Low back pain, unspecified: Secondary | ICD-10-CM | POA: Insufficient documentation

## 2011-12-22 DIAGNOSIS — M6281 Muscle weakness (generalized): Secondary | ICD-10-CM | POA: Insufficient documentation

## 2011-12-22 DIAGNOSIS — IMO0001 Reserved for inherently not codable concepts without codable children: Secondary | ICD-10-CM | POA: Insufficient documentation

## 2011-12-28 ENCOUNTER — Ambulatory Visit: Payer: Medicare HMO | Attending: Family Medicine

## 2011-12-28 DIAGNOSIS — R262 Difficulty in walking, not elsewhere classified: Secondary | ICD-10-CM | POA: Insufficient documentation

## 2011-12-28 DIAGNOSIS — M545 Low back pain, unspecified: Secondary | ICD-10-CM | POA: Insufficient documentation

## 2011-12-28 DIAGNOSIS — M546 Pain in thoracic spine: Secondary | ICD-10-CM | POA: Insufficient documentation

## 2011-12-28 DIAGNOSIS — M6281 Muscle weakness (generalized): Secondary | ICD-10-CM | POA: Insufficient documentation

## 2011-12-28 DIAGNOSIS — IMO0001 Reserved for inherently not codable concepts without codable children: Secondary | ICD-10-CM | POA: Insufficient documentation

## 2011-12-29 ENCOUNTER — Ambulatory Visit: Payer: Medicare HMO | Admitting: Physical Therapy

## 2011-12-31 ENCOUNTER — Encounter: Payer: Medicare HMO | Admitting: Physical Therapy

## 2012-01-04 ENCOUNTER — Ambulatory Visit: Payer: Medicare HMO

## 2012-01-06 ENCOUNTER — Ambulatory Visit: Payer: Medicare HMO | Admitting: Physical Therapy

## 2012-01-11 ENCOUNTER — Ambulatory Visit: Payer: Medicare HMO | Admitting: Physical Therapy

## 2012-01-12 ENCOUNTER — Ambulatory Visit: Payer: Medicare HMO | Admitting: Oncology

## 2012-01-12 ENCOUNTER — Other Ambulatory Visit: Payer: Medicare HMO | Admitting: Lab

## 2012-01-13 ENCOUNTER — Ambulatory Visit: Payer: Medicare HMO | Admitting: Physical Therapy

## 2012-01-18 ENCOUNTER — Ambulatory Visit: Payer: Medicare HMO | Admitting: Physical Therapy

## 2012-01-20 ENCOUNTER — Ambulatory Visit: Payer: Medicare HMO | Admitting: Physical Therapy

## 2012-01-24 ENCOUNTER — Telehealth: Payer: Self-pay | Admitting: *Deleted

## 2012-01-24 NOTE — Telephone Encounter (Signed)
Read Dr. Lodema Pilot last progress note states he is deferring pt's back pain management along w/ management of back pain to dr. Caryl Never.  Called pt back and instructed her to call dr. Caryl Never for any back pain and meds r/t back pain.  Instructed her to call us back if any other new problems/symptoms or concerns.  She verbalized understanding and will call Dr. Caryl Never for the morphine request.

## 2012-01-24 NOTE — Telephone Encounter (Signed)
Pt requesting refill on MS IR 15 mg tabs.  States has not taken MS contin in several months and only taking IR about once or twice a day for her ongoing back pain.  Says pain is due to fractures, Possibly

## 2012-01-25 ENCOUNTER — Telehealth: Payer: Self-pay | Admitting: Family Medicine

## 2012-01-25 ENCOUNTER — Ambulatory Visit: Payer: Medicare HMO | Admitting: Physical Therapy

## 2012-01-25 DIAGNOSIS — M898X9 Other specified disorders of bone, unspecified site: Secondary | ICD-10-CM

## 2012-01-25 DIAGNOSIS — R630 Anorexia: Secondary | ICD-10-CM

## 2012-01-25 DIAGNOSIS — R634 Abnormal weight loss: Secondary | ICD-10-CM

## 2012-01-25 DIAGNOSIS — C8590 Non-Hodgkin lymphoma, unspecified, unspecified site: Secondary | ICD-10-CM

## 2012-01-25 NOTE — Telephone Encounter (Signed)
OK to refill once.  I would like her to try to use sparingly and she notified us her pain was improving last visit.

## 2012-01-25 NOTE — Telephone Encounter (Signed)
Patient came in stating that the MD told her that he would write her an rx for morphine sul 15 mg tab rox that was originally written by Dr. Gaylyn Rong. Please advise and inform patient when available for pick up.

## 2012-01-26 MED ORDER — MORPHINE SULFATE 15 MG PO TABS: 15.0000 mg | ORAL_TABLET | Freq: Four times a day (QID) | ORAL | Status: DC | PRN

## 2012-01-26 NOTE — Telephone Encounter (Signed)
Pt informed she should try to use sparingly.  She reports she will often take one in the morning and it lasts her all day. FYI only

## 2012-01-27 ENCOUNTER — Ambulatory Visit: Payer: Medicare HMO | Attending: Family Medicine

## 2012-01-27 DIAGNOSIS — R262 Difficulty in walking, not elsewhere classified: Secondary | ICD-10-CM | POA: Insufficient documentation

## 2012-01-27 DIAGNOSIS — M545 Low back pain, unspecified: Secondary | ICD-10-CM | POA: Insufficient documentation

## 2012-01-27 DIAGNOSIS — M546 Pain in thoracic spine: Secondary | ICD-10-CM | POA: Insufficient documentation

## 2012-01-27 DIAGNOSIS — IMO0001 Reserved for inherently not codable concepts without codable children: Secondary | ICD-10-CM | POA: Insufficient documentation

## 2012-01-27 DIAGNOSIS — M6281 Muscle weakness (generalized): Secondary | ICD-10-CM | POA: Insufficient documentation

## 2012-02-01 ENCOUNTER — Ambulatory Visit: Payer: Medicare HMO

## 2012-02-03 ENCOUNTER — Ambulatory Visit: Payer: Medicare HMO | Admitting: Physical Therapy

## 2012-02-08 ENCOUNTER — Ambulatory Visit: Payer: Medicare HMO | Admitting: Physical Therapy

## 2012-02-10 ENCOUNTER — Ambulatory Visit: Payer: Medicare HMO | Admitting: Physical Therapy

## 2012-02-14 ENCOUNTER — Telehealth: Payer: Self-pay | Admitting: *Deleted

## 2012-02-14 NOTE — Telephone Encounter (Signed)
Colleen Cooke a nurse from [employer] is calling with an update on Colleen Cooke.  She did a home visit on 01/28/12 and the patient states that her pain has improved, she felt stronger, the PT has helped, and she was drinking her Boost.  Patient agreed to continue and follow up with Dr Caryl Never and the next office visit.  Colleen Cooke plans to follow up on a quarterly basis or earlier if needed.  Her number is [phone].

## 2012-02-15 ENCOUNTER — Ambulatory Visit: Payer: Medicare HMO | Admitting: Physical Therapy

## 2012-02-17 ENCOUNTER — Ambulatory Visit: Payer: Medicare HMO | Admitting: Physical Therapy

## 2012-02-22 ENCOUNTER — Ambulatory Visit: Payer: Medicare HMO | Admitting: Physical Therapy

## 2012-02-24 ENCOUNTER — Ambulatory Visit: Payer: Medicare HMO | Admitting: Physical Therapy

## 2012-02-25 ENCOUNTER — Telehealth: Payer: Self-pay | Admitting: Family Medicine

## 2012-02-25 MED ORDER — MORPHINE SULFATE 15 MG PO TABS: 15.0000 mg | ORAL_TABLET | Freq: Four times a day (QID) | ORAL | Status: DC | PRN

## 2012-02-25 NOTE — Telephone Encounter (Signed)
Our records show PC from pt requesting Dr Caryl Never begin filling Morphine dated 01/25/12, so OK to refill #30, pt informed ready to pick-up, pt needs to sign controled substance contract, per Dr Caryl Never, she should not be taking on a regular basis.

## 2012-02-25 NOTE — Telephone Encounter (Signed)
Pt informed ready to pick up

## 2012-02-25 NOTE — Telephone Encounter (Signed)
Pt came by to check to see if refill on Morphine Sulfate was ready. Pt req to pick up today asap.

## 2012-02-25 NOTE — Telephone Encounter (Signed)
Last refill 7/31 states per Dr Gaylyn Rong.  We need to clarify if he has filled

## 2012-02-25 NOTE — Telephone Encounter (Signed)
I called pt pharmacy, female pharmacist reports on 01/27/12 pt brought in Rx for Morphine from Dr Caryl Never, #30 with 0 refills.  Pt received #90 from Dr Gaylyn Rong on 01-27-12.

## 2012-02-25 NOTE — Telephone Encounter (Signed)
Pt requesting refill on MORPHINE SULFATE 15 MG PO TABS. Pt is taking last dose on MOnday

## 2012-03-09 ENCOUNTER — Ambulatory Visit (INDEPENDENT_AMBULATORY_CARE_PROVIDER_SITE_OTHER): Payer: Medicare HMO | Admitting: Family Medicine

## 2012-03-09 ENCOUNTER — Encounter: Payer: Self-pay | Admitting: Family Medicine

## 2012-03-09 VITALS — BP 140/70 | Temp 98.0°F | Wt 95.0 lb

## 2012-03-09 DIAGNOSIS — M4854XA Collapsed vertebra, not elsewhere classified, thoracic region, initial encounter for fracture: Secondary | ICD-10-CM

## 2012-03-09 DIAGNOSIS — R599 Enlarged lymph nodes, unspecified: Secondary | ICD-10-CM

## 2012-03-09 DIAGNOSIS — C8589 Other specified types of non-Hodgkin lymphoma, extranodal and solid organ sites: Secondary | ICD-10-CM

## 2012-03-09 DIAGNOSIS — M8448XA Pathological fracture, other site, initial encounter for fracture: Secondary | ICD-10-CM

## 2012-03-09 DIAGNOSIS — C8590 Non-Hodgkin lymphoma, unspecified, unspecified site: Secondary | ICD-10-CM

## 2012-03-09 DIAGNOSIS — R591 Generalized enlarged lymph nodes: Secondary | ICD-10-CM

## 2012-03-09 LAB — CBC WITH DIFFERENTIAL/PLATELET
Eosinophils Relative: 2.5 % (ref 0.0–5.0)
HCT: 36.8 % (ref 36.0–46.0)
Hemoglobin: 12.1 g/dL (ref 12.0–15.0)
Lymphs Abs: 0.7 10*3/uL (ref 0.7–4.0)
Monocytes Relative: 12.7 % — ABNORMAL HIGH (ref 3.0–12.0)
Neutro Abs: 3.4 10*3/uL (ref 1.4–7.7)
RBC: 3.64 Mil/uL — ABNORMAL LOW (ref 3.87–5.11)
WBC: 4.9 10*3/uL (ref 4.5–10.5)

## 2012-03-09 LAB — HEPATIC FUNCTION PANEL
ALT: 21 U/L (ref 0–35)
AST: 30 U/L (ref 0–37)
Albumin: 4.3 g/dL (ref 3.5–5.2)
Total Bilirubin: 0.6 mg/dL (ref 0.3–1.2)

## 2012-03-09 LAB — BASIC METABOLIC PANEL
GFR: 76.51 mL/min (ref >60.00)
Potassium: 5 mEq/L (ref 3.5–5.1)
Sodium: 136 mEq/L (ref 135–145)

## 2012-03-09 NOTE — Progress Notes (Signed)
  Subjective:    Patient ID: Colleen Cooke, female    DOB: Jun 18, 1935, 77 y.o.   MRN: [id number]  HPI  Medical followup. Patient has history of hypertension, lymphoma, vertebral compression fractures. Her back pain overall is improved she still has some intermittent pain. She's taking controlled release morphine 1 daily which seems to help and is adequately controlling her pain. Her lymphoma had been in remission but she did notice lymph node just yesterday below right ear. Overall she is feeling well. She has completed almost 2 months of physical therapy and feels stronger. Her appetite is good though her weight is unchanged from last visit. We started Remeron which she thinks has helped her appetite somewhat.  She has has had previous multiple vertebral compression fractures. Currently taking calcium, vitamin D, and Fosamax. No intolerance with Fosamax. No recent falls.  Past Medical History  Diagnosis Date  . Chicken pox   . Hypertension   . 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 Back pain 10/13/2011   Past Surgical History  Procedure Date  . Appendectomy     age 63  . Tonsillectomy     age 10    reports that she quit smoking about 18 years ago. Her smoking use included Cigarettes. She has a 20 pack-year smoking history. She has never used smokeless tobacco. She reports that she does not drink alcohol or use illicit drugs. family history includes Coronary artery disease (age of onset:71) in her mother. Allergies  Allergen Reactions  . Penicillins     REACTION: swelling localized      Review of Systems  Constitutional: Negative for fever, chills, appetite change and unexpected weight change.  Respiratory: Negative for cough and shortness of breath.   Cardiovascular: Negative for chest pain.  Gastrointestinal: Negative for abdominal pain.  Genitourinary: Negative for dysuria.  Musculoskeletal: Positive for back pain.  Neurological: Negative for dizziness, syncope and weakness.    Hematological: Positive for adenopathy. Does not bruise/bleed easily.  Psychiatric/Behavioral: Negative for confusion.       Objective:   Physical Exam  Constitutional: She is oriented to person, place, and time. She appears well-developed and well-nourished.  HENT:  Right Ear: External ear normal.  Left Ear: External ear normal.  Mouth/Throat: Oropharynx is clear and moist.  Neck: Neck supple.       Patient has lymph node approximately 2 x 2 centimeters just below right ear near angle of the mandible. Also noted palpable lymph node right MID anterior cervical triangle. She also has node right supraclavicular region  No axillary adenopathy  Cardiovascular: Normal rate and regular rhythm.  Exam reveals no gallop.   Pulmonary/Chest: Effort normal and breath sounds normal. No respiratory distress. She has no wheezes. She has no rales.  Musculoskeletal: She exhibits no edema.  Neurological: She is alert and oriented to person, place, and time.          Assessment & Plan:  #1 history of lymphoma. She has some worrisome recurrent lymphadenopathy right neck including right supraclavicular region. Referral promptly back to Dr. Gaylyn Rong her oncologist #2 history of osteoporosis with vertebral compression fractures. Continue calcium, vitamin D, and Fosamax. She's trying to limit morphine for pain control.

## 2012-03-10 ENCOUNTER — Other Ambulatory Visit: Payer: Self-pay | Admitting: *Deleted

## 2012-03-10 NOTE — Progress Notes (Signed)
Quick Note:  Pt informed ______ 

## 2012-03-13 ENCOUNTER — Telehealth: Payer: Self-pay | Admitting: *Deleted

## 2012-03-13 NOTE — Telephone Encounter (Signed)
Pt left VM states she needs to schedule appt to see Dr. Gaylyn Rong again for some "new lumps."   Called pt back and informed Dr. Gaylyn Rong sent order to scheduling to get pt scheduled here w/i next 2 weeks.  Instructed pt to call tomorrow and ask for Scheduling if she still has not heard anything.  She verbalized understanding.

## 2012-03-14 ENCOUNTER — Telehealth: Payer: Self-pay | Admitting: Oncology

## 2012-03-14 NOTE — Telephone Encounter (Signed)
S/W pt and advised that nxt appt is 9.26

## 2012-03-22 ENCOUNTER — Telehealth: Payer: Self-pay | Admitting: Family Medicine

## 2012-03-22 NOTE — Patient Instructions (Addendum)
1.  History of stage IV follicular lymphoma:  Only had 3 cycles of chemo Bendamustin/Rituxan (out of 6 planned cycles).  Therapy was stopped prematurely due to bone pain which turned out to be related to compression fracture that was not related to lymphoma. 2.  Current issue:  Lymph node swelling.  3.  Possibilities:  Transient reactive node vs. Recurrent follicular lymphoma vs. Transformed lymphoma into more aggressive sub type.  4.  Work up:  Depending on patient's level of comfort.  If more therapy is desired, then biopsy is recommended.  5.  If no therapy is desired, then keep an watchful observation and if lots of symptoms, may consider local radiation which is not curative but can relieve local symptoms.

## 2012-03-22 NOTE — Telephone Encounter (Signed)
Pt called req refill of morphine (MSIR) 15 MG tablet. Req to pick up script on Friday 03/24/12.

## 2012-03-23 ENCOUNTER — Other Ambulatory Visit (HOSPITAL_BASED_OUTPATIENT_CLINIC_OR_DEPARTMENT_OTHER): Payer: Medicare HMO | Admitting: Lab

## 2012-03-23 ENCOUNTER — Ambulatory Visit (HOSPITAL_BASED_OUTPATIENT_CLINIC_OR_DEPARTMENT_OTHER): Payer: Medicare HMO | Admitting: Oncology

## 2012-03-23 ENCOUNTER — Telehealth: Payer: Self-pay | Admitting: Oncology

## 2012-03-23 ENCOUNTER — Ambulatory Visit: Payer: Medicare HMO | Admitting: Oncology

## 2012-03-23 ENCOUNTER — Encounter: Payer: Self-pay | Admitting: Oncology

## 2012-03-23 VITALS — BP 135/66 | HR 66 | Temp 97.5°F | Resp 20 | Ht 62.5 in | Wt 96.1 lb

## 2012-03-23 DIAGNOSIS — M549 Dorsalgia, unspecified: Secondary | ICD-10-CM

## 2012-03-23 DIAGNOSIS — C8589 Other specified types of non-Hodgkin lymphoma, extranodal and solid organ sites: Secondary | ICD-10-CM

## 2012-03-23 DIAGNOSIS — C8590 Non-Hodgkin lymphoma, unspecified, unspecified site: Secondary | ICD-10-CM

## 2012-03-23 DIAGNOSIS — C8298 Follicular lymphoma, unspecified, lymph nodes of multiple sites: Secondary | ICD-10-CM

## 2012-03-23 LAB — CBC WITH DIFFERENTIAL/PLATELET
BASO%: 1 % (ref 0.0–2.0)
Eosinophils Absolute: 0.2 10*3/uL (ref 0.0–0.5)
LYMPH%: 16 % (ref 14.0–49.7)
MCHC: 34.3 g/dL (ref 31.5–36.0)
MONO#: 0.6 10*3/uL (ref 0.1–0.9)
NEUT#: 3.1 10*3/uL (ref 1.5–6.5)
Platelets: 193 10*3/uL (ref 145–400)
RBC: 3.45 10*6/uL — ABNORMAL LOW (ref 3.70–5.45)
RDW: 13.8 % (ref 11.2–14.5)
WBC: 4.7 10*3/uL (ref 3.9–10.3)
lymph#: 0.7 10*3/uL — ABNORMAL LOW (ref 0.9–3.3)

## 2012-03-23 LAB — COMPREHENSIVE METABOLIC PANEL (CC13)
ALT: 29 U/L (ref 0–55)
Albumin: 4.1 g/dL (ref 3.5–5.0)
CO2: 29 mEq/L (ref 22–29)
Chloride: 102 mEq/L (ref 98–107)
Glucose: 140 mg/dl — ABNORMAL HIGH (ref 70–99)
Potassium: 4.9 mEq/L (ref 3.5–5.1)
Sodium: 139 mEq/L (ref 136–145)
Total Bilirubin: 0.4 mg/dL (ref 0.20–1.20)
Total Protein: 6.4 g/dL (ref 6.4–8.3)

## 2012-03-23 LAB — LACTATE DEHYDROGENASE (CC13): LDH: 226 U/L — ABNORMAL HIGH (ref 125–220)

## 2012-03-23 MED ORDER — DIPHENHYD-HYDROCORT-NYSTATIN MT SUSP: 15.0000 mL | Freq: Four times a day (QID) | OROMUCOSAL | Status: DC | PRN

## 2012-03-23 MED ORDER — GABAPENTIN 100 MG PO CAPS: 100.0000 mg | ORAL_CAPSULE | Freq: Three times a day (TID) | ORAL | Status: DC

## 2012-03-23 NOTE — Telephone Encounter (Signed)
gve the pt her pet sccan appt along with the appt to see dr Gaylyn Rong

## 2012-03-23 NOTE — Telephone Encounter (Signed)
Morphine last filled 8/27.

## 2012-03-23 NOTE — Progress Notes (Signed)
[facility] OFFICE PROGRESS NOTE  Cc:  [name], MD  DIAGNOSIS:  Stage IV, low grade follicular lymphoma; FLIPI score of 3 or high risk (due to age >55; stage IV; and more than 4 areas of involved nodal areas).  Presenting beta-2 microglobulin of 2.42; LDH of 186.    PAST THERAPY: started q4 wk d1 Rituxan/Bendamustine; d2 Bendamustine; d3 Neulasta in Jan 2013; s/p 3 cycles; chemo stopped in March 2013 due to severe bone pain.    CURRENT THERAPY:  Watchful observation.   INTERVAL HISTORY: Colleen Cooke 77 y.o. female returns to clinic by herself. She was referred back to [place] by her PCP for palpable lymph nodes. The patient states they appeared very quickly. She can feel one lymph node in her right neck below her ear. She can also feel lymph nodes in her right supraclavicular area. She has not noted any lymphadenopathy anywhere else. States energy is slowly improving; thinks she is about 50% of her baseline energy level. She still has mid back pain; bilateral, radiating from the spine to bilateral ribs; intermittent, worse with activity.  Working out at [employer] and walking regularly. Using Morphine tablets about once a day. Appetite is good and she has gained about 4 lbs since her last visit with [place] in April 2013. Denies night sweats. No chest pain, shortness of breath, abdominal pain, nausea, vomiting.   Patient denies headache, visual changes, confusion, drenching night sweats, palpable lymph node swelling, mucositis, odynophagia, dysphagia, nausea vomiting, jaundice, palpitation, productive cough, gum bleeding, epistaxis, hematemesis, hemoptysis, abdominal pain, abdominal swelling, early satiety, melena, hematochezia, hematuria, skin rash, spontaneous bleeding, joint swelling, joint pain, heat or cold intolerance, bowel bladder incontinence, back pain, focal motor weakness, paresthesia, depression, suicidal or homocidal ideation, feeling hopelessness.     MEDICAL  HISTORY: Past Medical History  Diagnosis Date  . Chicken pox   . Hypertension   . 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 Back pain 10/13/2011    SURGICAL HISTORY:  Past Surgical History  Procedure Date  . Appendectomy     age 81  . Tonsillectomy     age 74    MEDICATIONS: Current Outpatient Prescriptions  Medication Sig Dispense Refill  . alendronate (FOSAMAX) 70 MG tablet Take 1 tablet (70 mg total) by mouth every 7 (seven) days. Take with a full glass of water on an empty stomach.  4 tablet  11  . aspirin 81 MG tablet Take 81 mg by mouth daily.       . calcium carbonate (OS-CAL) 600 MG TABS Take 600 mg by mouth daily.      . Diphenhyd-Hydrocort-Nystatin SUSP Swish and spit 15 mLs 4 (four) times daily as needed (Swish/gargle and spit as needed for mouth soreness).  500 mL  2  . gabapentin (NEURONTIN) 100 MG capsule Take 1 capsule (100 mg total) by mouth 3 (three) times daily.  90 capsule  3  . mirtazapine (REMERON SOL-TAB) 15 MG disintegrating tablet Take 15 mg by mouth at bedtime.       Marland Kitchen morphine (MSIR) 15 MG tablet Take 1 tablet (15 mg total) by mouth every 6 (six) hours as needed.  30 tablet  0  . DISCONTD: gabapentin (NEURONTIN) 100 MG capsule TAKE ONE CAPSULE BY MOUTH EVERY DAY FOR 3 DAYS THEN ONE TWICE DAILY FOR 3 DAYS THEN ONE THREE TIMES DAILY  90 capsule  3    ALLERGIES:  is allergic to penicillins.  REVIEW OF SYSTEMS:  The rest of the  14-point review of system was negative.   Filed Vitals:   03/23/12 1412  BP: 135/66  Pulse: 66  Temp: 97.5 F (36.4 C)  Resp: 20   Wt Readings from Last 3 Encounters:  03/23/12 96 lb 1.6 oz (43.591 kg)  03/09/12 95 lb (43.092 kg)  12/08/11 95 lb (43.092 kg)   ECOG Performance status: 1  PHYSICAL EXAMINATION:   General:  Thin-appearing woman; chronically ill appearing. There was no increased work of breathing.  Eyes:  no scleral icterus.  ENT:  There were no oropharyngeal lesions.  Neck was without thyromegaly.  Lymphatics: 2X2  palpable right cervical lymph node under right ear. She has palpable lymph nodes in her right level 2 and level 5 lymph nodes. Palpable lymph nodes in the bilateral groin.   Respiratory: clear breath sound bilaterally without decreased in tympany.  There was no rales or crackles.   Cardiovascular:  Tachy but regular rate and rhythm, S1/S2, without murmur, rub or gallop.  There was no pedal edema.  GI:  abdomen was soft, flat, nontender, nondistended, without organomegaly.  Muscoloskeletal:  There was no pain now on palpation of her back since she just took pain med before coming to clinic today.  Skin exam was without echymosis, petichae.  Neuro exam was nonfocal.  Patient was able to get on and off exam table without assistance.  Gait was normal.  Patient was alerted and oriented.  Attention was good.   Language was appropriate.  Mood was normal without depression.  Speech was not pressured.  Thought content was not tangential.      LABORATORY/RADIOLOGY DATA:  Lab Results  Component Value Date   WBC 4.7 03/23/2012   HGB 11.9 03/23/2012   HCT 34.7* 03/23/2012   PLT 193 03/23/2012   GLUCOSE 140* 03/23/2012   ALT 29 03/23/2012   AST 33 03/23/2012   NA 139 03/23/2012   K 4.9 03/23/2012   CL 102 03/23/2012   CREATININE 0.8 03/23/2012   BUN 14.0 03/23/2012   CO2 29 03/23/2012   TSH 1.64 06/09/2011    ASSESSMENT AND PLAN:   1. Right worse than left upper back pain: Due to compression fractures. She is on MSIR per Dr Caryl Never. Remains on Fosamax, calcium, and vitamin D.  2. Stage IV, low grade, but high risk follicular lymphoma:  S/p 3 cycles of Bendamustine/Rituxan with complete response per PET scan.  She did not want to go on maintenance Rituxan due to her poor performance status. She now has palpable lymph nodes in her right neck and groin. Patient seen with Dr Gaylyn Rong who discussed that this could be a recurrent follicular lymphoma versus transformation into a diffuse high grade B cell lymphoma. The  treatment for these differ and we need to obtain an accurate diagnosis. Will obtain a PET scan within the next week. Plan is to biopsy the most active spot on her PET scan. Once we see the results, plan is to refer her to either ENT or general surgery as warranted. She understands and wants to proceed.  3.  HTN:  Now no longer hypertensive due to weight loss.    4.  Grade 1 anemia:  From chemo. Anemia has now resolved with D/C of chemotherapy.  5. Moderate calorie-protein malnutrion and weight loss:  Appetite is better and slowly gaining weight on Remeron.  6. Follow up:  In 3 weeks to review biopsy results..     The length of time of the face-to-face encounter was 30  minutes. More than 50% of time was spent counseling and coordination of care.

## 2012-03-23 NOTE — Telephone Encounter (Signed)
Refill once 

## 2012-03-24 ENCOUNTER — Ambulatory Visit: Payer: Medicare HMO | Admitting: Family Medicine

## 2012-03-24 ENCOUNTER — Ambulatory Visit (INDEPENDENT_AMBULATORY_CARE_PROVIDER_SITE_OTHER): Payer: Medicare HMO

## 2012-03-24 DIAGNOSIS — Z23 Encounter for immunization: Secondary | ICD-10-CM

## 2012-03-24 MED ORDER — MORPHINE SULFATE 15 MG PO TABS: 15.0000 mg | ORAL_TABLET | Freq: Four times a day (QID) | ORAL | Status: DC | PRN

## 2012-03-24 NOTE — Progress Notes (Signed)
Pt was seen by midlevel. Please see same day note.

## 2012-03-24 NOTE — Telephone Encounter (Signed)
Pt informed Rx ready to pick up

## 2012-03-28 ENCOUNTER — Encounter (HOSPITAL_COMMUNITY)
Admission: RE | Admit: 2012-03-28 | Discharge: 2012-03-28 | Disposition: A | Discharge status: Home / Self Care | Payer: Medicare HMO | Source: Ambulatory Visit | Attending: Oncology | Admitting: Oncology

## 2012-03-28 DIAGNOSIS — C8590 Non-Hodgkin lymphoma, unspecified, unspecified site: Secondary | ICD-10-CM

## 2012-03-28 DIAGNOSIS — C8589 Other specified types of non-Hodgkin lymphoma, extranodal and solid organ sites: Secondary | ICD-10-CM | POA: Insufficient documentation

## 2012-03-28 MED ORDER — FLUDEOXYGLUCOSE F - 18 (FDG) INJECTION
17.5000 | Freq: Once | INTRAVENOUS | Status: AC | PRN
  Administered 2012-03-28: 17.5 via INTRAVENOUS

## 2012-03-31 ENCOUNTER — Encounter (HOSPITAL_BASED_OUTPATIENT_CLINIC_OR_DEPARTMENT_OTHER): Payer: Self-pay | Admitting: *Deleted

## 2012-03-31 NOTE — Progress Notes (Signed)
Bring all medications

## 2012-03-31 NOTE — H&P (Signed)
PREOPERATIVE H&P  Chief Complaint: new right neck node  HPI: Colleen Cooke is a 77 y.o. female who presents for evaluation of right neck adenopathy. She's status post chemotherapy for treatment of follicular lymphoma. She's referred back to me because of new right neck lymphadenopathy noted on recent PET scan for lymph node biopsy.   Past Medical History  Diagnosis Date  . Chicken pox   . 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 Non-Hodgkin's lymphoma, Burkitt's     Jan. 2013  . Back pain     pt has compression fractures in back  . Wears glasses   . Wears dentures     upper and lower   Past Surgical History  Procedure Date  . Appendectomy     age 12  . Tonsillectomy     age 6   History   Social History  . Marital Status: Married    Spouse Name: N/A    Number of Children: 2  . Years of Education: N/A   Occupational History  . retired Sports administrator    Social History Main Topics  . Smoking status: Former Smoker -- 1.0 packs/day for 20 years    Types: Cigarettes    Quit date: 01/07/1994  . Smokeless tobacco: Never Used  . Alcohol Use: 0.6 oz/week    1 Glasses of wine per week     Drinks 1 glass of wine daily  . Drug Use: No  . Sexually Active: Yes    Birth Control/ Protection: None   Other Topics Concern  . None   Social History Narrative  . None   Family History  Problem Relation Age of Onset  . Coronary artery disease Mother 16   Allergies  Allergen Reactions  . Penicillins     REACTION: swelling localized   Prior to Admission medications   Medication Sig Start Date End Date Taking? Authorizing Provider  acetaminophen (TYLENOL) 325 MG tablet Take 650 mg by mouth every 6 (six) hours as needed.   Yes Historical Provider, MD  Cholecalciferol (VITAMIN D3) 3000 UNITS TABS Take 1,000 Units by mouth.   Yes Historical Provider, MD  OVER THE COUNTER MEDICATION    Yes Historical Provider, MD  alendronate (FOSAMAX) 70 MG tablet Take 1 tablet (70 mg total) by mouth every 7  (seven) days. Take with a full glass of water on an empty stomach. 11/05/11 11/04/12  [name], MD  aspirin 81 MG tablet Take 81 mg by mouth daily.     Historical Provider, MD  calcium carbonate (OS-CAL) 600 MG TABS Take 600 mg by mouth daily.    Historical Provider, MD  Diphenhyd-Hydrocort-Nystatin SUSP Swish and spit 15 mLs 4 (four) times daily as needed (Swish/gargle and spit as needed for mouth soreness). 03/23/12   Myrtis Ser, NP  gabapentin (NEURONTIN) 100 MG capsule Take 1 capsule (100 mg total) by mouth 3 (three) times daily. 03/23/12   Myrtis Ser, NP  mirtazapine (REMERON SOL-TAB) 15 MG disintegrating tablet Take 15 mg by mouth at bedtime.  11/28/11   Historical Provider, MD  morphine (MSIR) 15 MG tablet Take 1 tablet (15 mg total) by mouth every 6 (six) hours as needed. 03/24/12   [name], MD     Positive ROS: no new symptoms  All other systems have been reviewed and were otherwise negative with the exception of those mentioned in the HPI and as above.  Physical Exam: There were no vitals filed for this visit.  General: Alert,  no acute distress Oral: Normal oral mucosa and tonsils Nasal: Clear nasal passages Neck: 2 cm firm node just inferior to the right ear. Another 2 cm node posterior inferior in the neck along the accessory chain of nodes just inferior to the initial biopsy Ear: Ear canal is clear with normal appearing TMs Cardiovascular: Regular rate and rhythm, no murmur.  Respiratory: Clear to auscultation Neurologic: Alert and oriented x 3   Assessment/Plan: RIGHT NECK NODE ENLARGEMENT Plan for Procedure(s): NECK MASS BIOPSY   Dillard Cannon, MD 03/31/2012 4:26 PM

## 2012-04-04 ENCOUNTER — Encounter (HOSPITAL_BASED_OUTPATIENT_CLINIC_OR_DEPARTMENT_OTHER)
Admission: RE | Disposition: A | Discharge status: Home / Self Care | Payer: Self-pay | Source: Ambulatory Visit | Attending: Otolaryngology

## 2012-04-04 ENCOUNTER — Ambulatory Visit (HOSPITAL_BASED_OUTPATIENT_CLINIC_OR_DEPARTMENT_OTHER): Payer: Medicare HMO | Admitting: Anesthesiology

## 2012-04-04 ENCOUNTER — Encounter (HOSPITAL_BASED_OUTPATIENT_CLINIC_OR_DEPARTMENT_OTHER): Payer: Self-pay | Admitting: *Deleted

## 2012-04-04 ENCOUNTER — Telehealth: Payer: Self-pay | Admitting: Family Medicine

## 2012-04-04 ENCOUNTER — Encounter (HOSPITAL_BASED_OUTPATIENT_CLINIC_OR_DEPARTMENT_OTHER): Payer: Self-pay | Admitting: Anesthesiology

## 2012-04-04 ENCOUNTER — Ambulatory Visit (HOSPITAL_BASED_OUTPATIENT_CLINIC_OR_DEPARTMENT_OTHER)
Admission: RE | Admit: 2012-04-04 | Discharge: 2012-04-04 | Disposition: A | Discharge status: Home / Self Care | Payer: Medicare HMO | Source: Ambulatory Visit | Attending: Otolaryngology | Admitting: Otolaryngology

## 2012-04-04 DIAGNOSIS — C8581 Other specified types of non-Hodgkin lymphoma, lymph nodes of head, face, and neck: Secondary | ICD-10-CM | POA: Insufficient documentation

## 2012-04-04 HISTORY — DX: Presence of spectacles and contact lenses: Z97.3

## 2012-04-04 HISTORY — DX: Presence of dental prosthetic device (complete) (partial): Z97.2

## 2012-04-04 HISTORY — PX: MASS BIOPSY: SHX5445

## 2012-04-04 HISTORY — DX: Burkitt lymphoma, unspecified site: C83.70

## 2012-04-04 SURGERY — BIOPSY, MASS, NECK
Anesthesia: General | Site: Neck | Laterality: Right | Wound class: Clean

## 2012-04-04 MED ORDER — FENTANYL CITRATE 0.05 MG/ML IJ SOLN
INTRAMUSCULAR | Status: DC | PRN
  Administered 2012-04-04: 50 ug via INTRAVENOUS

## 2012-04-04 MED ORDER — SUCCINYLCHOLINE CHLORIDE 20 MG/ML IJ SOLN
INTRAMUSCULAR | Status: DC | PRN
  Administered 2012-04-04: 60 mg via INTRAVENOUS

## 2012-04-04 MED ORDER — CEFAZOLIN SODIUM-DEXTROSE 2-3 GM-% IV SOLR
INTRAVENOUS | Status: DC | PRN
  Administered 2012-04-04: 2 g via INTRAVENOUS

## 2012-04-04 MED ORDER — HYDROMORPHONE HCL PF 1 MG/ML IJ SOLN: 0.2500 mg | INTRAMUSCULAR | Status: DC | PRN

## 2012-04-04 MED ORDER — EPHEDRINE SULFATE 50 MG/ML IJ SOLN
INTRAMUSCULAR | Status: DC | PRN
  Administered 2012-04-04 (×3): 10 mg via INTRAVENOUS

## 2012-04-04 MED ORDER — LIDOCAINE HCL (CARDIAC) 20 MG/ML IV SOLN
INTRAVENOUS | Status: DC | PRN
  Administered 2012-04-04: 35 mg via INTRAVENOUS

## 2012-04-04 MED ORDER — OXYCODONE HCL 5 MG PO TABS: 5.0000 mg | ORAL_TABLET | Freq: Once | ORAL | Status: DC | PRN

## 2012-04-04 MED ORDER — DEXAMETHASONE SODIUM PHOSPHATE 4 MG/ML IJ SOLN
INTRAMUSCULAR | Status: DC | PRN
  Administered 2012-04-04: 4 mg via INTRAVENOUS

## 2012-04-04 MED ORDER — LIDOCAINE-EPINEPHRINE 1 %-1:100000 IJ SOLN
INTRAMUSCULAR | Status: DC | PRN
  Administered 2012-04-04: 2 mL

## 2012-04-04 MED ORDER — PROPOFOL 10 MG/ML IV BOLUS
INTRAVENOUS | Status: DC | PRN
  Administered 2012-04-04: 80 mg via INTRAVENOUS

## 2012-04-04 MED ORDER — OXYCODONE HCL 5 MG/5ML PO SOLN: 5.0000 mg | Freq: Once | ORAL | Status: DC | PRN

## 2012-04-04 MED ORDER — ONDANSETRON HCL 4 MG/2ML IJ SOLN
INTRAMUSCULAR | Status: DC | PRN
  Administered 2012-04-04: 4 mg via INTRAVENOUS

## 2012-04-04 MED ORDER — LACTATED RINGERS IV SOLN
INTRAVENOUS | Status: DC
  Administered 2012-04-04: 08:00:00 via INTRAVENOUS

## 2012-04-04 SURGICAL SUPPLY — 54 items
BENZOIN TINCTURE PRP APPL 2/3 (GAUZE/BANDAGES/DRESSINGS) IMPLANT
BLADE SURG 15 STRL LF DISP TIS (BLADE) ×1 IMPLANT
BLADE SURG 15 STRL SS (BLADE) ×1
CANISTER SUCTION 1200CC (MISCELLANEOUS) ×2 IMPLANT
CLEANER CAUTERY TIP 5X5 PAD (MISCELLANEOUS) ×1 IMPLANT
CLOTH BEACON ORANGE TIMEOUT ST (SAFETY) ×2 IMPLANT
CORDS BIPOLAR (ELECTRODE) ×2 IMPLANT
COVER MAYO STAND STRL (DRAPES) ×2 IMPLANT
COVER TABLE BACK 60X90 (DRAPES) ×2 IMPLANT
DECANTER SPIKE VIAL GLASS SM (MISCELLANEOUS) IMPLANT
DERMABOND ADVANCED (GAUZE/BANDAGES/DRESSINGS)
DERMABOND ADVANCED .7 DNX12 (GAUZE/BANDAGES/DRESSINGS) IMPLANT
DRAPE U-SHAPE 76X120 STRL (DRAPES) ×2 IMPLANT
ELECT COATED BLADE 2.86 ST (ELECTRODE) ×2 IMPLANT
ELECT REM PT RETURN 9FT ADLT (ELECTROSURGICAL) ×2
ELECTRODE REM PT RTRN 9FT ADLT (ELECTROSURGICAL) ×1 IMPLANT
GAUZE SPONGE 4X4 12PLY STRL LF (GAUZE/BANDAGES/DRESSINGS) IMPLANT
GAUZE SPONGE 4X4 16PLY XRAY LF (GAUZE/BANDAGES/DRESSINGS) IMPLANT
GLOVE SS BIOGEL STRL SZ 7.5 (GLOVE) ×1 IMPLANT
GLOVE SUPERSENSE BIOGEL SZ 7.5 (GLOVE) ×1
GOWN PREVENTION PLUS XLARGE (GOWN DISPOSABLE) ×2 IMPLANT
HEMOSTAT SURGICEL .5X2 ABSORB (HEMOSTASIS) IMPLANT
LOCATOR NERVE 3 VOLT (DISPOSABLE) IMPLANT
NEEDLE HYPO 25X1 1.5 SAFETY (NEEDLE) ×2 IMPLANT
NS IRRIG 1000ML POUR BTL (IV SOLUTION) ×2 IMPLANT
PACK BASIN DAY SURGERY FS (CUSTOM PROCEDURE TRAY) ×2 IMPLANT
PAD CLEANER CAUTERY TIP 5X5 (MISCELLANEOUS) ×1
PENCIL BUTTON HOLSTER BLD 10FT (ELECTRODE) ×2 IMPLANT
SLEEVE SCD COMPRESS KNEE MED (MISCELLANEOUS) ×2 IMPLANT
SPONGE INTESTINAL PEANUT (DISPOSABLE) IMPLANT
STRIP CLOSURE SKIN 1/2X4 (GAUZE/BANDAGES/DRESSINGS) IMPLANT
STRIP CLOSURE SKIN 1/4X4 (GAUZE/BANDAGES/DRESSINGS) IMPLANT
SUCTION FRAZIER TIP 10 FR DISP (SUCTIONS) ×2 IMPLANT
SUT CHROMIC 3 0 PS 2 (SUTURE) ×2 IMPLANT
SUT CHROMIC 3 0 SH 27 (SUTURE) ×2 IMPLANT
SUT CHROMIC 3 0 TIES (SUTURE) IMPLANT
SUT ETHILON 4 0 PS 2 18 (SUTURE) IMPLANT
SUT ETHILON 5 0 P 3 18 (SUTURE) ×1
SUT NYLON ETHILON 5-0 P-3 1X18 (SUTURE) ×1 IMPLANT
SUT SILK 2 0 TIES 17X18 (SUTURE)
SUT SILK 2-0 18XBRD TIE BLK (SUTURE) IMPLANT
SUT SILK 3 0 SH 30 (SUTURE) IMPLANT
SUT SILK 3 0 TIES 17X18 (SUTURE) ×1
SUT SILK 3-0 18XBRD TIE BLK (SUTURE) ×1 IMPLANT
SUT VIC AB 5-0 P-3 18X BRD (SUTURE) IMPLANT
SUT VIC AB 5-0 P3 18 (SUTURE)
SWAB CULTURE LIQ STUART DBL (MISCELLANEOUS) IMPLANT
SYR BULB 3OZ (MISCELLANEOUS) ×2 IMPLANT
SYR CONTROL 10ML LL (SYRINGE) ×2 IMPLANT
TOWEL OR 17X24 6PK STRL BLUE (TOWEL DISPOSABLE) ×4 IMPLANT
TRAY DSU PREP LF (CUSTOM PROCEDURE TRAY) ×2 IMPLANT
TUBE ANAEROBIC SPECIMEN COL (MISCELLANEOUS) IMPLANT
TUBE CONNECTING 20X1/4 (TUBING) ×2 IMPLANT
WATER STERILE IRR 1000ML POUR (IV SOLUTION) IMPLANT

## 2012-04-04 NOTE — Anesthesia Postprocedure Evaluation (Signed)
  Anesthesia Post-op Note  Patient: Colleen Cooke  Procedure(s) Performed: Procedure(s) (LRB) with comments: NECK MASS BIOPSY (Right)  Patient Location: PACU  Anesthesia Type: General  Level of Consciousness: awake, alert  and oriented  Airway and Oxygen Therapy: Patient Spontanous Breathing  Post-op Pain: none  Post-op Assessment: Post-op Vital signs reviewed, Patient's Cardiovascular Status Stable, Respiratory Function Stable, Patent Airway and No signs of Nausea or vomiting  Post-op Vital Signs: Reviewed and stable  Complications: No apparent anesthesia complications

## 2012-04-04 NOTE — Transfer of Care (Signed)
Immediate Anesthesia Transfer of Care Note  Patient: Colleen Cooke  Procedure(s) Performed: Procedure(s) (LRB) with comments: NECK MASS BIOPSY (Right)  Patient Location: PACU  Anesthesia Type: General  Level of Consciousness: awake, alert  and oriented  Airway & Oxygen Therapy: Patient Spontanous Breathing and Patient connected to face mask oxygen  Post-op Assessment: Report given to PACU RN and Post -op Vital signs reviewed and stable  Post vital signs: Reviewed and stable  Complications: No apparent anesthesia complications

## 2012-04-04 NOTE — Brief Op Note (Signed)
04/04/2012  10:41 AM  PATIENT:  Colleen Cooke  77 y.o. female  PRE-OPERATIVE DIAGNOSIS:  RIGHT NECK NODE ENLARGEMENT  POST-OPERATIVE DIAGNOSIS:  RIGHT NECK NODE ENLARGEMENT  PROCEDURE:  Procedure(s) (LRB) with comments: NECK MASS BIOPSY (Right)  SURGEON:  Surgeon(s) and Role:    * Drema Halon, MD - Primary  PHYSICIAN ASSISTANT:   ASSISTANTS: none   ANESTHESIA:   general  EBL:  Total I/O In: 700 [I.V.:700] Out: -   BLOOD ADMINISTERED:none  DRAINS: none   LOCAL MEDICATIONS USED:  NONE  SPECIMEN:  Source of Specimen:  right low neck node  DISPOSITION OF SPECIMEN:  PATHOLOGY  COUNTS:  YES  TOURNIQUET:  * No tourniquets in log *  DICTATION: .Other Dictation: Dictation Number [id number]  PLAN OF CARE: Discharge to home after PACU  PATIENT DISPOSITION:  PACU - hemodynamically stable.   Delay start of Pharmacological VTE agent (>24hrs) due to surgical blood loss or risk of bleeding: yes

## 2012-04-04 NOTE — Interval H&P Note (Signed)
History and Physical Interval Note:  04/04/2012 9:05 AM  Colleen Cooke  has presented today for surgery, with the diagnosis of RIGHT NECK NODE ENLARGEMENT  The various methods of treatment have been discussed with the patient and family. After consideration of risks, benefits and other options for treatment, the patient has consented to  Procedure(s) (LRB) with comments: NECK MASS BIOPSY (Right) as a surgical intervention .  The patient's history has been reviewed, patient examined, no change in status, stable for surgery.  I have reviewed the patient's chart and labs.  Questions were answered to the patient's satisfaction.     [name]

## 2012-04-04 NOTE — Telephone Encounter (Signed)
Pt requesting refill on morphine (MSIR) 15 MG tablet pt is going out of town and is requesting a refill

## 2012-04-04 NOTE — Op Note (Deleted)
NAME:  Colleen Cooke, Colleen Cooke              ACCOUNT NO.:  [id number]  MEDICAL RECORD NO.:  [id number]  LOCATION:  XRAY                         FACILITY:  [facility]  PHYSICIAN:  Ioana Louks E. Fontaine Kossman, M.D.DATE OF BIRTH:  07/06/1934  DATE OF PROCEDURE:  04/04/2012 DATE OF DISCHARGE:  04/04/2012                              OPERATIVE REPORT   PREOPERATIVE DIAGNOSIS:  Right neck lymphadenopathy with a large right lower neck node.  POSTOPERATIVE DIAGNOSES:  Right neck lymphadenopathy with a large right lower neck node, history of lymphoma.  OPERATION PERFORMED:  Excisional biopsy of a low right accessory chain lymph node.  SURGEON:  Chaniece Barbato E. Dominik Lauricella, MD  ANESTHESIA:  General endotracheal.  COMPLICATIONS:  None.  BRIEF CLINICAL NOTE:  Colleen Cooke is a 77-year-old female, who was initially diagnosed with follicular lymphoma 10 months ago.  She underwent treatment with chemotherapy and initially responded but on the followup PET scan, she had enlarged hot nodes, 1 low in the right neck along the accessory chain of lymph nodes just supraclavicular, the other large node was up high along the superior jugular chain of lymph nodes just below the angle of the jaw on the right side.  She was taken to the operating room this time for excisional biopsy of the lower right neck node.  DESCRIPTION OF PROCEDURE:  After adequate endotracheal anesthesia, right neck was prepped with Betadine solution and draped out in sterile towels.  The node which measured approximately 2-2.5 cm in size was marked.  A horizontal incision was made directly over the node. Dissection was carried down through the subcutaneous tissue and platysma muscle until the node was identified.  The node was adherent to surrounding fat tissues.  This was dissected off with scissors and cautery.  Bipolar cautery was used for hemostasis on the smaller vessels.  After excising the node, the node was sent in saline flush to Pathology  for lymphoma workup.  The wound was irrigated with saline and then closed with 3-0 chromic sutures subcutaneously and 5-0 nylon on the skin.  Dressing was applied.  Colleen Cooke was awoke from anesthesia and transferred to recovery room, postop doing well.  DISPOSITION:  Colleen Cooke was discharged home later this morning on Tylenol and morphine tablets for pain.  She will follow up in my office in 6-7 days for recheck and have sutures removed.  She is scheduled to follow up with Dr. Ha in a couple weeks.          ______________________________ Colleen Cooke, M.D.     CEN/MEDQ  D:  04/04/2012  T:  04/04/2012  Job:  [id number]  cc:   Colleen Cooke, M.D. Colleen Cooke, M.D. 

## 2012-04-04 NOTE — Op Note (Signed)
[name], NIDA NO.:  [id number]  MEDICAL RECORD NO.:  [id number]  LOCATION:  XRAY                         FACILITY:  [facility]  PHYSICIAN:  Kristine Garbe. Ezzard Standing, M.D.DATE OF BIRTH:  1934-11-26  DATE OF PROCEDURE:  04/04/2012 DATE OF DISCHARGE:  04/04/2012                              OPERATIVE REPORT   PREOPERATIVE DIAGNOSIS:  Right neck lymphadenopathy with a large right lower neck node.  POSTOPERATIVE DIAGNOSES:  Right neck lymphadenopathy with a large right lower neck node, history of lymphoma.  OPERATION PERFORMED:  Excisional biopsy of a low right accessory chain lymph node.  SURGEON:  Kristine Garbe. Ezzard Standing, MD  ANESTHESIA:  General endotracheal.  COMPLICATIONS:  None.  BRIEF CLINICAL NOTE:  [name] is a 77 year old female, who was initially diagnosed with follicular lymphoma 10 months ago.  She underwent treatment with chemotherapy and initially responded but on the followup PET scan, she had enlarged hot nodes, 1 low in the right neck along the accessory chain of lymph nodes just supraclavicular, the other large node was up high along the superior jugular chain of lymph nodes just below the angle of the jaw on the right side.  She was taken to the operating room this time for excisional biopsy of the lower right neck node.  DESCRIPTION OF PROCEDURE:  After adequate endotracheal anesthesia, right neck was prepped with Betadine solution and draped out in sterile towels.  The node which measured approximately 2-2.5 cm in size was marked.  A horizontal incision was made directly over the node. Dissection was carried down through the subcutaneous tissue and platysma muscle until the node was identified.  The node was adherent to surrounding fat tissues.  This was dissected off with scissors and cautery.  Bipolar cautery was used for hemostasis on the smaller vessels.  After excising the node, the node was sent in saline flush to Pathology  for lymphoma workup.  The wound was irrigated with saline and then closed with 3-0 chromic sutures subcutaneously and 5-0 nylon on the skin.  Dressing was applied.  [name] was awoke from anesthesia and transferred to recovery room, postop doing well.  DISPOSITION:  Colleen Cooke was discharged home later this morning on Tylenol and morphine tablets for pain.  She will follow up in my office in 6-7 days for recheck and have sutures removed.  She is scheduled to follow up with Dr. Gaylyn Rong in a couple weeks.          ______________________________ Kristine Garbe Ezzard Standing, M.D.     CEN/MEDQ  D:  04/04/2012  T:  04/04/2012  Job:  [id number]  cc:   Evelena Peat, M.D. Exie Parody, M.D.

## 2012-04-04 NOTE — Anesthesia Preprocedure Evaluation (Addendum)
Anesthesia Evaluation  Patient identified by MRN, date of birth, ID band Patient awake    Reviewed: Allergy & Precautions, H&P , NPO status , Patient's Chart, lab work & pertinent test results  Airway Mallampati: I TM Distance: >3 FB Neck ROM: Full    Dental No notable dental hx. (+) Upper Dentures, Lower Dentures and Dental Advisory Given   Pulmonary neg pulmonary ROS,  breath sounds clear to auscultation  Pulmonary exam normal       Cardiovascular Rate:Normal     Neuro/Psych negative neurological ROS  negative psych ROS   GI/Hepatic negative GI ROS, Neg liver ROS,   Endo/Other  negative endocrine ROS  Renal/GU negative Renal ROS  negative genitourinary   Musculoskeletal   Abdominal   Peds  Hematology negative hematology ROS (+)   Anesthesia Other Findings   Reproductive/Obstetrics negative OB ROS                          Anesthesia Physical Anesthesia Plan  ASA: II  Anesthesia Plan: General   Post-op Pain Management:    Induction: Intravenous  Airway Management Planned: Oral ETT  Additional Equipment:   Intra-op Plan:   Post-operative Plan: Extubation in OR  Informed Consent: I have reviewed the patients History and Physical, chart, labs and discussed the procedure including the risks, benefits and alternatives for the proposed anesthesia with the patient or authorized representative who has indicated his/her understanding and acceptance.   Dental advisory given  Plan Discussed with: CRNA  Anesthesia Plan Comments:        Anesthesia Quick Evaluation

## 2012-04-05 ENCOUNTER — Encounter (HOSPITAL_BASED_OUTPATIENT_CLINIC_OR_DEPARTMENT_OTHER): Payer: Self-pay | Admitting: Otolaryngology

## 2012-04-05 LAB — POCT HEMOGLOBIN-HEMACUE: Hemoglobin: 12.6 g/dL (ref 12.0–15.0)

## 2012-04-05 NOTE — Telephone Encounter (Signed)
Morphine 15 mg, one tab every 6 hours as needed, last filled #30 with 0 refills on 03/24/12.  She is running low and heading to [facility] for 4 days

## 2012-04-06 NOTE — Telephone Encounter (Signed)
I have discussed with her reducing medication at this time.  This is too soon for refill.  If she has scaled back to once daily should have several left. I would like for her to taper OFF if possible.  Suggest follow up in couple of weeks and we can discuss tapering back milligrams.

## 2012-04-06 NOTE — Telephone Encounter (Signed)
Pt informed, she has 9 pills left, is now taking 1 daily.  I encouraged her to schedule ROV when she is due next to discuss tapering dose.  Pt voiced her understanding

## 2012-04-13 ENCOUNTER — Telehealth: Payer: Self-pay | Admitting: *Deleted

## 2012-04-13 ENCOUNTER — Other Ambulatory Visit: Payer: Self-pay | Admitting: Radiology

## 2012-04-13 ENCOUNTER — Telehealth: Payer: Self-pay | Admitting: Oncology

## 2012-04-13 ENCOUNTER — Other Ambulatory Visit (HOSPITAL_BASED_OUTPATIENT_CLINIC_OR_DEPARTMENT_OTHER): Payer: Medicare HMO | Admitting: Lab

## 2012-04-13 ENCOUNTER — Ambulatory Visit (HOSPITAL_BASED_OUTPATIENT_CLINIC_OR_DEPARTMENT_OTHER): Payer: Medicare HMO | Admitting: Oncology

## 2012-04-13 ENCOUNTER — Encounter: Payer: Self-pay | Admitting: Oncology

## 2012-04-13 DIAGNOSIS — C8590 Non-Hodgkin lymphoma, unspecified, unspecified site: Secondary | ICD-10-CM

## 2012-04-13 DIAGNOSIS — C8589 Other specified types of non-Hodgkin lymphoma, extranodal and solid organ sites: Secondary | ICD-10-CM

## 2012-04-13 DIAGNOSIS — E441 Mild protein-calorie malnutrition: Secondary | ICD-10-CM

## 2012-04-13 DIAGNOSIS — C8330 Diffuse large B-cell lymphoma, unspecified site: Secondary | ICD-10-CM

## 2012-04-13 DIAGNOSIS — D649 Anemia, unspecified: Secondary | ICD-10-CM

## 2012-04-13 DIAGNOSIS — M545 Low back pain: Secondary | ICD-10-CM

## 2012-04-13 DIAGNOSIS — S32000A Wedge compression fracture of unspecified lumbar vertebra, initial encounter for closed fracture: Secondary | ICD-10-CM

## 2012-04-13 HISTORY — DX: Diffuse large B-cell lymphoma, unspecified site: C83.30

## 2012-04-13 LAB — CBC WITH DIFFERENTIAL/PLATELET
BASO%: 0.7 % (ref 0.0–2.0)
Basophils Absolute: 0 10*3/uL (ref 0.0–0.1)
Eosinophils Absolute: 0.2 10*3/uL (ref 0.0–0.5)
HCT: 31.3 % — ABNORMAL LOW (ref 34.8–46.6)
HGB: 10.5 g/dL — ABNORMAL LOW (ref 11.6–15.9)
LYMPH%: 15.4 % (ref 14.0–49.7)
MCHC: 33.5 g/dL (ref 31.5–36.0)
MONO#: 0.7 10*3/uL (ref 0.1–0.9)
NEUT%: 68.6 % (ref 38.4–76.8)
Platelets: 178 10*3/uL (ref 145–400)
WBC: 5.5 10*3/uL (ref 3.9–10.3)
lymph#: 0.8 10*3/uL — ABNORMAL LOW (ref 0.9–3.3)

## 2012-04-13 LAB — COMPREHENSIVE METABOLIC PANEL (CC13)
AST: 32 U/L (ref 5–34)
Albumin: 4.1 g/dL (ref 3.5–5.0)
BUN: 15 mg/dL (ref 7.0–26.0)
CO2: 26 mEq/L (ref 22–29)
Calcium: 9.9 mg/dL (ref 8.4–10.4)
Chloride: 101 mEq/L (ref 98–107)
Creatinine: 0.8 mg/dL (ref 0.6–1.1)
Glucose: 115 mg/dl — ABNORMAL HIGH (ref 70–99)
Potassium: 4.7 mEq/L (ref 3.5–5.1)

## 2012-04-13 LAB — LACTATE DEHYDROGENASE (CC13): LDH: 279 U/L — ABNORMAL HIGH (ref 125–220)

## 2012-04-13 MED ORDER — ONDANSETRON HCL 8 MG PO TABS: 8.0000 mg | ORAL_TABLET | Freq: Two times a day (BID) | ORAL | Status: DC

## 2012-04-13 MED ORDER — PREDNISONE 20 MG PO TABS: 40.0000 mg | ORAL_TABLET | Freq: Every day | ORAL | Status: DC

## 2012-04-13 MED ORDER — LIDOCAINE-PRILOCAINE 2.5-2.5 % EX CREA: TOPICAL_CREAM | CUTANEOUS | Status: AC | PRN

## 2012-04-13 MED ORDER — PROCHLORPERAZINE MALEATE 10 MG PO TABS: 10.0000 mg | ORAL_TABLET | Freq: Four times a day (QID) | ORAL | Status: DC | PRN

## 2012-04-13 NOTE — Telephone Encounter (Signed)
Per staff phone call and POF I have scheduled appts. JMW  

## 2012-04-13 NOTE — Telephone Encounter (Signed)
called pt with echo appt as linda was able to get precert #[id number]    aom

## 2012-04-13 NOTE — Telephone Encounter (Signed)
appts made and printed for pt,pt aware that ir will call with her port placement and i will give linda info to get the echo precert

## 2012-04-13 NOTE — Patient Instructions (Addendum)
1.  Diagnosis:  Transformation of follicular lymphoma now to diffuse large B-cell lymphoma (aggressive lymphoma). 2.  Without treatment:  This lymphoma will spread to other places including bone marrow.  3.  Treatment:  - Treatment:  Lymphoma is often not cured by surgery since it is often a systemic disease.  - Chemotherapy: mini R-CHOP chemo is given once every 3 weeks; with day #2 injection of Neulasta to decrease risk of infection.  This regimen contains:  Rituxan, Cytoxan, Adriamycin, Vincristine, Prednisone.  The first 4 agents are given intravenously on day 1 here at the [facility].  Prednisone pill is taken at home, in the morning, on days 1-5 (ONLY) of each chemo cycle.   I recommended mini R-CHOP as opposed to full dose due to poor toleration of chemo in the past. Dose of mini R-CHOP is about 50% reduced compared to baseline.  If you tolerate reduced dose chemo well, we may consider increasing the dose with subsequent cycles.  - Number of cycle:  About 4-6 followed by neck radiation.    - Potential side effects of R-CHOP which include but not limited to infusion reaction, rare seizure disorder, reactivated hepatitis, fatigue, low blood count, hair loss, mouth sore, infection, bleeding, irritated bladder lining with blood in urine, constipation, numbness and tingling of fingers and toes, secondary cancer, congestive heart failure.   - To assess response to chemo, a repeat scan will be obtained after a few cycles of chemo.   - In preparation for therapy:    * Bone marrow biopsy to rule out lymphoma in bone marrow space.   * Referral to 2-D echocardiogram to assess baseline heart function.   * Referral to Interventional Radiology for Portacath placement.   * Pick up nausea medications from your preferred pharmacy.   * Return to clinic in about 10 days to go over last minute questions and to start chemo.

## 2012-04-14 ENCOUNTER — Ambulatory Visit (HOSPITAL_BASED_OUTPATIENT_CLINIC_OR_DEPARTMENT_OTHER): Payer: Medicare HMO | Admitting: Oncology

## 2012-04-14 ENCOUNTER — Telehealth: Payer: Self-pay | Admitting: Medical Oncology

## 2012-04-14 ENCOUNTER — Other Ambulatory Visit (HOSPITAL_COMMUNITY)
Admission: RE | Admit: 2012-04-14 | Discharge: 2012-04-14 | Disposition: A | Discharge status: Home / Self Care | Payer: Medicare HMO | Source: Ambulatory Visit | Attending: Oncology | Admitting: Oncology

## 2012-04-14 VITALS — BP 149/69 | HR 91 | Temp 96.9°F | Resp 18

## 2012-04-14 DIAGNOSIS — C8330 Diffuse large B-cell lymphoma, unspecified site: Secondary | ICD-10-CM

## 2012-04-14 DIAGNOSIS — C8589 Other specified types of non-Hodgkin lymphoma, extranodal and solid organ sites: Secondary | ICD-10-CM

## 2012-04-14 DIAGNOSIS — D649 Anemia, unspecified: Secondary | ICD-10-CM | POA: Insufficient documentation

## 2012-04-14 MED ORDER — MORPHINE SULFATE 15 MG PO TABS: 15.0000 mg | ORAL_TABLET | Freq: Four times a day (QID) | ORAL | Status: DC | PRN

## 2012-04-14 NOTE — Progress Notes (Signed)
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   PAST THERAPY:  For history of stage IV, low grade but high risk follicular lymphoma:  3 cycles of Bendamustine/Rituxan between Jan 2013 and March 2013.  She stopped chemo due to severe pain from compression fracture and severe bone pain.   CURRENT THERAPY:  Here to discuss treatment option.   INTERVAL HISTORY: Colleen Cooke 77 y.o. female returns for regular follow up to go over the result of biopsy.  She still has moderate lower back pain from compression fracture.  However, she is much more active now compared to earlier this year.  She is able to self-ambulate.  She denied leg weakness, paresthesia, bowel/bladder incontinence.  She has increased appetite now compared to the spring.  She has persistent enlargement of right neck node level II.  It is hurting her now.  She has recovered well from the excisional biopsy of the level III right neck node without pain, purulent discharge, erythema.  She denied palpable nodes elsewhere.    Patient denies fever, anorexia, weight loss, fatigue, headache, visual changes, confusion, drenching night sweats, mucositis, odynophagia, dysphagia, nausea vomiting, jaundice, chest pain, palpitation, shortness of breath, dyspnea on exertion, productive cough, gum bleeding, epistaxis, hematemesis, hemoptysis, abdominal pain, abdominal swelling, early satiety, melena, hematochezia, hematuria, skin rash, spontaneous bleeding, heat or cold intolerance, focal motor weakness, paresthesia, depression, suicidal or homicidal ideation, feeling hopelessness.   Past Medical History  Diagnosis Date  . Chicken pox   . 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 Non-Hodgkin's lymphoma, Burkitt's     Jan. 2013  . Back pain     pt has  compression fractures in back  . Wears glasses   . Wears dentures     upper and lower  . Diffuse large B cell lymphoma 04/13/2012    Past Surgical History  Procedure Date  . Appendectomy     age 18  . Tonsillectomy     age 87  . Mass biopsy 04/04/2012    Procedure: NECK MASS BIOPSY;  Surgeon: Drema Halon, MD;  Location: [facility];  Service: ENT;  Laterality: Right;    Current Outpatient Prescriptions  Medication Sig Dispense Refill  . acetaminophen (TYLENOL) 325 MG tablet Take 650 mg by mouth every 6 (six) hours as needed.      Marland Kitchen alendronate (FOSAMAX) 70 MG tablet Take 1 tablet (70 mg total) by mouth every 7 (seven) days. Take with a full glass of water on an empty stomach.  4 tablet  11  . Ascorbic Acid (VITAMIN C) 1000 MG tablet Take 500 mg by mouth daily.      Marland Kitchen aspirin 81 MG tablet Take 81 mg by mouth daily.       . calcium carbonate (OS-CAL) 600 MG TABS Take 600 mg by mouth daily.      . Cholecalciferol (VITAMIN D3) 3000 UNITS TABS Take 1,000 Units by mouth.      . Diphenhyd-Hydrocort-Nystatin SUSP Swish and spit 15 mLs 4 (four) times daily as needed (Swish/gargle and spit as needed for mouth soreness).  500 mL  2  . gabapentin (NEURONTIN) 100 MG capsule Take 1 capsule (100 mg total) by mouth 3 (three) times daily.  90 capsule  3  . mirtazapine (REMERON SOL-TAB) 15 MG disintegrating tablet Take 15  mg by mouth at bedtime.       Marland Kitchen morphine (MSIR) 15 MG tablet Take 1 tablet (15 mg total) by mouth every 6 (six) hours as needed for pain.  90 tablet  0  . OVER THE COUNTER MEDICATION       . lidocaine-prilocaine (EMLA) cream Apply topically as needed. Put it on portacath one hour before access.  30 g  2  . ondansetron (ZOFRAN) 8 MG tablet Take 1 tablet (8 mg total) by mouth 2 (two) times daily. Take two times a day starting the day after chemo for 3 days. Then take two times a day as needed for nausea or vomiting.  30 tablet  1  . predniSONE (DELTASONE) 20 MG  tablet Take 2 tablets (40 mg total) by mouth daily. Take on days 1-5 of chemotherapy.  10 tablet  5  . prochlorperazine (COMPAZINE) 10 MG tablet Take 1 tablet (10 mg total) by mouth every 6 (six) hours as needed (Nausea or vomiting).  30 tablet  6    ALLERGIES:  is allergic to penicillins.  REVIEW OF SYSTEMS:  The rest of the 14-point review of system was negative.   There were no vitals filed for this visit. Wt Readings from Last 3 Encounters:  04/04/12 90 lb 2 oz (40.88 kg)  04/04/12 90 lb 2 oz (40.88 kg)  03/23/12 96 lb 1.6 oz (43.591 kg)   ECOG Performance status: 1  PHYSICAL EXAMINATION:   General:  Thin-appearing woman, in no acute distress.  Eyes:  no scleral icterus.  ENT:  There were no oropharyngeal lesions.  Neck was without thyromegaly.  Lymphatics:  Positive for right level II neck node about 3 cm; hard, tender to palpation.  Negative for supraclavicular, inguinal, axillary adenopathy.  Respiratory: lungs were clear bilaterally without wheezing or crackles.  Cardiovascular:  Regular rate and rhythm, S1/S2, without murmur, rub or gallop.  There was no pedal edema.  GI:  abdomen was soft, flat, nontender, nondistended, without organomegaly.  Muscoloskeletal:  no spinal tenderness of palpation of vertebral spine.  Skin exam was without echymosis, petichae.  Neuro exam was nonfocal.  Patient was able to get on and off exam table without assistance.  Gait was normal.  Patient was alerted and oriented.  Attention was good.   Language was appropriate.  Mood was normal without depression.  Speech was not pressured.  Thought content was not tangential.     LABORATORY/RADIOLOGY DATA:  Lab Results  Component Value Date   WBC 5.5 04/13/2012   HGB 10.5* 04/13/2012   HCT 31.3* 04/13/2012   PLT 178 04/13/2012   GLUCOSE 115* 04/13/2012   ALKPHOS 114 04/13/2012   ALT 37 04/13/2012   AST 32 04/13/2012   NA 134* 04/13/2012   K 4.7 04/13/2012   CL 101 04/13/2012   CREATININE 0.8  04/13/2012   BUN 15.0 04/13/2012   CO2 26 04/13/2012   IMAGING:  I personally reviewed the following PET scan and showed the patient the images.  Nm Pet Image Restag (ps) Skull Base To Thigh  03/28/2012  *RADIOLOGY REPORT*  Clinical Data: Subsequent treatment strategy for lymphoma.  NUCLEAR MEDICINE PET SKULL BASE TO THIGH  Fasting Blood Glucose:  92  Technique:  17.5 mCi F-18 FDG was injected intravenously. CT data was obtained and used for attenuation correction and anatomic localization only.  (This was not acquired as a diagnostic CT examination.) Additional exam technical data entered on technologist worksheet.  Comparison:  PET CT 10/12/2011  Findings:  Neck: There is a new intense hypermetabolic lymph node in the right neck posterior to the angle of the jaw and  beneath the skin measuring 2.1 x 1.8 cm (image 25) with intense metabolic activity ( SUV max = 16.1) .  There is a smaller adjacent hypermetabolic lymph node measuring 10 mm (image 25) with equal  metabolic intensity.  There are lymph nodes in the paraspinal deep posterior neck which are hypermetabolic on the left and right (image 27).  There is an intensely  hypermetabolic right supraclavicular node in the lateral inferior right neck measuring 13 mm x 17 mm (image 51) with SUV max = 21.3.  There are several foci of metabolic activity along the transverse processes of the lower cervical spine without clear lymph nodes.  Chest:  No hypermetabolic mediastinal lymph nodes.  No suspicious pulmonary nodules.  Interval resolution of hypermetabolic activity in the left lower lobe described on comparison PET CT scan.  Abdomen/Pelvis:  None of hypermetabolic abdominal pelvic lymph nodes.  The spleen is normal. Small inguinal lymph nodes have no significant hypermetabolic activity.  Skeleton:  There are multiple levels of compression fractures throughout the thoracic and lumbar spine.  No hypermetabolic activity to suggest metastasis.  IMPRESSION:  1.  High-grade lymphoma recurrence with intensely hypermetabolic lymph nodes in the right neck.  These lymph nodes are superficial and assessable to biopsy. 2.  No evidence of thoracic metastasis or abdominal metastasis.   Original Report Authenticated By: Genevive Bi, M.D.      ASSESSMENT AND PLAN:    1.  Diagnosis:  Transformation of follicular lymphoma now to diffuse large B-cell lymphoma (aggressive lymphoma). -Without treatment:  This lymphoma will spread to other places including bone marrow.  - Treatment:  Diffused, transformed lymphoma is often not cured by surgery since it is often a systemic disease.  - Chemotherapy: mini R-CHOP chemo is given once every 3 weeks; with day #2 injection of Neulasta to decrease risk of infection.  This regimen contains:  Rituxan, Cytoxan, Adriamycin, Vincristine, Prednisone.  The first 4 agents are given intravenously on day 1 here at the [facility].  Prednisone pill is taken at home, in the morning, on days 1-5 (ONLY) of each chemo cycle.   I recommended mini R-CHOP as opposed to full dose due to poor toleration of chemo in the past. Dose of mini R-CHOP is about 50% reduced compared to baseline.  If you tolerate reduced dose chemo well, we may consider increasing the dose with subsequent cycles.  - Number of cycle:  About 6 cycle of chemo.  If she is not able to tolerate 6 cycles, we may consider stopping after 4 and consider consolidative radiation to the neck.   - Potential side effects of R-CHOP which include but not limited to infusion reaction, rare seizure disorder, reactivated hepatitis, fatigue, low blood count, hair loss, mouth sore, infection, bleeding, irritated bladder lining with blood in urine, constipation, numbness and tingling of fingers and toes, secondary cancer, congestive heart failure.   - To assess response to chemo, a repeat scan will be obtained after a few cycles of chemo.   - In preparation for therapy:    * Bone marrow biopsy  to rule out lymphoma in bone marrow space.   * Referral to 2-D echocardiogram to assess baseline heart function.   * Referral to Interventional Radiology for Portacath placement.   * Pick up nausea medications from your preferred pharmacy.   * She would like to  attend chemo class again.   * Return to clinic in about 10 days to go over last minute questions and to start chemo.      2. Right worse than left upper back pain: Due to compression fractures. She is on MSIR per Dr Caryl Never. She is also on Neurontin for neuropathic component of her pain.  Remains on Fosamax, calcium, and vitamin D.  She asked for more MSIR due to also pain in the right neck node.  I gave her 90 tab of MSIR 15mg  PO q6hr prn pain.  I advised her on bowel regimen.  3.  HTN: Now no longer hypertensive due to weight loss  4.  Grade 1 anemia:  Could be from lymphoma.  Pending result of BM biopsy.  If neg BM, I will consider anemia work up.  5.  Mildcalorie-protein malnutrion and weight loss: Appetite is better and slowly gaining weight on Remeron.  6.  Follow up: In 2 weeks to go over last minutes question before starting chemo.   7.  Code status:  I will need to address this in the future.  There was too much to cover today.     The length of time of the face-to-face encounter was 40  minutes. More than 50% of time was spent counseling and coordination of care.

## 2012-04-14 NOTE — Procedures (Signed)
   [facility]  Telephone:(336) [phone] Fax:([phone]   BONE MARROW BIOPSY AND ASPIRATION   INDICATION:  Staging for transformed follicular to diffuse large B-cell lymphoma; now with anemia.   Procedure: After obtained from consent, Colleen Cooke was placed in the prone position. Time out was performed verifying correct patient and procedure. The skin overlying the lfet posterior crest was prepped with Betadine and draped in the usual sterile fashion. The skin and periosteum were infiltrated with 10 mL of 2% lidocaine. A small puncture wound was made with #11 scalpel blade.  Bone marrow aspirate was obtained on the first pass of the aspiration needle.  One separate core biopsy was obtained through the same incision.   The aspirate was sent for routine histology, flow cytometry, and cytogenetics.  Core biopsy was sent for routine histology.   Colleen Cooke tolerated procedure well with minimal  blood loss and without immediate complication.   A sterile dressing was applied.   Jethro Bolus M.D. 04/14/2012

## 2012-04-14 NOTE — Progress Notes (Signed)
0800, patient brought to infusion exam room, consent signed.  Patient undressed, laying supine.  Flow cytometry and Dr. Gaylyn Rong present for BMBX.  Patient tolerated well with no complaints.  Currently laying supine, okay to be discharged at 0900.  Report given to Vincent Peyer, RN.

## 2012-04-14 NOTE — Progress Notes (Signed)
Please see bone marrow biopsy note dated 04/14/12.

## 2012-04-14 NOTE — Telephone Encounter (Signed)
s.w. pt and advised on chemo edu class 10.23.13 @ 10.00

## 2012-04-14 NOTE — Telephone Encounter (Signed)
Spoke to pt husband and told him to pick her up at 0915.

## 2012-04-14 NOTE — Patient Instructions (Addendum)
Bone Marrow Aspiration, Bone Marrow Biopsy Care After Read the instructions outlined below and refer to this sheet in the next few weeks. These discharge instructions provide you with general information on caring for yourself after you leave the hospital. Your caregiver may also give you specific instructions. While your treatment has been planned according to the most current medical practices available, unavoidable complications occasionally occur. If you have any problems or questions after discharge, call your caregiver. FINDING OUT THE RESULTS OF YOUR TEST Not all test results are available during your visit. If your test results are not back during the visit, make an appointment with your caregiver to find out the results. Do not assume everything is normal if you have not heard from your caregiver or the medical facility. It is important for you to follow up on all of your test results.  HOME CARE INSTRUCTIONS   YKeep your dressing clean and dry. You may replace dressing with a bandage after 24 hours.  You may take a bath or shower after 24 hours.  Use an ice pack for 20 minutes every 2 hours while awake for pain as needed. SEEK MEDICAL CARE IF:   There is redness, swelling, or increasing pain at the biopsy site.  There is pus coming from the biopsy site.  There is drainage from a biopsy site lasting longer than one day.  An unexplained oral temperature above 102 F (38.9 C) develops. SEEK IMMEDIATE MEDICAL CARE IF:   You develop a rash.  You have difficulty breathing.  You develop any reaction or side effects to medications given. Document Released: 01/01/2005 Document Revised: 09/06/2011 Document Reviewed: 06/11/2008 [facility] Patient Information 2013 [place], [place].

## 2012-04-17 ENCOUNTER — Encounter (HOSPITAL_COMMUNITY): Payer: Self-pay | Admitting: Pharmacy Technician

## 2012-04-18 ENCOUNTER — Other Ambulatory Visit: Payer: Self-pay | Admitting: Oncology

## 2012-04-18 ENCOUNTER — Ambulatory Visit (HOSPITAL_COMMUNITY)
Admission: RE | Admit: 2012-04-18 | Discharge: 2012-04-18 | Disposition: A | Discharge status: Home / Self Care | Payer: Medicare HMO | Source: Ambulatory Visit | Attending: Oncology | Admitting: Oncology

## 2012-04-18 VITALS — BP 147/57 | HR 77 | Temp 97.6°F | Resp 14 | Ht 60.0 in | Wt 90.0 lb

## 2012-04-18 DIAGNOSIS — S32000A Wedge compression fracture of unspecified lumbar vertebra, initial encounter for closed fracture: Secondary | ICD-10-CM

## 2012-04-18 DIAGNOSIS — Z79899 Other long term (current) drug therapy: Secondary | ICD-10-CM | POA: Insufficient documentation

## 2012-04-18 DIAGNOSIS — C8330 Diffuse large B-cell lymphoma, unspecified site: Secondary | ICD-10-CM

## 2012-04-18 DIAGNOSIS — C8589 Other specified types of non-Hodgkin lymphoma, extranodal and solid organ sites: Secondary | ICD-10-CM | POA: Insufficient documentation

## 2012-04-18 DIAGNOSIS — C8590 Non-Hodgkin lymphoma, unspecified, unspecified site: Secondary | ICD-10-CM

## 2012-04-18 LAB — CBC
MCV: 98.6 fL (ref 78.0–100.0)
Platelets: 203 10*3/uL (ref 150–400)
RBC: 3.5 MIL/uL — ABNORMAL LOW (ref 3.87–5.11)
RDW: 14 % (ref 11.5–15.5)
WBC: 4.7 10*3/uL (ref 4.0–10.5)

## 2012-04-18 MED ORDER — LIDOCAINE HCL 1 % IJ SOLN
INTRAMUSCULAR | Status: AC
  Filled 2012-04-18: qty 20

## 2012-04-18 MED ORDER — VANCOMYCIN HCL IN DEXTROSE 1-5 GM/200ML-% IV SOLN
1000.0000 mg | INTRAVENOUS | Status: AC
  Administered 2012-04-18: 1000 mg via INTRAVENOUS
  Filled 2012-04-18: qty 200

## 2012-04-18 MED ORDER — HEPARIN SOD (PORK) LOCK FLUSH 100 UNIT/ML IV SOLN
INTRAVENOUS | Status: AC | PRN
  Administered 2012-04-18: 500 [IU]

## 2012-04-18 MED ORDER — SODIUM CHLORIDE 0.9 % IV SOLN
INTRAVENOUS | Status: DC
  Administered 2012-04-18: 09:00:00 via INTRAVENOUS

## 2012-04-18 MED ORDER — FENTANYL CITRATE 0.05 MG/ML IJ SOLN
INTRAMUSCULAR | Status: AC | PRN
  Administered 2012-04-18: 50 ug via INTRAVENOUS

## 2012-04-18 MED ORDER — FENTANYL CITRATE 0.05 MG/ML IJ SOLN
INTRAMUSCULAR | Status: AC
  Filled 2012-04-18: qty 6

## 2012-04-18 MED ORDER — MIDAZOLAM HCL 2 MG/2ML IJ SOLN
INTRAMUSCULAR | Status: AC | PRN
  Administered 2012-04-18: 1 mg via INTRAVENOUS

## 2012-04-18 MED ORDER — MIDAZOLAM HCL 2 MG/2ML IJ SOLN
INTRAMUSCULAR | Status: AC
  Filled 2012-04-18: qty 6

## 2012-04-18 NOTE — Procedures (Signed)
Right IJ PowerPort  No complication No blood loss. See complete dictation in [facility].

## 2012-04-18 NOTE — H&P (Signed)
Colleen Cooke is an 77 y.o. female.   Chief Complaint: "I'm here for a port a cath" HPI: Patient with history of diffuse large B cell lymphoma presents today for port a cath placement for chemotherapy.  Past Medical History  Diagnosis Date  . Chicken pox   . 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 Non-Hodgkin's lymphoma, Burkitt's     Jan. 2013  . Back pain     pt has compression fractures in back  . Wears glasses   . Wears dentures     upper and lower  . Diffuse large B cell lymphoma 04/13/2012    Past Surgical History  Procedure Date  . Appendectomy     age 43  . Tonsillectomy     age 64  . Mass biopsy 04/04/2012    Procedure: NECK MASS BIOPSY;  Surgeon: Drema Halon, MD;  Location: [facility];  Service: ENT;  Laterality: Right;    Family History  Problem Relation Age of Onset  . Coronary artery disease Mother 68   Social History:  reports that she quit smoking about 18 years ago. Her smoking use included Cigarettes. She has a 20 pack-year smoking history. She has never used smokeless tobacco. She reports that she drinks about .6 ounces of alcohol per week. She reports that she does not use illicit drugs.  Allergies:  Allergies  Allergen Reactions  . Penicillins     REACTION: swelling localized    Current outpatient prescriptions:acetaminophen (TYLENOL) 500 MG tablet, Take 1,000 mg by mouth every morning., Disp: , Rfl: ;  alendronate (FOSAMAX) 70 MG tablet, Take 70 mg by mouth every Sunday. Take with a full glass of water on an empty stomach., Disp: , Rfl: ;  Calcium Carbonate (CALCIUM 600 PO), Take 630 mg by mouth daily., Disp: , Rfl: ;  cholecalciferol (VITAMIN D) 1000 UNITS tablet, Take 1,000 Units by mouth daily., Disp: , Rfl:  Diphenhyd-Hydrocort-Nystatin SUSP, Swish and spit 15 mLs 4 (four) times daily as needed (Swish/gargle and spit as needed for mouth soreness)., Disp: 500 mL, Rfl: 2;  gabapentin (NEURONTIN) 100 MG capsule, Take 1 capsule (100 mg  total) by mouth 3 (three) times daily., Disp: 90 capsule, Rfl: 3;  lidocaine-prilocaine (EMLA) cream, Apply topically as needed. Put it on portacath one hour before access., Disp: 30 g, Rfl: 2 mirtazapine (REMERON) 7.5 MG tablet, Take 7.5 mg by mouth at bedtime., Disp: , Rfl: ;  morphine (MSIR) 15 MG tablet, Take 1 tablet (15 mg total) by mouth every 6 (six) hours as needed for pain., Disp: 90 tablet, Rfl: 0 ondansetron (ZOFRAN) 8 MG tablet, Take 1 tablet (8 mg total) by mouth 2 (two) times daily. Take two times a day starting the day after chemo for 3 days. Then take two times a day as needed for nausea or vomiting., Disp: 30 tablet, Rfl: 1;  OVER THE COUNTER MEDICATION, , Disp: , Rfl: ;  OVER THE COUNTER MEDICATION, Take 1,000 mg by mouth daily. Emergency vitamin c powder, Disp: , Rfl:  polyethylene glycol (MIRALAX / GLYCOLAX) packet, Take 17 g by mouth every morning., Disp: , Rfl: ;  predniSONE (DELTASONE) 20 MG tablet, Take 2 tablets (40 mg total) by mouth daily. Take on days 1-5 of chemotherapy., Disp: 10 tablet, Rfl: 5;  prochlorperazine (COMPAZINE) 10 MG tablet, Take 1 tablet (10 mg total) by mouth every 6 (six) hours as needed (Nausea or vomiting)., Disp: 30 tablet, Rfl: 6 Current facility-administered medications:0.9 %  sodium chloride infusion, ,  Intravenous, Continuous, D Kevin Kazimir Hartnett, PA;  fentaNYL (SUBLIMAZE) 0.05 MG/ML injection, , , , ;  midazolam (VERSED) 2 MG/2ML injection, , , , ;  vancomycin (VANCOCIN) IVPB 1000 mg/200 mL premix, 1,000 mg, Intravenous, On Call, D Jeananne Rama, PA   Results for orders placed during the hospital encounter of 04/18/12 (from the past 48 hour(s))  APTT     Status: Abnormal   Collection Time   04/18/12  7:20 AM      Component Value Range Comment   aPTT 23 (*) 24 - 37 seconds   CBC     Status: Abnormal   Collection Time   04/18/12  7:20 AM      Component Value Range Comment   WBC 4.7  4.0 - 10.5 K/uL    RBC 3.50 (*) 3.87 - 5.11 MIL/uL    Hemoglobin  11.6 (*) 12.0 - 15.0 g/dL    HCT 16.1 (*) 09.6 - 46.0 %    MCV 98.6  78.0 - 100.0 fL    MCH 33.1  26.0 - 34.0 pg    MCHC 33.6  30.0 - 36.0 g/dL    RDW 04.5  40.9 - 81.1 %    Platelets 203  150 - 400 K/uL   PROTIME-INR     Status: Normal   Collection Time   04/18/12  7:20 AM      Component Value Range Comment   Prothrombin Time 11.9  11.6 - 15.2 seconds    INR 0.88  0.00 - 1.49    No results found.  Review of Systems  Constitutional: Negative for fever and chills.  Respiratory: Negative for cough and shortness of breath.   Cardiovascular: Negative for chest pain.  Gastrointestinal: Negative for nausea, vomiting and abdominal pain.  Musculoskeletal: Positive for back pain.  Neurological: Negative for headaches.    Blood pressure 145/59, pulse 77, temperature 97.6 F (36.4 C), resp. rate 16, height 5' (1.524 m), weight 90 lb (40.824 kg), SpO2 96.00%. Physical Exam  Constitutional: She is oriented to person, place, and time.       Thin WF in NAD  Respiratory: Effort normal and breath sounds normal.  GI: Soft. Bowel sounds are normal. There is no tenderness.  Musculoskeletal: Normal range of motion.  Neurological: She is alert and oriented to person, place, and time.     Assessment/Plan Pt with history of diffuse large B cell lymphoma. Plan is for placement of a port a cath today for chemotherapy. Details/risks of procedure d/w pt with her understanding and consent.  Lundon Rosier,D KEVIN 04/18/2012, 8:06 AM

## 2012-04-19 ENCOUNTER — Other Ambulatory Visit: Payer: Medicare HMO

## 2012-04-20 ENCOUNTER — Ambulatory Visit (HOSPITAL_COMMUNITY)
Admission: RE | Admit: 2012-04-20 | Discharge: 2012-04-20 | Disposition: A | Discharge status: Home / Self Care | Payer: Medicare HMO | Source: Ambulatory Visit | Attending: Oncology | Admitting: Oncology

## 2012-04-20 DIAGNOSIS — I359 Nonrheumatic aortic valve disorder, unspecified: Secondary | ICD-10-CM

## 2012-04-20 DIAGNOSIS — S32000A Wedge compression fracture of unspecified lumbar vertebra, initial encounter for closed fracture: Secondary | ICD-10-CM

## 2012-04-20 DIAGNOSIS — C8330 Diffuse large B-cell lymphoma, unspecified site: Secondary | ICD-10-CM

## 2012-04-20 DIAGNOSIS — Z01818 Encounter for other preprocedural examination: Secondary | ICD-10-CM | POA: Insufficient documentation

## 2012-04-20 DIAGNOSIS — C8589 Other specified types of non-Hodgkin lymphoma, extranodal and solid organ sites: Secondary | ICD-10-CM | POA: Insufficient documentation

## 2012-04-20 NOTE — Progress Notes (Signed)
  Echocardiogram 2D Echocardiogram Limited has been performed.  Colleen Cooke 04/20/2012, 10:47 AM

## 2012-04-24 ENCOUNTER — Other Ambulatory Visit (HOSPITAL_BASED_OUTPATIENT_CLINIC_OR_DEPARTMENT_OTHER): Payer: Medicare HMO | Admitting: Lab

## 2012-04-24 ENCOUNTER — Encounter: Payer: Self-pay | Admitting: Oncology

## 2012-04-24 ENCOUNTER — Ambulatory Visit (HOSPITAL_BASED_OUTPATIENT_CLINIC_OR_DEPARTMENT_OTHER): Payer: Medicare HMO

## 2012-04-24 ENCOUNTER — Telehealth: Payer: Self-pay | Admitting: Oncology

## 2012-04-24 ENCOUNTER — Ambulatory Visit (HOSPITAL_BASED_OUTPATIENT_CLINIC_OR_DEPARTMENT_OTHER): Payer: Medicare HMO | Admitting: Oncology

## 2012-04-24 VITALS — BP 144/70 | HR 85 | Temp 97.9°F | Resp 18 | Ht 65.0 in | Wt 95.9 lb

## 2012-04-24 VITALS — BP 125/44 | HR 87 | Temp 97.8°F | Resp 16

## 2012-04-24 DIAGNOSIS — C8589 Other specified types of non-Hodgkin lymphoma, extranodal and solid organ sites: Secondary | ICD-10-CM

## 2012-04-24 DIAGNOSIS — C8330 Diffuse large B-cell lymphoma, unspecified site: Secondary | ICD-10-CM

## 2012-04-24 DIAGNOSIS — D649 Anemia, unspecified: Secondary | ICD-10-CM

## 2012-04-24 DIAGNOSIS — Z5112 Encounter for antineoplastic immunotherapy: Secondary | ICD-10-CM

## 2012-04-24 DIAGNOSIS — M898X9 Other specified disorders of bone, unspecified site: Secondary | ICD-10-CM

## 2012-04-24 DIAGNOSIS — Z5111 Encounter for antineoplastic chemotherapy: Secondary | ICD-10-CM

## 2012-04-24 DIAGNOSIS — J90 Pleural effusion, not elsewhere classified: Secondary | ICD-10-CM

## 2012-04-24 DIAGNOSIS — M549 Dorsalgia, unspecified: Secondary | ICD-10-CM

## 2012-04-24 DIAGNOSIS — C8590 Non-Hodgkin lymphoma, unspecified, unspecified site: Secondary | ICD-10-CM

## 2012-04-24 LAB — CBC WITH DIFFERENTIAL/PLATELET
BASO%: 0.7 % (ref 0.0–2.0)
HCT: 34.1 % — ABNORMAL LOW (ref 34.8–46.6)
LYMPH%: 7.2 % — ABNORMAL LOW (ref 14.0–49.7)
MCH: 33.4 pg (ref 25.1–34.0)
MCHC: 34 g/dL (ref 31.5–36.0)
MCV: 98.3 fL (ref 79.5–101.0)
MONO#: 0.3 10*3/uL (ref 0.1–0.9)
MONO%: 4.9 % (ref 0.0–14.0)
NEUT%: 86.1 % — ABNORMAL HIGH (ref 38.4–76.8)
Platelets: 192 10*3/uL (ref 145–400)
RBC: 3.47 10*6/uL — ABNORMAL LOW (ref 3.70–5.45)
nRBC: 0 % (ref 0–0)

## 2012-04-24 LAB — COMPREHENSIVE METABOLIC PANEL (CC13)
ALT: 25 U/L (ref 0–55)
AST: 30 U/L (ref 5–34)
BUN: 12 mg/dL (ref 7.0–26.0)
Calcium: 10 mg/dL (ref 8.4–10.4)
Chloride: 105 mEq/L (ref 98–107)
Creatinine: 0.7 mg/dL (ref 0.6–1.1)
Total Bilirubin: 0.4 mg/dL (ref 0.20–1.20)

## 2012-04-24 LAB — LACTATE DEHYDROGENASE (CC13): LDH: 381 U/L — ABNORMAL HIGH (ref 125–220)

## 2012-04-24 MED ORDER — ACETAMINOPHEN 325 MG PO TABS
650.0000 mg | ORAL_TABLET | Freq: Once | ORAL | Status: AC
  Administered 2012-04-24: 650 mg via ORAL

## 2012-04-24 MED ORDER — VINCRISTINE SULFATE CHEMO INJECTION 1 MG/ML
1.0000 mg | Freq: Once | INTRAVENOUS | Status: AC
  Administered 2012-04-24: 1 mg via INTRAVENOUS
  Filled 2012-04-24: qty 1

## 2012-04-24 MED ORDER — ONDANSETRON 16 MG/50ML IVPB (CHCC)
16.0000 mg | Freq: Once | INTRAVENOUS | Status: AC
  Administered 2012-04-24: 16 mg via INTRAVENOUS

## 2012-04-24 MED ORDER — SODIUM CHLORIDE 0.9 % IJ SOLN
10.0000 mL | INTRAMUSCULAR | Status: DC | PRN
  Administered 2012-04-24: 10 mL
  Filled 2012-04-24: qty 10

## 2012-04-24 MED ORDER — SODIUM CHLORIDE 0.9 % IV SOLN
375.0000 mg/m2 | Freq: Once | INTRAVENOUS | Status: AC
  Administered 2012-04-24: 500 mg via INTRAVENOUS
  Filled 2012-04-24: qty 50

## 2012-04-24 MED ORDER — DEXAMETHASONE SODIUM PHOSPHATE 4 MG/ML IJ SOLN
20.0000 mg | Freq: Once | INTRAMUSCULAR | Status: AC
  Administered 2012-04-24: 20 mg via INTRAVENOUS

## 2012-04-24 MED ORDER — DOXORUBICIN HCL CHEMO IV INJECTION 2 MG/ML
25.0000 mg/m2 | Freq: Once | INTRAVENOUS | Status: AC
  Administered 2012-04-24: 34 mg via INTRAVENOUS
  Filled 2012-04-24: qty 17

## 2012-04-24 MED ORDER — DIPHENHYDRAMINE HCL 25 MG PO CAPS
50.0000 mg | ORAL_CAPSULE | Freq: Once | ORAL | Status: AC
  Administered 2012-04-24: 50 mg via ORAL

## 2012-04-24 MED ORDER — SODIUM CHLORIDE 0.9 % IV SOLN
Freq: Once | INTRAVENOUS | Status: AC
  Administered 2012-04-24: 10:00:00 via INTRAVENOUS

## 2012-04-24 MED ORDER — SODIUM CHLORIDE 0.9 % IV SOLN
375.0000 mg/m2 | Freq: Once | INTRAVENOUS | Status: AC
  Administered 2012-04-24: 500 mg via INTRAVENOUS
  Filled 2012-04-24: qty 25

## 2012-04-24 MED ORDER — HEPARIN SOD (PORK) LOCK FLUSH 100 UNIT/ML IV SOLN
500.0000 [IU] | Freq: Once | INTRAVENOUS | Status: AC | PRN
  Administered 2012-04-24: 500 [IU]
  Filled 2012-04-24: qty 5

## 2012-04-24 NOTE — Patient Instructions (Addendum)
1.  Diagnosis:  Transformed high grade, lymphoma. 2.  Stage:  II. 3.  Treatment recommendation:  6 cycles of R-CHOP. Due to transformed status, it is high risk.  Therefore, I recommend 6 cycles.   Due to borderline performance status, we can start out with mini R-CHOP (lower dose of chemo at 50% from baseline); and then if tolerated well, we can increase the dose up slowly. 4.  Potential side effects of R-CHOP include but not limited to infusion reaction, rare seizure disorder, reactivated hepatitis, fatigue, low blood count, hair loss, mouth sore, infection, bleeding, irritated bladder lining with blood in urine, constipation, numbness and tingling of fingers and toes, secondary cancer, congestive heart failure.  5.  Assessment of response:  Clinically with exam and repeat scan after the 6th cycle of chemo.

## 2012-04-24 NOTE — Progress Notes (Signed)
Enrolled pt in the [employer] First Step program.  I faxed application and activated card today. °

## 2012-04-24 NOTE — Progress Notes (Signed)
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   PAST THERAPY: For history of stage IV, low grade but high risk follicular lymphoma: 3 cycles of Bendamustine/Rituxan between Jan 2013 and March 2013. She stopped chemo due to severe pain from compression fracture and severe bone pain.   CURRENT THERAPY: due to start mini-RCHOP today 04/24/2012; with goal to increase chemo dose from 50% "mini" dosing to regular dosing after the 1st cycle; goal for 6 cycles total.   INTERVAL HISTORY: Colleen Cooke 77 y.o. female returns for regular follow up with her husband.  She reports feeling relatively well.  She still has right level II cervical neck node that is mildly tender to palpation.  She tries to push herself to eat.  She has a few pounds weight gain from last visit.  She denied adenopathy elsewhere.  She has chronic back pain from history of compression fracture.  She takes morphine sulfate IR once or twice daily.  She has mild fatigue; however, she is independent of household chores.   Patient denies fever, anorexia, weight loss, headache, visual changes, confusion, drenching night sweats, mucositis, odynophagia, dysphagia, nausea vomiting, jaundice, chest pain, palpitation, shortness of breath, dyspnea on exertion, productive cough, gum bleeding, epistaxis, hematemesis, hemoptysis, abdominal pain, abdominal swelling, early satiety, melena, hematochezia, hematuria, skin rash, spontaneous bleeding, joint swelling, joint pain, heat or cold intolerance, bowel bladder incontinence, back pain, focal motor weakness, paresthesia, depression, suicidal or homicidal ideation, feeling hopelessness.   Past Medical History  Diagnosis Date  . Chicken pox   . 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 Non-Hodgkin's lymphoma, Burkitt's       Jan. 2013  . Back pain     pt has compression fractures in back  . Wears glasses   . Wears dentures     upper and lower  . Diffuse large B cell lymphoma 04/13/2012    Past Surgical History  Procedure Date  . Appendectomy     age 42  . Tonsillectomy     age 39  . Mass biopsy 04/04/2012    Procedure: NECK MASS BIOPSY;  Surgeon: Drema Halon, MD;  Location: [facility];  Service: ENT;  Laterality: Right;    Current Outpatient Prescriptions  Medication Sig Dispense Refill  . acetaminophen (TYLENOL) 500 MG tablet Take 1,000 mg by mouth every morning.      Marland Kitchen alendronate (FOSAMAX) 70 MG tablet Take 70 mg by mouth every Sunday. Take with a full glass of water on an empty stomach.      . Calcium Carbonate (CALCIUM 600 PO) Take 630 mg by mouth daily.      . cholecalciferol (VITAMIN D) 1000 UNITS tablet Take 1,000 Units by mouth daily.      . Diphenhyd-Hydrocort-Nystatin SUSP Swish and spit 15 mLs 4 (four) times daily as needed (Swish/gargle and spit as needed for mouth soreness).  500 mL  2  . gabapentin (NEURONTIN) 100 MG capsule Take 1 capsule (100 mg total) by mouth 3 (three) times daily.  90 capsule  3  . lidocaine-prilocaine (EMLA) cream Apply topically as needed. Put it on portacath one hour before access.  30 g  2  . mirtazapine (REMERON) 7.5 MG tablet Take 7.5 mg by mouth at bedtime.      Marland Kitchen morphine (MSIR) 15 MG tablet Take 1 tablet (15  mg total) by mouth every 6 (six) hours as needed for pain.  90 tablet  0  . ondansetron (ZOFRAN) 8 MG tablet Take 1 tablet (8 mg total) by mouth 2 (two) times daily. Take two times a day starting the day after chemo for 3 days. Then take two times a day as needed for nausea or vomiting.  30 tablet  1  . OVER THE COUNTER MEDICATION Take 1,000 mg by mouth daily. Emergency vitamin c powder      . polyethylene glycol (MIRALAX / GLYCOLAX) packet Take 17 g by mouth every morning.      . predniSONE (DELTASONE) 20 MG tablet Take 2  tablets (40 mg total) by mouth daily. Take on days 1-5 of chemotherapy.  10 tablet  5  . prochlorperazine (COMPAZINE) 10 MG tablet Take 1 tablet (10 mg total) by mouth every 6 (six) hours as needed (Nausea or vomiting).  30 tablet  6   No current facility-administered medications for this visit.   Facility-Administered Medications Ordered in Other Visits  Medication Dose Route Frequency Provider Last Rate Last Dose  . 0.9 %  sodium chloride infusion   Intravenous Once Exie Parody, MD 20 mL/hr at 04/24/12 1018    . acetaminophen (TYLENOL) tablet 650 mg  650 mg Oral Once Exie Parody, MD   650 mg at 04/24/12 1057  . cyclophosphamide (CYTOXAN) 500 mg in sodium chloride 0.9 % 250 mL chemo infusion  375 mg/m2 (Treatment Plan Actual) Intravenous Once Exie Parody, MD   500 mg at 04/24/12 1156  . dexamethasone (DECADRON) injection 20 mg  20 mg Intravenous Once Exie Parody, MD   20 mg at 04/24/12 1019  . diphenhydrAMINE (BENADRYL) capsule 50 mg  50 mg Oral Once Exie Parody, MD   50 mg at 04/24/12 1057  . DOXOrubicin (ADRIAMYCIN) chemo injection 34 mg  25 mg/m2 (Treatment Plan Actual) Intravenous Once Exie Parody, MD   34 mg at 04/24/12 1112  . heparin lock flush 100 unit/mL  500 Units Intracatheter Once PRN Exie Parody, MD      . ondansetron (ZOFRAN) IVPB 16 mg  16 mg Intravenous Once Exie Parody, MD   16 mg at 04/24/12 1019  . riTUXimab (RITUXAN) 500 mg in sodium chloride 0.9 % 250 mL chemo infusion  375 mg/m2 (Treatment Plan Actual) Intravenous Once Exie Parody, MD 33 mL/hr at 04/24/12 1246 500 mg at 04/24/12 1246  . sodium chloride 0.9 % injection 10 mL  10 mL Intracatheter PRN Exie Parody, MD      . vinCRIStine (ONCOVIN) 1 mg in sodium chloride 0.9 % 50 mL chemo infusion  1 mg Intravenous Once Exie Parody, MD   1 mg at 04/24/12 1134    ALLERGIES:  is allergic to penicillins.  REVIEW OF SYSTEMS:  The rest of the 14-point review of system was negative.   Filed Vitals:   04/24/12 0925  BP: 144/70  Pulse: 85  Temp: 97.9  F (36.6 C)  Resp: 18   Wt Readings from Last 3 Encounters:  04/24/12 95 lb 14.4 oz (43.5 kg)  04/18/12 90 lb (40.824 kg)  04/04/12 90 lb 2 oz (40.88 kg)   ECOG Performance status: 1  PHYSICAL EXAMINATION:   General: Thin-appearing woman, in no acute distress. Eyes: no scleral icterus. ENT: There were no oropharyngeal lesions. Neck was without thyromegaly. Lymphatics: Positive for right level II neck node about 3 cm; hard, tender to  palpation. Negative for supraclavicular, inguinal, axillary adenopathy. Respiratory: lungs were clear bilaterally without wheezing or crackles. Cardiovascular: Regular rate and rhythm, S1/S2, without murmur, rub or gallop. There was no pedal edema. GI: abdomen was soft, flat, nontender, nondistended, without organomegaly. Muscoloskeletal: no spinal tenderness of palpation of vertebral spine. Skin exam was without echymosis, petichae. Neuro exam was nonfocal. Patient was able to get on and off exam table without assistance. Gait was normal. Patient was alerted and oriented. Attention was good. Language was appropriate. Mood was normal without depression. Speech was not pressured. Thought content was not tangential.     LABORATORY/RADIOLOGY DATA:  Lab Results  Component Value Date   WBC 5.7 04/24/2012   HGB 11.6 04/24/2012   HCT 34.1* 04/24/2012   PLT 192 04/24/2012   GLUCOSE 115* 04/13/2012   ALKPHOS 114 04/13/2012   ALT 37 04/13/2012   AST 32 04/13/2012   NA 134* 04/13/2012   K 4.7 04/13/2012   CL 101 04/13/2012   CREATININE 0.8 04/13/2012   BUN 15.0 04/13/2012   CO2 26 04/13/2012   INR 0.88 04/18/2012    Nm Pet Image Restag (ps) Skull Base To Thigh  03/28/2012  *RADIOLOGY REPORT*  Clinical Data: Subsequent treatment strategy for lymphoma.  NUCLEAR MEDICINE PET SKULL BASE TO THIGH  Fasting Blood Glucose:  92  Technique:  17.5 mCi F-18 FDG was injected intravenously. CT data was obtained and used for attenuation correction and anatomic localization  only.  (This was not acquired as a diagnostic CT examination.) Additional exam technical data entered on technologist worksheet.  Comparison:  PET CT 10/12/2011  Findings:  Neck: There is a new intense hypermetabolic lymph node in the right neck posterior to the angle of the jaw and  beneath the skin measuring 2.1 x 1.8 cm (image 25) with intense metabolic activity ( SUV max = 10.2) .  There is a smaller adjacent hypermetabolic lymph node measuring 10 mm (image 25) with equal  metabolic intensity.  There are lymph nodes in the paraspinal deep posterior neck which are hypermetabolic on the left and right (image 27).  There is an intensely  hypermetabolic right supraclavicular node in the lateral inferior right neck measuring 13 mm x 17 mm (image 51) with SUV max = 21.3.  There are several foci of metabolic activity along the transverse processes of the lower cervical spine without clear lymph nodes.  Chest:  No hypermetabolic mediastinal lymph nodes.  No suspicious pulmonary nodules.  Interval resolution of hypermetabolic activity in the left lower lobe described on comparison PET CT scan.  Abdomen/Pelvis:  None of hypermetabolic abdominal pelvic lymph nodes.  The spleen is normal. Small inguinal lymph nodes have no significant hypermetabolic activity.  Skeleton:  There are multiple levels of compression fractures throughout the thoracic and lumbar spine.  No hypermetabolic activity to suggest metastasis.  IMPRESSION:  1. High-grade lymphoma recurrence with intensely hypermetabolic lymph nodes in the right neck.  These lymph nodes are superficial and assessable to biopsy. 2.  No evidence of thoracic metastasis or abdominal metastasis.   Original Report Authenticated By: Genevive Bi, M.D.     ASSESSMENT AND PLAN:    1. Diagnosis: Transformation of follicular lymphoma now to diffuse large B-cell lymphoma (aggressive lymphoma).  - Prognosis:  Even though stage II, she has high risk feature with elevated LDH.    - Treatment recommendation:  6 cycles of miniR-CHOP with Cytoxan and Adriamycin dose 50% attenuated at the start.  If she does well, we will  increase the chemo by 10-20% every cycle depends on how she tolerates chemo.  When she was on bendamustine/Rituxan last year, she had tough time while with compression back fracture.  I therefore recommended starting out at 50% dose.    2. Right worse than left upper back pain: Due to compression fractures. She is on MSIR per Dr Caryl Never.  I advised her to continue on Fosamax, calcium, and vitamin D.   3. HTN: Now no longer hypertensive due to weight loss   4. Grade 1 anemia: most likely anemia of chronic disease.  Her bone marrow biopsy did not show iron defifi  5. Mildcalorie-protein malnutrion and weight loss: Appetite is better and slowly gaining weight on Remeron.  6.  Follow up in 3 weeks before the 2nd cycle of chemo.  She has mid cycle visit in about 10 days.     The length of time of the face-to-face encounter was 40 minutes. More than 50% of time was spent counseling and coordination of care.

## 2012-04-24 NOTE — Patient Instructions (Addendum)
[facility] Discharge Instructions for Patients Receiving Chemotherapy  Today you received the following chemotherapy agents vincristine, adriamycin, cytoxan, rituxan  To help prevent nausea and vomiting after your treatment, we encourage you to take your nausea medication as ordered Begin taking it at 4pm and take it as often as prescribed for the next 48 hours as ordered and if needed.   If you develop nausea and vomiting that is not controlled by your nausea medication, call the clinic. If it is after clinic hours your family physician or the after hours number for the clinic or go to the Emergency Department.   BELOW ARE SYMPTOMS THAT SHOULD BE REPORTED IMMEDIATELY:  *FEVER GREATER THAN 100.5 F  *CHILLS WITH OR WITHOUT FEVER  NAUSEA AND VOMITING THAT IS NOT CONTROLLED WITH YOUR NAUSEA MEDICATION  *UNUSUAL SHORTNESS OF BREATH  *UNUSUAL BRUISING OR BLEEDING  TENDERNESS IN MOUTH AND THROAT WITH OR WITHOUT PRESENCE OF ULCERS  *URINARY PROBLEMS  *BOWEL PROBLEMS  UNUSUAL RASH Items with * indicate a potential emergency and should be followed up as soon as possible.  One of the nurses will contact you 24 hours after your treatment. Please let the nurse know about any problems that you may have experienced. Feel free to call the clinic you have any questions or concerns. The clinic phone number is [phone].   I have been informed and understand all the instructions given to me. I know to contact the clinic, my physician, or go to the Emergency Department if any problems should occur. I do not have any questions at this time, but understand that I may call the clinic during office hours   should I have any questions or need assistance in obtaining follow up care.    __________________________________________  _____________  __________ Signature of Patient or Authorized Representative            Date                    Time    __________________________________________ Nurse's Signature

## 2012-04-24 NOTE — Telephone Encounter (Signed)
Printed and gv pt appt schedule for NOV °

## 2012-04-25 ENCOUNTER — Ambulatory Visit (HOSPITAL_BASED_OUTPATIENT_CLINIC_OR_DEPARTMENT_OTHER): Payer: Medicare HMO

## 2012-04-25 ENCOUNTER — Telehealth: Payer: Self-pay | Admitting: *Deleted

## 2012-04-25 VITALS — BP 129/58 | HR 96 | Temp 96.8°F

## 2012-04-25 DIAGNOSIS — C8330 Diffuse large B-cell lymphoma, unspecified site: Secondary | ICD-10-CM

## 2012-04-25 DIAGNOSIS — C8589 Other specified types of non-Hodgkin lymphoma, extranodal and solid organ sites: Secondary | ICD-10-CM

## 2012-04-25 MED ORDER — PEGFILGRASTIM INJECTION 6 MG/0.6ML
6.0000 mg | Freq: Once | SUBCUTANEOUS | Status: AC
  Administered 2012-04-25: 6 mg via SUBCUTANEOUS
  Filled 2012-04-25: qty 0.6

## 2012-04-25 NOTE — Telephone Encounter (Signed)
Ms. Bywater is doing well.  No complaints or problems. Reports he urine has been clear since yesterday morning.  Asked that she continue drinking 64 oz fluids daily.  Denies questions at this time.

## 2012-04-25 NOTE — Telephone Encounter (Signed)
Message copied by Augusto Garbe on Tue Apr 25, 2012  3:58 PM ------      Message from: Gaylord Shih      Created: Mon Apr 24, 2012  4:30 PM      Regarding: chemo follow up      Contact: [phone]       R-CHOP, had rituxan before. Dr Gaylyn Rong.

## 2012-04-26 ENCOUNTER — Encounter: Payer: Self-pay | Admitting: Oncology

## 2012-05-02 ENCOUNTER — Encounter: Payer: Self-pay | Admitting: Oncology

## 2012-05-04 ENCOUNTER — Other Ambulatory Visit (HOSPITAL_BASED_OUTPATIENT_CLINIC_OR_DEPARTMENT_OTHER): Payer: Medicare HMO | Admitting: Lab

## 2012-05-04 ENCOUNTER — Encounter: Payer: Self-pay | Admitting: Oncology

## 2012-05-04 ENCOUNTER — Ambulatory Visit (HOSPITAL_BASED_OUTPATIENT_CLINIC_OR_DEPARTMENT_OTHER): Payer: Medicare HMO | Admitting: Oncology

## 2012-05-04 VITALS — BP 132/38 | HR 99 | Temp 98.0°F | Resp 18 | Ht 65.0 in | Wt 94.8 lb

## 2012-05-04 DIAGNOSIS — C8589 Other specified types of non-Hodgkin lymphoma, extranodal and solid organ sites: Secondary | ICD-10-CM

## 2012-05-04 DIAGNOSIS — C8330 Diffuse large B-cell lymphoma, unspecified site: Secondary | ICD-10-CM

## 2012-05-04 DIAGNOSIS — D649 Anemia, unspecified: Secondary | ICD-10-CM

## 2012-05-04 DIAGNOSIS — C8590 Non-Hodgkin lymphoma, unspecified, unspecified site: Secondary | ICD-10-CM

## 2012-05-04 DIAGNOSIS — E441 Mild protein-calorie malnutrition: Secondary | ICD-10-CM

## 2012-05-04 DIAGNOSIS — M549 Dorsalgia, unspecified: Secondary | ICD-10-CM

## 2012-05-04 LAB — LACTATE DEHYDROGENASE (CC13): LDH: 261 U/L — ABNORMAL HIGH (ref 125–245)

## 2012-05-04 LAB — BASIC METABOLIC PANEL (CC13)
BUN: 10 mg/dL (ref 7.0–26.0)
CO2: 29 mEq/L (ref 22–29)
Chloride: 103 mEq/L (ref 98–107)
Creatinine: 0.8 mg/dL (ref 0.6–1.1)
Glucose: 95 mg/dl (ref 70–99)
Potassium: 4.1 mEq/L (ref 3.5–5.1)

## 2012-05-04 LAB — CBC WITH DIFFERENTIAL/PLATELET
Basophils Absolute: 0.1 10*3/uL (ref 0.0–0.1)
Eosinophils Absolute: 0.2 10*3/uL (ref 0.0–0.5)
HCT: 30 % — ABNORMAL LOW (ref 34.8–46.6)
HGB: 10.3 g/dL — ABNORMAL LOW (ref 11.6–15.9)
MCH: 34.8 pg — ABNORMAL HIGH (ref 25.1–34.0)
NEUT#: 12.8 10*3/uL — ABNORMAL HIGH (ref 1.5–6.5)
NEUT%: 84.8 % — ABNORMAL HIGH (ref 38.4–76.8)
RDW: 14.3 % (ref 11.2–14.5)
lymph#: 0.5 10*3/uL — ABNORMAL LOW (ref 0.9–3.3)

## 2012-05-04 NOTE — Progress Notes (Signed)
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   PAST THERAPY: For history of stage IV, low grade but high risk follicular lymphoma: 3 cycles of Bendamustine/Rituxan between Jan 2013 and March 2013. She stopped chemo due to severe pain from compression fracture and severe bone pain.   CURRENT THERAPY:\mini-RCHOP started on 04/24/2012; with goal to increase chemo dose from 50% "mini" dosing to regular dosing after the 1st cycle; goal for 6 cycles total.   INTERVAL HISTORY: Colleen Cooke 77 y.o. female returns for mid-cycle check by herself.  She reports feeling relatively well.  She still has right level II cervical neck node that is mildly tender to palpation; this is significantly smaller than prior to chemo.  She is eating well. Weight is stable. She denied adenopathy elsewhere.  She has chronic back pain from history of compression fracture.  She takes morphine sulfate IR once or twice daily.  She thinks her back pain is better overall. She has mild fatigue; however, she is independent of household chores. She is able to work out at the gym several times per week.  Patient denies fever, anorexia, weight loss, headache, visual changes, confusion, drenching night sweats, mucositis, odynophagia, dysphagia, nausea vomiting, jaundice, chest pain, palpitation, shortness of breath, dyspnea on exertion, productive cough, gum bleeding, epistaxis, hematemesis, hemoptysis, abdominal pain, abdominal swelling, early satiety, melena, hematochezia, hematuria, skin rash, spontaneous bleeding, joint swelling, joint pain, heat or cold intolerance, bowel bladder incontinence, back pain, focal motor weakness, paresthesia, depression, suicidal or homicidal ideation, feeling hopelessness.   Past Medical History  Diagnosis Date   . Chicken pox   . 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 Non-Hodgkin's lymphoma, Burkitt's     Jan. 2013  . Back pain     pt has compression fractures in back  . Wears glasses   . Wears dentures     upper and lower  . Diffuse large B cell lymphoma 04/13/2012    Past Surgical History  Procedure Date  . Appendectomy     age 66  . Tonsillectomy     age 89  . Mass biopsy 04/04/2012    Procedure: NECK MASS BIOPSY;  Surgeon: Drema Halon, MD;  Location: [facility];  Service: ENT;  Laterality: Right;    Current Outpatient Prescriptions  Medication Sig Dispense Refill  . acetaminophen (TYLENOL) 500 MG tablet Take 1,000 mg by mouth every morning.      Marland Kitchen alendronate (FOSAMAX) 70 MG tablet Take 70 mg by mouth every Sunday. Take with a full glass of water on an empty stomach.      . Calcium Carbonate (CALCIUM 600 PO) Take 630 mg by mouth daily.      . cholecalciferol (VITAMIN D) 1000 UNITS tablet Take 1,000 Units by mouth daily.      . Diphenhyd-Hydrocort-Nystatin SUSP Swish and spit 15 mLs 4 (four) times daily as needed (Swish/gargle and spit as needed for mouth soreness).  500 mL  2  . gabapentin (NEURONTIN) 100 MG capsule Take 1 capsule (100 mg total) by mouth 3 (three) times daily.  90 capsule  3  . lidocaine-prilocaine (EMLA) cream Apply topically as needed. Put it on portacath one hour before access.  30 g  2  . mirtazapine (REMERON) 7.5 MG tablet Take 7.5 mg by mouth at bedtime.      Marland Kitchen  morphine (MSIR) 15 MG tablet Take 1 tablet (15 mg total) by mouth every 6 (six) hours as needed for pain.  90 tablet  0  . ondansetron (ZOFRAN) 8 MG tablet Take 1 tablet (8 mg total) by mouth 2 (two) times daily. Take two times a day starting the day after chemo for 3 days. Then take two times a day as needed for nausea or vomiting.  30 tablet  1  . OVER THE COUNTER MEDICATION Take 1,000 mg by mouth daily. Emergency vitamin c powder      . polyethylene glycol (MIRALAX / GLYCOLAX) packet  Take 17 g by mouth every morning.      . predniSONE (DELTASONE) 20 MG tablet Take 2 tablets (40 mg total) by mouth daily. Take on days 1-5 of chemotherapy.  10 tablet  5  . prochlorperazine (COMPAZINE) 10 MG tablet Take 1 tablet (10 mg total) by mouth every 6 (six) hours as needed (Nausea or vomiting).  30 tablet  6    ALLERGIES:  is allergic to penicillins.  REVIEW OF SYSTEMS:  The rest of the 14-point review of system was negative.   Filed Vitals:   05/04/12 0907  BP: 132/38  Pulse: 99  Temp: 98 F (36.7 C)  Resp: 18   Wt Readings from Last 3 Encounters:  05/04/12 94 lb 12.8 oz (43.001 kg)  04/24/12 95 lb 14.4 oz (43.5 kg)  04/18/12 90 lb (40.824 kg)   ECOG Performance status: 1  PHYSICAL EXAMINATION:   General: Thin-appearing woman, in no acute distress. Eyes: no scleral icterus. ENT: There were no oropharyngeal lesions. Neck was without thyromegaly. Lymphatics: Positive for right level II neck node about 1 cm; hard, tender to palpation. Negative for supraclavicular, inguinal, axillary adenopathy. Respiratory: lungs were clear bilaterally without wheezing or crackles. Cardiovascular: Regular rate and rhythm, S1/S2, without murmur, rub or gallop. There was no pedal edema. GI: abdomen was soft, flat, nontender, nondistended, without organomegaly. Muscoloskeletal: no spinal tenderness of palpation of vertebral spine. Skin exam was without echymosis, petichae. Neuro exam was nonfocal. Patient was able to get on and off exam table without assistance. Gait was normal. Patient was alerted and oriented. Attention was good. Language was appropriate. Mood was normal without depression. Speech was not pressured. Thought content was not tangential.     LABORATORY/RADIOLOGY DATA:  Lab Results  Component Value Date   WBC 15.1* 05/04/2012   HGB 10.3* 05/04/2012   HCT 30.0* 05/04/2012   PLT 86* 05/04/2012   GLUCOSE 100* 04/24/2012   ALKPHOS 124 04/24/2012   ALT 25 04/24/2012   AST 30  04/24/2012   NA 139 04/24/2012   K 4.3 04/24/2012   CL 105 04/24/2012   CREATININE 0.7 04/24/2012   BUN 12.0 04/24/2012   CO2 25 04/24/2012   INR 0.88 04/18/2012    ASSESSMENT AND PLAN:    1. Diagnosis: Transformation of follicular lymphoma now to diffuse large B-cell lymphoma (aggressive lymphoma).  - Prognosis:  Even though stage II, she has high risk feature with elevated LDH.  - She is on miniR-CHOP with Cytoxan and Adriamycin dose 50% attenuated with cycle 1. She is tolerating chemotherapy well at this point.  If she does well, we will increase the chemo by 10-20% every cycle depends on how she tolerates chemo.  When she was on bendamustine/Rituxan last year, she had tough time while with compression back fracture.   2. Right worse than left upper back pain: Due to compression fractures. She is on  MSIR per Dr Caryl Never.  I advised her to continue on Fosamax, calcium, and vitamin D. Pain is better today.  3. HTN: Now no longer hypertensive due to weight loss   4. Grade 1 anemia: most likely anemia of chronic disease.  Her bone marrow biopsy did not show iron deficiency.   5. Mildcalorie-protein malnutrion and weight loss: Appetite is better and slowly gaining weight on Remeron.  6.  Follow up on 11/15 prior to cycle 2.   The length of time of the face-to-face encounter was 15 minutes. More than 50% of time was spent counseling and coordination of care.

## 2012-05-04 NOTE — Patient Instructions (Addendum)
Mouth rinse:   - salt/baking soda (1 teaspoon of salt; 1 teaspoon of baking soda; in 1 Liter of water).   

## 2012-05-10 ENCOUNTER — Ambulatory Visit (INDEPENDENT_AMBULATORY_CARE_PROVIDER_SITE_OTHER): Payer: Medicare HMO | Admitting: Family Medicine

## 2012-05-10 ENCOUNTER — Encounter: Payer: Self-pay | Admitting: Family Medicine

## 2012-05-10 VITALS — BP 130/70 | Temp 97.8°F | Wt 97.5 lb

## 2012-05-10 DIAGNOSIS — I10 Essential (primary) hypertension: Secondary | ICD-10-CM

## 2012-05-10 DIAGNOSIS — C8590 Non-Hodgkin lymphoma, unspecified, unspecified site: Secondary | ICD-10-CM

## 2012-05-10 DIAGNOSIS — M810 Age-related osteoporosis without current pathological fracture: Secondary | ICD-10-CM | POA: Insufficient documentation

## 2012-05-10 DIAGNOSIS — C8589 Other specified types of non-Hodgkin lymphoma, extranodal and solid organ sites: Secondary | ICD-10-CM

## 2012-05-10 DIAGNOSIS — M549 Dorsalgia, unspecified: Secondary | ICD-10-CM

## 2012-05-10 NOTE — Progress Notes (Signed)
Subjective:    Patient ID: Colleen Cooke, female    DOB: [birth year], 77 y.o.   MRN: [id number]  HPI  Medical followup. Patient presented here back in September with recurrent right neck adenopathy. Referred back to oncology. Progression of her lymphoma to diffuse large B-cell lymphoma. Undergoing chemotherapy and tolerating fairly well. She was previously getting chemotherapy but stopped because of severe back pain from compression fractures. Overall, her back pain is fairly stable. She is taking morphine 15 mg one to 2 tablets daily and this is controlling her pain fairly well. She was started on Remeron and her appetite has improved somewhat. She's gained a couple pounds since last visit. She is exercising with light exercise at the gym few days per week.  Already had flu vaccine. Pneumovax up-to-date.  History of hypertension. With her weight loss blood pressure issues resolved. No orthostasis. Patient taking Fosamax as well as calcium and vitamin D. No reflux symptoms.  Past Medical History  Diagnosis Date  . Chicken pox   . 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 Non-Hodgkin's lymphoma, Burkitt's     Jan. 2013  . Back pain     pt has compression fractures in back  . Wears glasses   . Wears dentures     upper and lower  . Diffuse large B cell lymphoma 04/13/2012   Past Surgical History  Procedure Date  . Appendectomy     age 42  . Tonsillectomy     age 53  . Mass biopsy 04/04/2012    Procedure: NECK MASS BIOPSY;  Surgeon: Drema Halon, MD;  Location: [facility];  Service: ENT;  Laterality: Right;    reports that she quit smoking about 18 years ago. Her smoking use included Cigarettes. She has a 20 pack-year smoking history. She has never used smokeless tobacco. She reports that she drinks about .6 ounces of alcohol per week. She reports that she does not use illicit drugs. family history includes Coronary artery disease (age of onset:71) in her mother. Allergies    Allergen Reactions  . Penicillins     REACTION: swelling localized      Review of Systems  Constitutional: Negative for appetite change and unexpected weight change.  Respiratory: Negative for cough and shortness of breath.   Cardiovascular: Negative for chest pain.  Gastrointestinal: Negative for nausea, vomiting and abdominal pain.  Genitourinary: Negative for dysuria.  Musculoskeletal: Positive for back pain.  Skin: Negative for rash.  Neurological: Negative for dizziness and headaches.       Objective:   Physical Exam  Constitutional: She is oriented to person, place, and time. She appears well-developed and well-nourished.  HENT:  Mouth/Throat: Oropharynx is clear and moist.  Neck: Neck supple. No thyromegaly present.  Cardiovascular: Normal rate and regular rhythm.   Pulmonary/Chest: Effort normal and breath sounds normal. No respiratory distress. She has no wheezes. She has no rales.  Musculoskeletal: She exhibits no edema.  Lymphadenopathy:    She has no cervical adenopathy.  Neurological: She is alert and oriented to person, place, and time.          Assessment & Plan:  #1 history of hypertension. Resolved with weight loss. Now off medication #2 osteoporosis history. Tolerating Fosamax. Continue calcium and vitamin D  #3 history of back pain with thoracic and lumbar compression fractures. Overall improving. She'll continue as needed morphine #4 history of weight loss. This has stabilized and Improved somewhat with Remeron. #5 diffuse large B-cell lymphoma followed closely by  oncology with current chemotherapy. Flu vaccine and Pneumovax has been given

## 2012-05-12 ENCOUNTER — Telehealth: Payer: Self-pay | Admitting: Oncology

## 2012-05-12 ENCOUNTER — Other Ambulatory Visit (HOSPITAL_BASED_OUTPATIENT_CLINIC_OR_DEPARTMENT_OTHER): Payer: Medicare HMO | Admitting: Lab

## 2012-05-12 ENCOUNTER — Ambulatory Visit (HOSPITAL_BASED_OUTPATIENT_CLINIC_OR_DEPARTMENT_OTHER): Payer: Medicare HMO | Admitting: Oncology

## 2012-05-12 VITALS — BP 115/63 | HR 96 | Temp 97.3°F | Resp 20 | Ht 65.0 in | Wt 97.7 lb

## 2012-05-12 DIAGNOSIS — E441 Mild protein-calorie malnutrition: Secondary | ICD-10-CM

## 2012-05-12 DIAGNOSIS — D649 Anemia, unspecified: Secondary | ICD-10-CM

## 2012-05-12 DIAGNOSIS — C8330 Diffuse large B-cell lymphoma, unspecified site: Secondary | ICD-10-CM

## 2012-05-12 DIAGNOSIS — C8589 Other specified types of non-Hodgkin lymphoma, extranodal and solid organ sites: Secondary | ICD-10-CM

## 2012-05-12 DIAGNOSIS — M1990 Unspecified osteoarthritis, unspecified site: Secondary | ICD-10-CM

## 2012-05-12 LAB — CBC WITH DIFFERENTIAL/PLATELET
BASO%: 0.3 % (ref 0.0–2.0)
Basophils Absolute: 0 10*3/uL (ref 0.0–0.1)
EOS%: 0.8 % (ref 0.0–7.0)
HCT: 28 % — ABNORMAL LOW (ref 34.8–46.6)
HGB: 9.6 g/dL — ABNORMAL LOW (ref 11.6–15.9)
LYMPH%: 4.7 % — ABNORMAL LOW (ref 14.0–49.7)
MCH: 35 pg — ABNORMAL HIGH (ref 25.1–34.0)
MCHC: 34.3 g/dL (ref 31.5–36.0)
MCV: 102.1 fL — ABNORMAL HIGH (ref 79.5–101.0)
MONO%: 10 % (ref 0.0–14.0)
NEUT%: 84.2 % — ABNORMAL HIGH (ref 38.4–76.8)
Platelets: 270 10*3/uL (ref 145–400)
lymph#: 0.5 10*3/uL — ABNORMAL LOW (ref 0.9–3.3)

## 2012-05-12 LAB — COMPREHENSIVE METABOLIC PANEL (CC13)
ALT: 25 U/L (ref 0–55)
AST: 18 U/L (ref 5–34)
BUN: 11 mg/dL (ref 7.0–26.0)
Calcium: 9.9 mg/dL (ref 8.4–10.4)
Chloride: 102 mEq/L (ref 98–107)
Creatinine: 0.7 mg/dL (ref 0.6–1.1)
Total Bilirubin: 0.5 mg/dL (ref 0.20–1.20)

## 2012-05-12 NOTE — Telephone Encounter (Signed)
gv pt appt schedule for November 2013 thru February 2014.

## 2012-05-12 NOTE — Patient Instructions (Addendum)
1.  Transformed large B-cell lymphoma. 2.  Treatment:  6 cycles of R-CHOP.  Five more to go.  I will increase the dose of Adriamycin and Cytoxan from 50% of baseline to 60% of baseline.  3.  Follow up:  In 3 weeks before the 3rd cycle.

## 2012-05-13 NOTE — Progress Notes (Signed)
[facility]  Telephone:(336) [phone] Fax:(336) [phone]   OFFICE PROGRESS NOTE   Cc:  Colleen Covey, MD  DIAGNOSIS: Transformed diffuse large B-cell lymphoma from follicular B-cell NonHodgkin's lymphoma; stage II.   PAST THERAPY: For history of stage IV, low grade but high risk follicular lymphoma: 3 cycles of Bendamustine/Rituxan between Jan 2013 and March 2013. She stopped chemo due to severe pain from compression fracture and severe bone pain.   CURRENT THERAPY: started mini-RCHOP 04/24/2012; goal for 6 cycles total.   INTERVAL HISTORY: Colleen Cooke 77 y.o. female returns for regular follow up for cycle #2 of chemo mini R-CHOP.  She reported that she tolerated chemo therapy well 3 weeks ago.  However, she twisted her back a few days ago, and has had increased back pain, needing more frequent pain meds making her less active.  She had mild mucositis which completely resolved with mouth rinses.  Her appetite has not changed and her weight is stable. She had significant shrinkage of right cervical level II node.  The left cervical level II node only shrank a little bit.  She denied new adenopathy.   Patient denies fever, headache, visual changes, confusion, drenching night sweats, palpable lymph node swelling, odynophagia, dysphagia, nausea vomiting, jaundice, chest pain, palpitation, shortness of breath, dyspnea on exertion, productive cough, gum bleeding, epistaxis, hematemesis, hemoptysis, abdominal pain, abdominal swelling, early satiety, melena, hematochezia, hematuria, skin rash, spontaneous bleeding, joint swelling, joint pain, heat or cold intolerance, bowel bladder incontinence, paresthesia, depression, suicidal or homicidal ideation, feeling hopelessness.   Past Medical History  Diagnosis Date  . Chicken pox   . 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 Non-Hodgkin's lymphoma, Burkitt's     Jan. 2013  . Back pain     pt has compression fractures in back  . Wears  glasses   . Wears dentures     upper and lower  . Diffuse large B cell lymphoma 04/13/2012    Past Surgical History  Procedure Date  . Appendectomy     age 4  . Tonsillectomy     age 3  . Mass biopsy 04/04/2012    Procedure: NECK MASS BIOPSY;  Surgeon: Drema Halon, MD;  Location:  SUR[facility]ERY CENTER;  Service: ENT;  Laterality: Right;    Current Outpatient Prescriptions  Medication Sig Dispense Refill  . acetaminophen (TYLENOL) 500 MG tablet Take 1,000 mg by mouth every morning.      Marland Kitchen alendronate (FOSAMAX) 70 MG tablet Take 70 mg by mouth every Sunday. Take with a full glass of water on an empty stomach.      . Calcium Carbonate (CALCIUM 600 PO) Take 630 mg by mouth daily.      . cholecalciferol (VITAMIN D) 1000 UNITS tablet Take 1,000 Units by mouth daily.      . Diphenhyd-Hydrocort-Nystatin SUSP Swish and spit 15 mLs 4 (four) times daily as needed (Swish/gargle and spit as needed for mouth soreness).  500 mL  2  . gabapentin (NEURONTIN) 100 MG capsule Take 1 capsule (100 mg total) by mouth 3 (three) times daily.  90 capsule  3  . lidocaine-prilocaine (EMLA) cream Apply topically as needed. Put it on portacath one hour before access.  30 g  2  . mirtazapine (REMERON) 7.5 MG tablet Take 7.5 mg by mouth at bedtime.      Marland Kitchen morphine (MSIR) 15 MG tablet Take 1 tablet (15 mg total) by mouth every 6 (six) hours as needed for pain.  90 tablet  0  . ondansetron (ZOFRAN) 8 MG tablet Take 1 tablet (8 mg total) by mouth 2 (two) times daily. Take two times a day starting the day after chemo for 3 days. Then take two times a day as needed for nausea or vomiting.  30 tablet  1  . OVER THE COUNTER MEDICATION Take 1,000 mg by mouth daily. Emergency vitamin c powder      . polyethylene glycol (MIRALAX / GLYCOLAX) packet Take 17 g by mouth every morning.      . predniSONE (DELTASONE) 20 MG tablet Take 2 tablets (40 mg total) by mouth daily. Take on days 1-5 of chemotherapy.  10 tablet  5    . prochlorperazine (COMPAZINE) 10 MG tablet Take 1 tablet (10 mg total) by mouth every 6 (six) hours as needed (Nausea or vomiting).  30 tablet  6    ALLERGIES:  is allergic to penicillins.  REVIEW OF SYSTEMS:  The rest of the 14-point review of system was negative.   Filed Vitals:   05/12/12 0914  BP: 115/63  Pulse: 96  Temp: 97.3 F (36.3 C)  Resp: 20   Wt Readings from Last 3 Encounters:  05/12/12 97 lb 11.2 oz (44.316 kg)  05/10/12 97 lb 8 oz (44.226 kg)  05/04/12 94 lb 12.8 oz (43.001 kg)   ECOG Performance status: 1  PHYSICAL EXAMINATION:   General: Thin-appearing woman, in no acute distress. Eyes: no scleral icterus. ENT: There were no oropharyngeal lesions. Neck was without thyromegaly. Lymphatics: Positive for left level II neck node about 2 cm; hard, tender to palpation.  The right cervical level II node was no longer palpable.  Negative for supraclavicular, inguinal, axillary adenopathy. Respiratory: lungs were clear bilaterally without wheezing or crackles. Cardiovascular: Regular rate and rhythm, S1/S2, without murmur, rub or gallop. There was no pedal edema. GI: abdomen was soft, flat, nontender, nondistended, without organomegaly. Muscoloskeletal: no spinal tenderness of palpation of vertebral spine. Skin exam was without echymosis, petichae. Neuro exam was nonfocal. Patient was able to get on and off exam table without assistance. Gait was measured.  She needed a cane to assister her ambulation. Patient was alerted and oriented. Attention was good. Language was appropriate. Mood was normal without depression. Speech was not pressured. Thought content was not tangential.        LABORATORY/RADIOLOGY DATA:  Lab Results  Component Value Date   WBC 11.1* 05/12/2012   HGB 9.6* 05/12/2012   HCT 28.0* 05/12/2012   PLT 270 05/12/2012   GLUCOSE 129* 05/12/2012   ALKPHOS 209* 05/12/2012   ALT 25 05/12/2012   AST 18 05/12/2012   NA 136 05/12/2012   K 4.8 05/12/2012    CL 102 05/12/2012   CREATININE 0.7 05/12/2012   BUN 11.0 05/12/2012   CO2 28 05/12/2012   INR 0.88 04/18/2012     ASSESSMENT AND PLAN:  1. Diagnosis: Transformation of follicular lymphoma now to diffuse large B-cell lymphoma - She is s/p 1 cycle of mini R-CHOP.  She has grade 1 anemia.  She has grade 2 fatigue but this is due to recent exacerbation of her back pain more than from chemo.  She had grade 1 mucositis.   - I recommended to proceed with cycle #2 of chemo the same dose with cycle 1 meaning keeping the dose of Cytoxan and Adriamycin 50% of baseline.  I'm concerned that increasing the dose of these two chemo now while she is a little frail from her back pain will cause  more problems.  She expressed informed understanding and agreed with this recommendation.  If she does will with #2, and her performance status improves, I may consider increasing of the dose of chemo with subsequent cycles.   2. Osteoarhritis: Due to compression fractures. She is on MSIR per Dr Caryl Never. She is also on Fosamax, calcium, and vitamin D.   3. Grade 1 anemia: most likely anemia of chemo.  There is no active bleeding.  Past work up did not show iron deficiency.  There is no indication for blood transfusion.   4. Mildcalorie-protein malnutrion and weight loss: Appetite is better and slowly gaining weight on Remeron.   5. Follow up in 3 weeks before the 3rd cycle of chemo.

## 2012-05-15 ENCOUNTER — Ambulatory Visit (HOSPITAL_BASED_OUTPATIENT_CLINIC_OR_DEPARTMENT_OTHER): Payer: Medicare HMO

## 2012-05-15 ENCOUNTER — Other Ambulatory Visit: Payer: Medicare HMO | Admitting: Lab

## 2012-05-15 VITALS — BP 108/54 | HR 96 | Temp 97.6°F

## 2012-05-15 DIAGNOSIS — Z5112 Encounter for antineoplastic immunotherapy: Secondary | ICD-10-CM

## 2012-05-15 DIAGNOSIS — Z5111 Encounter for antineoplastic chemotherapy: Secondary | ICD-10-CM

## 2012-05-15 DIAGNOSIS — C8330 Diffuse large B-cell lymphoma, unspecified site: Secondary | ICD-10-CM

## 2012-05-15 DIAGNOSIS — C8589 Other specified types of non-Hodgkin lymphoma, extranodal and solid organ sites: Secondary | ICD-10-CM

## 2012-05-15 MED ORDER — SODIUM CHLORIDE 0.9 % IV SOLN
375.0000 mg/m2 | Freq: Once | INTRAVENOUS | Status: AC
  Administered 2012-05-15: 500 mg via INTRAVENOUS
  Filled 2012-05-15: qty 25

## 2012-05-15 MED ORDER — SODIUM CHLORIDE 0.9 % IV SOLN
375.0000 mg/m2 | Freq: Once | INTRAVENOUS | Status: AC
  Administered 2012-05-15: 500 mg via INTRAVENOUS
  Filled 2012-05-15: qty 50

## 2012-05-15 MED ORDER — ACETAMINOPHEN 325 MG PO TABS
650.0000 mg | ORAL_TABLET | Freq: Once | ORAL | Status: AC
  Administered 2012-05-15: 650 mg via ORAL

## 2012-05-15 MED ORDER — DIPHENHYDRAMINE HCL 25 MG PO CAPS
50.0000 mg | ORAL_CAPSULE | Freq: Once | ORAL | Status: AC
  Administered 2012-05-15: 50 mg via ORAL

## 2012-05-15 MED ORDER — HEPARIN SOD (PORK) LOCK FLUSH 100 UNIT/ML IV SOLN
500.0000 [IU] | Freq: Once | INTRAVENOUS | Status: AC | PRN
  Administered 2012-05-15: 500 [IU]
  Filled 2012-05-15: qty 5

## 2012-05-15 MED ORDER — VINCRISTINE SULFATE CHEMO INJECTION 1 MG/ML
1.0000 mg | Freq: Once | INTRAVENOUS | Status: AC
  Administered 2012-05-15: 1 mg via INTRAVENOUS
  Filled 2012-05-15: qty 1

## 2012-05-15 MED ORDER — SODIUM CHLORIDE 0.9 % IJ SOLN
10.0000 mL | INTRAMUSCULAR | Status: DC | PRN
  Administered 2012-05-15: 10 mL
  Filled 2012-05-15: qty 10

## 2012-05-15 MED ORDER — SODIUM CHLORIDE 0.9 % IV SOLN
Freq: Once | INTRAVENOUS | Status: AC
  Administered 2012-05-15: 09:00:00 via INTRAVENOUS

## 2012-05-15 MED ORDER — DEXAMETHASONE SODIUM PHOSPHATE 4 MG/ML IJ SOLN
20.0000 mg | Freq: Once | INTRAMUSCULAR | Status: AC
  Administered 2012-05-15: 20 mg via INTRAVENOUS

## 2012-05-15 MED ORDER — DOXORUBICIN HCL CHEMO IV INJECTION 2 MG/ML
25.0000 mg/m2 | Freq: Once | INTRAVENOUS | Status: AC
  Administered 2012-05-15: 34 mg via INTRAVENOUS
  Filled 2012-05-15: qty 17

## 2012-05-15 MED ORDER — ONDANSETRON 16 MG/50ML IVPB (CHCC)
16.0000 mg | Freq: Once | INTRAVENOUS | Status: AC
  Administered 2012-05-15: 16 mg via INTRAVENOUS

## 2012-05-15 NOTE — Patient Instructions (Addendum)
[facility] Discharge Instructions for Patients Receiving Chemotherapy  Today you received the following chemotherapy agents: adriamycin, vincristine, cytoxan, rituxan  To help prevent nausea and vomiting after your treatment, we encourage you to take your nausea medication.  Take it as often as prescribed.     If you develop nausea and vomiting that is not controlled by your nausea medication, call the clinic. If it is after clinic hours your family physician or the after hours number for the clinic or go to the Emergency Department.   BELOW ARE SYMPTOMS THAT SHOULD BE REPORTED IMMEDIATELY:  *FEVER GREATER THAN 100.5 F  *CHILLS WITH OR WITHOUT FEVER  NAUSEA AND VOMITING THAT IS NOT CONTROLLED WITH YOUR NAUSEA MEDICATION  *UNUSUAL SHORTNESS OF BREATH  *UNUSUAL BRUISING OR BLEEDING  TENDERNESS IN MOUTH AND THROAT WITH OR WITHOUT PRESENCE OF ULCERS  *URINARY PROBLEMS  *BOWEL PROBLEMS  UNUSUAL RASH Items with * indicate a potential emergency and should be followed up as soon as possible.  Feel free to call the clinic you have any questions or concerns. The clinic phone number is [phone].   I have been informed and understand all the instructions given to me. I know to contact the clinic, my physician, or go to the Emergency Department if any problems should occur. I do not have any questions at this time, but understand that I may call the clinic during office hours   should I have any questions or need assistance in obtaining follow up care.    __________________________________________  _____________  __________ Signature of Patient or Authorized Representative            Date                   Time    __________________________________________ Nurse's Signature    

## 2012-05-16 ENCOUNTER — Ambulatory Visit (HOSPITAL_BASED_OUTPATIENT_CLINIC_OR_DEPARTMENT_OTHER): Payer: Medicare HMO

## 2012-05-16 VITALS — BP 112/46 | HR 100 | Temp 96.8°F

## 2012-05-16 DIAGNOSIS — C8589 Other specified types of non-Hodgkin lymphoma, extranodal and solid organ sites: Secondary | ICD-10-CM

## 2012-05-16 DIAGNOSIS — C8330 Diffuse large B-cell lymphoma, unspecified site: Secondary | ICD-10-CM

## 2012-05-16 MED ORDER — PEGFILGRASTIM INJECTION 6 MG/0.6ML
6.0000 mg | Freq: Once | SUBCUTANEOUS | Status: AC
  Administered 2012-05-16: 6 mg via SUBCUTANEOUS
  Filled 2012-05-16: qty 0.6

## 2012-06-02 ENCOUNTER — Other Ambulatory Visit (HOSPITAL_BASED_OUTPATIENT_CLINIC_OR_DEPARTMENT_OTHER): Payer: Medicare HMO | Admitting: Lab

## 2012-06-02 ENCOUNTER — Encounter: Payer: Self-pay | Admitting: Oncology

## 2012-06-02 ENCOUNTER — Ambulatory Visit (HOSPITAL_BASED_OUTPATIENT_CLINIC_OR_DEPARTMENT_OTHER): Payer: Medicare HMO | Admitting: Oncology

## 2012-06-02 VITALS — BP 132/67 | HR 101 | Temp 97.0°F | Resp 20 | Ht 65.0 in | Wt 96.6 lb

## 2012-06-02 DIAGNOSIS — M479 Spondylosis, unspecified: Secondary | ICD-10-CM

## 2012-06-02 DIAGNOSIS — D649 Anemia, unspecified: Secondary | ICD-10-CM

## 2012-06-02 DIAGNOSIS — C8581 Other specified types of non-Hodgkin lymphoma, lymph nodes of head, face, and neck: Secondary | ICD-10-CM

## 2012-06-02 DIAGNOSIS — C8330 Diffuse large B-cell lymphoma, unspecified site: Secondary | ICD-10-CM

## 2012-06-02 DIAGNOSIS — C8589 Other specified types of non-Hodgkin lymphoma, extranodal and solid organ sites: Secondary | ICD-10-CM

## 2012-06-02 LAB — CBC WITH DIFFERENTIAL/PLATELET
BASO%: 0.5 % (ref 0.0–2.0)
HCT: 28.5 % — ABNORMAL LOW (ref 34.8–46.6)
LYMPH%: 5.2 % — ABNORMAL LOW (ref 14.0–49.7)
MCHC: 34.3 g/dL (ref 31.5–36.0)
MCV: 102.8 fL — ABNORMAL HIGH (ref 79.5–101.0)
MONO#: 1 10*3/uL — ABNORMAL HIGH (ref 0.1–0.9)
MONO%: 9.5 % (ref 0.0–14.0)
NEUT%: 83.8 % — ABNORMAL HIGH (ref 38.4–76.8)
Platelets: 319 10*3/uL (ref 145–400)
WBC: 10.9 10*3/uL — ABNORMAL HIGH (ref 3.9–10.3)

## 2012-06-02 LAB — COMPREHENSIVE METABOLIC PANEL (CC13)
ALT: 15 U/L (ref 0–55)
Alkaline Phosphatase: 205 U/L — ABNORMAL HIGH (ref 40–150)
CO2: 28 mEq/L (ref 22–29)
Creatinine: 0.7 mg/dL (ref 0.6–1.1)
Glucose: 107 mg/dl — ABNORMAL HIGH (ref 70–99)
Total Bilirubin: 0.26 mg/dL (ref 0.20–1.20)

## 2012-06-02 NOTE — Progress Notes (Signed)
[facility]  Telephone:(336) [phone] Fax:(336) [phone]   OFFICE PROGRESS NOTE   Cc:  [name], MD  DIAGNOSIS: Transformed diffuse large B-cell lymphoma from follicular B-cell NonHodgkin's lymphoma; stage II.   PAST THERAPY: For history of stage IV, low grade but high risk follicular lymphoma: 3 cycles of Bendamustine/Rituxan between Jan 2013 and March 2013. She stopped chemo due to severe pain from compression fracture and severe bone pain.   CURRENT THERAPY: started mini-RCHOP 04/24/2012; goal for 6 cycles total.   INTERVAL HISTORY: Colleen Cooke 77 y.o. female returns for regular follow up for cycle #3 of chemo mini R-CHOP.  She reported that she tolerated chemo therapy well 3 weeks ago.  Back pain has been persistent, but she is trying to perform exercises for strengthening. Using Morphine once a day. She had mild mucositis which completely resolved with mouth rinses.  Her appetite has not changed and her weight is stable. She had significant shrinkage of right cervical level II node.  The left cervical level II node is no longer palpable.  She denied new adenopathy.   Patient denies fever, headache, visual changes, confusion, drenching night sweats, palpable lymph node swelling, odynophagia, dysphagia, nausea vomiting, jaundice, chest pain, palpitation, shortness of breath, dyspnea on exertion, productive cough, gum bleeding, epistaxis, hematemesis, hemoptysis, abdominal pain, abdominal swelling, early satiety, melena, hematochezia, hematuria, skin rash, spontaneous bleeding, joint swelling, joint pain, heat or cold intolerance, bowel bladder incontinence, paresthesia, depression, suicidal or homicidal ideation, feeling hopelessness.   Past Medical History  Diagnosis Date  . Chicken pox   . 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 Non-Hodgkin's lymphoma, Burkitt's     Jan. 2013  . Back pain     pt has compression fractures in back  . Wears glasses   . Wears  dentures     upper and lower  . Diffuse large B cell lymphoma 04/13/2012    Past Surgical History  Procedure Date  . Appendectomy     age 33  . Tonsillectomy     age 33  . Mass biopsy 04/04/2012    Procedure: NECK MASS BIOPSY;  Surgeon: Drema Halon, MD;  Location: [facility];  Service: ENT;  Laterality: Right;    Current Outpatient Prescriptions  Medication Sig Dispense Refill  . acetaminophen (TYLENOL) 500 MG tablet Take 1,000 mg by mouth every morning.      Marland Kitchen alendronate (FOSAMAX) 70 MG tablet Take 70 mg by mouth every Sunday. Take with a full glass of water on an empty stomach.      . Calcium Carbonate (CALCIUM 600 PO) Take 630 mg by mouth daily.      . cholecalciferol (VITAMIN D) 1000 UNITS tablet Take 1,000 Units by mouth daily.      . Diphenhyd-Hydrocort-Nystatin SUSP Swish and spit 15 mLs 4 (four) times daily as needed (Swish/gargle and spit as needed for mouth soreness).  500 mL  2  . gabapentin (NEURONTIN) 100 MG capsule Take 1 capsule (100 mg total) by mouth 3 (three) times daily.  90 capsule  3  . lidocaine-prilocaine (EMLA) cream Apply topically as needed. Put it on portacath one hour before access.  30 g  2  . mirtazapine (REMERON) 7.5 MG tablet Take 7.5 mg by mouth at bedtime.      Marland Kitchen morphine (MSIR) 15 MG tablet Take 1 tablet (15 mg total) by mouth every 6 (six) hours as needed for pain.  90 tablet  0  . ondansetron (ZOFRAN) 8  MG tablet Take 1 tablet (8 mg total) by mouth 2 (two) times daily. Take two times a day starting the day after chemo for 3 days. Then take two times a day as needed for nausea or vomiting.  30 tablet  1  . OVER THE COUNTER MEDICATION Take 1,000 mg by mouth daily. Emergency vitamin c powder      . polyethylene glycol (MIRALAX / GLYCOLAX) packet Take 17 g by mouth every morning.      . predniSONE (DELTASONE) 20 MG tablet Take 2 tablets (40 mg total) by mouth daily. Take on days 1-5 of chemotherapy.  10 tablet  5  .  prochlorperazine (COMPAZINE) 10 MG tablet Take 1 tablet (10 mg total) by mouth every 6 (six) hours as needed (Nausea or vomiting).  30 tablet  6    ALLERGIES:  is allergic to penicillins.  REVIEW OF SYSTEMS:  The rest of the 14-point review of system was negative.   Filed Vitals:   06/02/12 0903  BP: 132/67  Pulse: 101  Temp: 97 F (36.1 C)  Resp: 20   Wt Readings from Last 3 Encounters:  06/02/12 96 lb 9.6 oz (43.817 kg)  05/12/12 97 lb 11.2 oz (44.316 kg)  05/10/12 97 lb 8 oz (44.226 kg)   ECOG Performance status: 1  PHYSICAL EXAMINATION:   General: Thin-appearing woman, in no acute distress. Eyes: no scleral icterus. ENT: There were no oropharyngeal lesions. Neck was without thyromegaly. Lymphatics: The right and left cervical level II nodes were no longer palpable.  Negative for supraclavicular, inguinal, axillary adenopathy. Respiratory: lungs were clear bilaterally without wheezing or crackles. Cardiovascular: Regular rate and rhythm, S1/S2, without murmur, rub or gallop. There was no pedal edema. GI: abdomen was soft, flat, nontender, nondistended, without organomegaly. Muscoloskeletal: no spinal tenderness of palpation of vertebral spine. Skin exam was without echymosis, petichae. Neuro exam was nonfocal. Patient was able to get on and off exam table without assistance. Gait was measured.  She needed a cane to assister her ambulation. Patient was alerted and oriented. Attention was good. Language was appropriate. Mood was normal without depression. Speech was not pressured. Thought content was not tangential.    LABORATORY/RADIOLOGY DATA:  Lab Results  Component Value Date   WBC 10.9* 06/02/2012   HGB 9.8* 06/02/2012   HCT 28.5* 06/02/2012   PLT 319 06/02/2012   GLUCOSE 129* 05/12/2012   ALKPHOS 209* 05/12/2012   ALT 25 05/12/2012   AST 18 05/12/2012   NA 136 05/12/2012   K 4.8 05/12/2012   CL 102 05/12/2012   CREATININE 0.7 05/12/2012   BUN 11.0 05/12/2012   CO2 28  05/12/2012   INR 0.88 04/18/2012     ASSESSMENT AND PLAN:  1. Diagnosis: Transformation of follicular lymphoma now to diffuse large B-cell lymphoma - She is s/p 2 cycles of mini R-CHOP.  She has grade 1 anemia.  She has grade 2 fatigue but this is due to recent exacerbation of her back pain more than from chemo.  She had grade 1 mucositis.   - I recommended to proceed with cycle #3 of chemo. Will increase Vincristine to full dose (2 mg) and increase the dose of Cytoxan and Adriamycin to 60% of baseline. She expressed informed understanding and agreed with this recommendation.  If she does will with #3, and her performance status improves, I may consider increasing of the dose of chemo with subsequent cycles.   2. Osteoarhritis: Due to compression fractures. She is on MSIR  per Dr Caryl Never. She is also on Fosamax, calcium, and vitamin D.   3. Grade 1 anemia: most likely anemia of chemo.  There is no active bleeding.  Past work up did not show iron deficiency.  There is no indication for blood transfusion.   4. Mildcalorie-protein malnutrion and weight loss: Appetite is better and slowly gaining weight on Remeron.   5. Follow up in 3 weeks before the 4th cycle of chemo.

## 2012-06-05 ENCOUNTER — Ambulatory Visit (HOSPITAL_BASED_OUTPATIENT_CLINIC_OR_DEPARTMENT_OTHER): Payer: Medicare HMO

## 2012-06-05 ENCOUNTER — Other Ambulatory Visit: Payer: Medicare HMO | Admitting: Lab

## 2012-06-05 VITALS — BP 104/52 | HR 97 | Temp 98.2°F | Resp 20

## 2012-06-05 DIAGNOSIS — Z5112 Encounter for antineoplastic immunotherapy: Secondary | ICD-10-CM

## 2012-06-05 DIAGNOSIS — C8330 Diffuse large B-cell lymphoma, unspecified site: Secondary | ICD-10-CM

## 2012-06-05 DIAGNOSIS — C8589 Other specified types of non-Hodgkin lymphoma, extranodal and solid organ sites: Secondary | ICD-10-CM

## 2012-06-05 DIAGNOSIS — Z5111 Encounter for antineoplastic chemotherapy: Secondary | ICD-10-CM

## 2012-06-05 MED ORDER — DIPHENHYDRAMINE HCL 25 MG PO CAPS
50.0000 mg | ORAL_CAPSULE | Freq: Once | ORAL | Status: AC
  Administered 2012-06-05: 50 mg via ORAL

## 2012-06-05 MED ORDER — DOXORUBICIN HCL CHEMO IV INJECTION 2 MG/ML
30.0000 mg/m2 | Freq: Once | INTRAVENOUS | Status: AC
  Administered 2012-06-05: 40 mg via INTRAVENOUS
  Filled 2012-06-05: qty 20

## 2012-06-05 MED ORDER — VINCRISTINE SULFATE CHEMO INJECTION 1 MG/ML
2.0000 mg | Freq: Once | INTRAVENOUS | Status: AC
  Administered 2012-06-05: 2 mg via INTRAVENOUS
  Filled 2012-06-05: qty 2

## 2012-06-05 MED ORDER — ACETAMINOPHEN 325 MG PO TABS
650.0000 mg | ORAL_TABLET | Freq: Once | ORAL | Status: AC
  Administered 2012-06-05: 650 mg via ORAL

## 2012-06-05 MED ORDER — SODIUM CHLORIDE 0.9 % IJ SOLN
10.0000 mL | INTRAMUSCULAR | Status: DC | PRN
  Administered 2012-06-05: 10 mL
  Filled 2012-06-05: qty 10

## 2012-06-05 MED ORDER — HEPARIN SOD (PORK) LOCK FLUSH 100 UNIT/ML IV SOLN
500.0000 [IU] | Freq: Once | INTRAVENOUS | Status: AC | PRN
  Administered 2012-06-05: 500 [IU]
  Filled 2012-06-05: qty 5

## 2012-06-05 MED ORDER — DEXAMETHASONE SODIUM PHOSPHATE 4 MG/ML IJ SOLN
20.0000 mg | Freq: Once | INTRAMUSCULAR | Status: AC
  Administered 2012-06-05: 20 mg via INTRAVENOUS

## 2012-06-05 MED ORDER — ONDANSETRON 16 MG/50ML IVPB (CHCC)
16.0000 mg | Freq: Once | INTRAVENOUS | Status: AC
  Administered 2012-06-05: 16 mg via INTRAVENOUS

## 2012-06-05 MED ORDER — SODIUM CHLORIDE 0.9 % IV SOLN
450.0000 mg/m2 | Freq: Once | INTRAVENOUS | Status: AC
  Administered 2012-06-05: 600 mg via INTRAVENOUS
  Filled 2012-06-05: qty 30

## 2012-06-05 MED ORDER — SODIUM CHLORIDE 0.9 % IV SOLN
Freq: Once | INTRAVENOUS | Status: AC
  Administered 2012-06-05: 09:00:00 via INTRAVENOUS

## 2012-06-05 MED ORDER — SODIUM CHLORIDE 0.9 % IV SOLN
375.0000 mg/m2 | Freq: Once | INTRAVENOUS | Status: AC
  Administered 2012-06-05: 500 mg via INTRAVENOUS
  Filled 2012-06-05: qty 50

## 2012-06-05 NOTE — Patient Instructions (Addendum)
   [facility] Discharge Instructions for Patients Receiving Chemotherapy  Today you received the following chemotherapy agents Rchop  To help prevent nausea and vomiting after your treatment, we encourage you to take your nausea medication as directed   If you develop nausea and vomiting that is not controlled by your nausea medication, call the clinic. If it is after clinic hours your family physician or the after hours number for the clinic or go to the Emergency Department.   BELOW ARE SYMPTOMS THAT SHOULD BE REPORTED IMMEDIATELY:  *FEVER GREATER THAN 100.5 F  *CHILLS WITH OR WITHOUT FEVER  NAUSEA AND VOMITING THAT IS NOT CONTROLLED WITH YOUR NAUSEA MEDICATION  *UNUSUAL SHORTNESS OF BREATH  *UNUSUAL BRUISING OR BLEEDING  TENDERNESS IN MOUTH AND THROAT WITH OR WITHOUT PRESENCE OF ULCERS  *URINARY PROBLEMS  *BOWEL PROBLEMS  UNUSUAL RASH Items with * indicate a potential emergency and should be followed up as soon as possible.  One of the nurses will contact you 24 hours after your treatment. Please let the nurse know about any problems that you may have experienced. Feel free to call the clinic you have any questions or concerns. The clinic phone number is [phone].   I have been informed and understand all the instructions given to me. I know to contact the clinic, my physician, or go to the Emergency Department if any problems should occur. I do not have any questions at this time, but understand that I may call the clinic during office hours   should I have any questions or need assistance in obtaining follow up care.    __________________________________________  _____________  __________ Signature of Patient or Authorized Representative            Date                   Time    __________________________________________ Nurse's Signature

## 2012-06-06 ENCOUNTER — Ambulatory Visit (HOSPITAL_BASED_OUTPATIENT_CLINIC_OR_DEPARTMENT_OTHER): Payer: Medicare HMO

## 2012-06-06 VITALS — BP 125/59 | HR 98 | Temp 97.2°F

## 2012-06-06 DIAGNOSIS — C8330 Diffuse large B-cell lymphoma, unspecified site: Secondary | ICD-10-CM

## 2012-06-06 DIAGNOSIS — C8589 Other specified types of non-Hodgkin lymphoma, extranodal and solid organ sites: Secondary | ICD-10-CM

## 2012-06-06 MED ORDER — PEGFILGRASTIM INJECTION 6 MG/0.6ML
6.0000 mg | Freq: Once | SUBCUTANEOUS | Status: AC
  Administered 2012-06-06: 6 mg via SUBCUTANEOUS
  Filled 2012-06-06: qty 0.6

## 2012-06-08 ENCOUNTER — Ambulatory Visit (INDEPENDENT_AMBULATORY_CARE_PROVIDER_SITE_OTHER): Payer: Medicare HMO | Admitting: Family Medicine

## 2012-06-08 ENCOUNTER — Encounter: Payer: Self-pay | Admitting: Family Medicine

## 2012-06-08 VITALS — BP 130/64 | Temp 97.4°F | Wt 102.0 lb

## 2012-06-08 DIAGNOSIS — R609 Edema, unspecified: Secondary | ICD-10-CM

## 2012-06-08 DIAGNOSIS — M549 Dorsalgia, unspecified: Secondary | ICD-10-CM

## 2012-06-08 DIAGNOSIS — I10 Essential (primary) hypertension: Secondary | ICD-10-CM

## 2012-06-08 DIAGNOSIS — M810 Age-related osteoporosis without current pathological fracture: Secondary | ICD-10-CM

## 2012-06-08 MED ORDER — FUROSEMIDE 20 MG PO TABS: 20.0000 mg | ORAL_TABLET | Freq: Every day | ORAL | Status: DC

## 2012-06-08 NOTE — Progress Notes (Signed)
  Subjective:    Patient ID: Colleen Cooke, female    DOB: February 03, 1935, 77 y.o.   MRN: [id number]  HPI  Lymphoma history. Patient transitioned from follicular lymphoma to diffuse large B-cell lymphoma. Just finished third cycle of chemotherapy and tolerating very well. Weight increased 6 pounds from 06/02/2012. She's had some edema in her legs but she's had previously. No dyspnea. No orthopnea. No history of CHF. Poor dietary compliance and apparently consuming a fair amount of sodium. Remains on Remeron which has helped her appetite.  History of osteoporosis. Compression fractures. On morphine sulfate 15 mg once daily. Current pain 5/10 at worst. Tends to be worse at night. Ambulating without difficulty. Also remains on calcium, vitamin D, and Fosamax.   Review of Systems  Constitutional: Negative for appetite change and fatigue.  Respiratory: Negative for cough and shortness of breath.   Cardiovascular: Positive for leg swelling. Negative for chest pain.  Gastrointestinal: Negative for abdominal pain.  Genitourinary: Negative for dysuria.  Neurological: Negative for dizziness and syncope.       Objective:   Physical Exam  Constitutional: She appears well-developed and well-nourished.  Neck: Neck supple.  Cardiovascular: Normal rate and regular rhythm.   Pulmonary/Chest: No respiratory distress. She has no wheezes. She has no rales.  Musculoskeletal: She exhibits edema.       1+ pitting edema legs bilaterally up to knees          Assessment & Plan:  #1 bilateral leg edema. Likely multifactorial. She has history of slightly low albumin and probably some venous stasis. Elevate legs frequently. She has not been compliant with compression garments previously. Short-term use only Lasix 20 mg once daily for 3 days. Weigh daily. Be in touch if greater than 3 pounds weight gain in two days or 5 pounds one week. Reduce sodium intake #2 osteoporosis. Chronic back pain. Continue morphine  sulfate. Continue Fosamax and calcium and vitamin D.

## 2012-06-08 NOTE — Patient Instructions (Signed)
Start back furosemide 20 mg one daily for 3 days Watch salt intake. Elevate legs frequently Weight daily.  Be in touch if your weight goes up more than 3 pounds in one day or 5 pounds in one week.

## 2012-06-23 ENCOUNTER — Other Ambulatory Visit: Payer: Self-pay | Admitting: *Deleted

## 2012-06-23 ENCOUNTER — Other Ambulatory Visit (HOSPITAL_BASED_OUTPATIENT_CLINIC_OR_DEPARTMENT_OTHER): Payer: Medicare HMO

## 2012-06-23 ENCOUNTER — Ambulatory Visit (HOSPITAL_BASED_OUTPATIENT_CLINIC_OR_DEPARTMENT_OTHER): Payer: Medicare HMO | Admitting: Oncology

## 2012-06-23 ENCOUNTER — Telehealth: Payer: Self-pay | Admitting: Oncology

## 2012-06-23 VITALS — BP 131/67 | HR 100 | Temp 97.3°F | Resp 20 | Ht 65.0 in | Wt 96.5 lb

## 2012-06-23 DIAGNOSIS — M1990 Unspecified osteoarthritis, unspecified site: Secondary | ICD-10-CM

## 2012-06-23 DIAGNOSIS — R634 Abnormal weight loss: Secondary | ICD-10-CM

## 2012-06-23 DIAGNOSIS — C8330 Diffuse large B-cell lymphoma, unspecified site: Secondary | ICD-10-CM

## 2012-06-23 DIAGNOSIS — D649 Anemia, unspecified: Secondary | ICD-10-CM

## 2012-06-23 DIAGNOSIS — C8589 Other specified types of non-Hodgkin lymphoma, extranodal and solid organ sites: Secondary | ICD-10-CM

## 2012-06-23 LAB — CBC WITH DIFFERENTIAL/PLATELET
Eosinophils Absolute: 0 10*3/uL (ref 0.0–0.5)
HCT: 29.4 % — ABNORMAL LOW (ref 34.8–46.6)
LYMPH%: 4.5 % — ABNORMAL LOW (ref 14.0–49.7)
MCHC: 34.3 g/dL (ref 31.5–36.0)
MCV: 104.4 fL — ABNORMAL HIGH (ref 79.5–101.0)
MONO#: 0.7 10*3/uL (ref 0.1–0.9)
MONO%: 4.5 % (ref 0.0–14.0)
NEUT%: 90.4 % — ABNORMAL HIGH (ref 38.4–76.8)
Platelets: 279 10*3/uL (ref 145–400)
RBC: 2.81 10*6/uL — ABNORMAL LOW (ref 3.70–5.45)
WBC: 14.5 10*3/uL — ABNORMAL HIGH (ref 3.9–10.3)

## 2012-06-23 LAB — COMPREHENSIVE METABOLIC PANEL (CC13)
Alkaline Phosphatase: 192 U/L — ABNORMAL HIGH (ref 40–150)
CO2: 27 mEq/L (ref 22–29)
Creatinine: 0.7 mg/dL (ref 0.6–1.1)
Glucose: 91 mg/dl (ref 70–99)
Total Bilirubin: 0.36 mg/dL (ref 0.20–1.20)

## 2012-06-23 NOTE — Progress Notes (Signed)
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   PAST THERAPY: For history of stage IV, low grade but high risk follicular lymphoma: 3 cycles of Bendamustine/Rituxan between Jan 2013 and March 2013. She stopped chemo due to severe pain from compression fracture and severe bone pain.   CURRENT THERAPY: started mini-RCHOP 04/24/2012; goal for 6 cycles total.   INTERVAL HISTORY: Colleen Cooke 77 y.o. female returns for regular follow up for cycle #4 of chemo dose-reduced R-CHOP. She has mild fatigue, however it is not as severe as when she was on bendamustine chemotherapy, and she is still independent of activities of daily living. She still has chronic low back pain, osteoarthritis and compression fracture. She takes chronic pain medication. She is not able to ambulate without the use of a walker or cane. She denies residual palpable lymph node swelling, fever, mucositis, nausea vomiting, neuropathy, abdominal pain, bowel swelling, skin rash, low extremity weakness, bowel bladder incontinence. The rest of the 14 point review of system was negative.  Past Medical History  Diagnosis Date  . Chicken pox   . 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 Non-Hodgkin's lymphoma, Burkitt's     Jan. 2013  . Back pain     pt has compression fractures in back  . Wears glasses   . Wears dentures     upper and lower  . Diffuse large B cell lymphoma 04/13/2012    Past Surgical History  Procedure Date  . Appendectomy     age 71  . Tonsillectomy     age 69  . Mass biopsy 04/04/2012    Procedure: NECK MASS BIOPSY;  Surgeon: Drema Halon, MD;  Location: [facility];  Service: ENT;  Laterality: Right;    Current Outpatient Prescriptions  Medication Sig Dispense Refill  . acetaminophen (TYLENOL) 500 MG  tablet Take 1,000 mg by mouth every morning.      Marland Kitchen alendronate (FOSAMAX) 70 MG tablet Take 70 mg by mouth every Sunday. Take with a full glass of water on an empty stomach.      . Calcium Carbonate (CALCIUM 600 PO) Take 630 mg by mouth daily.      . cholecalciferol (VITAMIN D) 1000 UNITS tablet Take 1,000 Units by mouth daily.      . Diphenhyd-Hydrocort-Nystatin SUSP Swish and spit 15 mLs 4 (four) times daily as needed (Swish/gargle and spit as needed for mouth soreness).  500 mL  2  . furosemide (LASIX) 20 MG tablet Take 1 tablet (20 mg total) by mouth daily.  30 tablet  3  . gabapentin (NEURONTIN) 100 MG capsule Take 1 capsule (100 mg total) by mouth 3 (three) times daily.  90 capsule  3  . lidocaine-prilocaine (EMLA) cream Apply topically as needed. Put it on portacath one hour before access.  30 g  2  . mirtazapine (REMERON) 7.5 MG tablet Take 7.5 mg by mouth at bedtime.      Marland Kitchen morphine (MSIR) 15 MG tablet Take 1 tablet (15 mg total) by mouth every 6 (six) hours as needed for pain.  90 tablet  0  . ondansetron (ZOFRAN) 8 MG tablet Take 1 tablet (8 mg total) by mouth 2 (two) times daily. Take two times a day starting the day after chemo for 3 days. Then take two times a day as needed for  nausea or vomiting.  30 tablet  1  . OVER THE COUNTER MEDICATION Take 1,000 mg by mouth daily. Emergency vitamin c powder      . polyethylene glycol (MIRALAX / GLYCOLAX) packet Take 17 g by mouth every morning.      . predniSONE (DELTASONE) 20 MG tablet Take 2 tablets (40 mg total) by mouth daily. Take on days 1-5 of chemotherapy.  10 tablet  5  . prochlorperazine (COMPAZINE) 10 MG tablet Take 1 tablet (10 mg total) by mouth every 6 (six) hours as needed (Nausea or vomiting).  30 tablet  6    ALLERGIES:  is allergic to penicillins.  REVIEW OF SYSTEMS:  The rest of the 14-point review of system was negative.   Filed Vitals:   06/23/12 0909  BP: 131/67  Pulse: 100  Temp: 97.3 F (36.3 C)  Resp: 20   Wt  Readings from Last 3 Encounters:  06/23/12 96 lb 8 oz (43.772 kg)  06/08/12 102 lb (46.267 kg)  06/02/12 96 lb 9.6 oz (43.817 kg)   ECOG Performance status: 1  PHYSICAL EXAMINATION:   General: Thin-appearing woman, in no acute distress. Eyes: no scleral icterus. ENT: There were no oropharyngeal lesions. Neck was without thyromegaly. Lymphatics: Positive for left level II neck node about 2 cm; hard, tender to palpation.  The right cervical level II node was no longer palpable.  Negative for supraclavicular, inguinal, axillary adenopathy. Respiratory: lungs were clear bilaterally without wheezing or crackles. Cardiovascular: Regular rate and rhythm, S1/S2, without murmur, rub or gallop. There was no pedal edema. GI: abdomen was soft, flat, nontender, nondistended, without organomegaly. Muscoloskeletal: no spinal tenderness of palpation of vertebral spine. Skin exam was without echymosis, petichae. Neuro exam was nonfocal. Patient was able to get on and off exam table without assistance. Gait was measured.  She needed a cane to assister her ambulation. Patient was alerted and oriented. Attention was good. Language was appropriate. Mood was normal without depression. Speech was not pressured. Thought content was not tangential.    LABORATORY/RADIOLOGY DATA:  Lab Results  Component Value Date   WBC 14.5* 06/23/2012   HGB 10.1* 06/23/2012   HCT 29.4* 06/23/2012   PLT 279 06/23/2012   GLUCOSE 91 06/23/2012   ALKPHOS 192* 06/23/2012   ALT 18 06/23/2012   AST 21 06/23/2012   NA 140 06/23/2012   K 3.8 06/23/2012   CL 103 06/23/2012   CREATININE 0.7 06/23/2012   BUN 12.0 06/23/2012   CO2 27 06/23/2012   INR 0.88 04/18/2012     ASSESSMENT AND PLAN:  1. Diagnosis: Transformation of follicular lymphoma now to diffuse large B-cell lymphoma - She is s/p 3 cycles of dose reduced R-CHOP.   For cycle #3 the dose of Cytoxan and Adriamycin were increased to 60% from 50% of baseline. She has grade 1  anemia, grade 1 mucositis, grade 1 anorexia, grade 1 weight loss, and grade 2 fatigue. There is no indication for dose reduction. She does not want to have dose increase due to concern that she would have further anorexia and weight loss. I agree with her wish. - I recommended to proceed with cycle #4 of chemo the same dose with cycle 3.   2. Osteoarhritis: Due to compression fractures. She is on MSIR per Dr Caryl Never. She is also on Fosamax, calcium, and vitamin D.   3. Grade 1 anemia: most likely anemia of chemo.  There is no active bleeding. There is no indication for blood transfusion.  4.  Grade 1 anorexia and weight loss:  Due to chemotherapy. She is on oral boost supplements.  Appetite is better and slowly gaining weight on Remeron.   5.  Grade 1 mucositis, due to chemotherapy. She has Magic mouthwash as needed.  6. Follow up in 3 weeks before the 5th cycle of chemo.

## 2012-06-23 NOTE — Telephone Encounter (Signed)
THE 7.5MG  STRENGTH IS NOT AVAILABLE. PHARMACY WOULD LIKE TO USE 1/2 TAB OF MIRTAZAPINE 15MG . VERBAL ORDER AND READ BACK TO DR.HA- MAY USE 1/2 TAB OF MIRTAZAPINE 15MG  FOR PT.'S 7.5MG  DOSE OF MIRTAZAPINE. NOTIFIED PHARMACY VIA FAXED.

## 2012-06-23 NOTE — Telephone Encounter (Signed)
Gave pt appt calendar for December 2013 chemo, on January , lab,ML and chemo and on February 2014 lab, MD and chemo

## 2012-06-26 ENCOUNTER — Ambulatory Visit (HOSPITAL_BASED_OUTPATIENT_CLINIC_OR_DEPARTMENT_OTHER): Payer: Medicare HMO

## 2012-06-26 ENCOUNTER — Other Ambulatory Visit: Payer: Self-pay | Admitting: Oncology

## 2012-06-26 VITALS — BP 102/52 | HR 96 | Temp 98.2°F | Resp 20

## 2012-06-26 DIAGNOSIS — C8330 Diffuse large B-cell lymphoma, unspecified site: Secondary | ICD-10-CM

## 2012-06-26 DIAGNOSIS — C8589 Other specified types of non-Hodgkin lymphoma, extranodal and solid organ sites: Secondary | ICD-10-CM

## 2012-06-26 DIAGNOSIS — Z5112 Encounter for antineoplastic immunotherapy: Secondary | ICD-10-CM

## 2012-06-26 MED ORDER — DIPHENHYDRAMINE HCL 25 MG PO CAPS
50.0000 mg | ORAL_CAPSULE | Freq: Once | ORAL | Status: AC
  Administered 2012-06-26: 50 mg via ORAL

## 2012-06-26 MED ORDER — SODIUM CHLORIDE 0.9 % IV SOLN
Freq: Once | INTRAVENOUS | Status: AC
  Administered 2012-06-26: 10:00:00 via INTRAVENOUS

## 2012-06-26 MED ORDER — DOXORUBICIN HCL CHEMO IV INJECTION 2 MG/ML
30.0000 mg/m2 | Freq: Once | INTRAVENOUS | Status: AC
  Administered 2012-06-26: 40 mg via INTRAVENOUS
  Filled 2012-06-26: qty 20

## 2012-06-26 MED ORDER — HEPARIN SOD (PORK) LOCK FLUSH 100 UNIT/ML IV SOLN
500.0000 [IU] | Freq: Once | INTRAVENOUS | Status: AC | PRN
  Administered 2012-06-26: 500 [IU]
  Filled 2012-06-26: qty 5

## 2012-06-26 MED ORDER — SODIUM CHLORIDE 0.9 % IV SOLN
450.0000 mg/m2 | Freq: Once | INTRAVENOUS | Status: AC
  Administered 2012-06-26: 600 mg via INTRAVENOUS
  Filled 2012-06-26: qty 30

## 2012-06-26 MED ORDER — DEXAMETHASONE SODIUM PHOSPHATE 4 MG/ML IJ SOLN
20.0000 mg | Freq: Once | INTRAMUSCULAR | Status: AC
  Administered 2012-06-26: 20 mg via INTRAVENOUS

## 2012-06-26 MED ORDER — SODIUM CHLORIDE 0.9 % IV SOLN
375.0000 mg/m2 | Freq: Once | INTRAVENOUS | Status: AC
  Administered 2012-06-26: 500 mg via INTRAVENOUS
  Filled 2012-06-26: qty 50

## 2012-06-26 MED ORDER — VINCRISTINE SULFATE CHEMO INJECTION 1 MG/ML
2.0000 mg | Freq: Once | INTRAVENOUS | Status: AC
  Administered 2012-06-26: 2 mg via INTRAVENOUS
  Filled 2012-06-26: qty 2

## 2012-06-26 MED ORDER — ONDANSETRON 16 MG/50ML IVPB (CHCC)
16.0000 mg | Freq: Once | INTRAVENOUS | Status: AC
  Administered 2012-06-26: 16 mg via INTRAVENOUS

## 2012-06-26 MED ORDER — SODIUM CHLORIDE 0.9 % IJ SOLN
10.0000 mL | INTRAMUSCULAR | Status: DC | PRN
  Administered 2012-06-26: 10 mL
  Filled 2012-06-26: qty 10

## 2012-06-26 MED ORDER — ACETAMINOPHEN 325 MG PO TABS
650.0000 mg | ORAL_TABLET | Freq: Once | ORAL | Status: AC
  Administered 2012-06-26: 650 mg via ORAL

## 2012-06-26 NOTE — Patient Instructions (Signed)
Patient aware of next appointment; discharged home with no complaints. 

## 2012-06-27 ENCOUNTER — Ambulatory Visit (HOSPITAL_BASED_OUTPATIENT_CLINIC_OR_DEPARTMENT_OTHER): Payer: Medicare HMO

## 2012-06-27 VITALS — BP 123/61 | HR 91 | Temp 96.7°F | Resp 18

## 2012-06-27 DIAGNOSIS — C8581 Other specified types of non-Hodgkin lymphoma, lymph nodes of head, face, and neck: Secondary | ICD-10-CM

## 2012-06-27 DIAGNOSIS — C8330 Diffuse large B-cell lymphoma, unspecified site: Secondary | ICD-10-CM

## 2012-06-27 MED ORDER — PEGFILGRASTIM INJECTION 6 MG/0.6ML
6.0000 mg | Freq: Once | SUBCUTANEOUS | Status: AC
  Administered 2012-06-27: 6 mg via SUBCUTANEOUS
  Filled 2012-06-27: qty 0.6

## 2012-07-03 ENCOUNTER — Telehealth: Payer: Self-pay | Admitting: Family Medicine

## 2012-07-03 ENCOUNTER — Inpatient Hospital Stay (HOSPITAL_COMMUNITY)
Admission: EM | Admit: 2012-07-03 | Discharge: 2012-07-06 | DRG: 602 | Disposition: A | Discharge status: Home / Self Care | Payer: Medicare HMO | Attending: Internal Medicine | Admitting: Internal Medicine

## 2012-07-03 ENCOUNTER — Encounter (HOSPITAL_COMMUNITY): Payer: Self-pay | Admitting: *Deleted

## 2012-07-03 DIAGNOSIS — Z880 Allergy status to penicillin: Secondary | ICD-10-CM

## 2012-07-03 DIAGNOSIS — C8589 Other specified types of non-Hodgkin lymphoma, extranodal and solid organ sites: Secondary | ICD-10-CM | POA: Diagnosis present

## 2012-07-03 DIAGNOSIS — D696 Thrombocytopenia, unspecified: Secondary | ICD-10-CM | POA: Diagnosis present

## 2012-07-03 DIAGNOSIS — I10 Essential (primary) hypertension: Secondary | ICD-10-CM | POA: Diagnosis present

## 2012-07-03 DIAGNOSIS — L02419 Cutaneous abscess of limb, unspecified: Principal | ICD-10-CM | POA: Diagnosis present

## 2012-07-03 DIAGNOSIS — Z9089 Acquired absence of other organs: Secondary | ICD-10-CM

## 2012-07-03 DIAGNOSIS — R609 Edema, unspecified: Secondary | ICD-10-CM | POA: Diagnosis present

## 2012-07-03 DIAGNOSIS — D6481 Anemia due to antineoplastic chemotherapy: Secondary | ICD-10-CM | POA: Diagnosis present

## 2012-07-03 DIAGNOSIS — R636 Underweight: Secondary | ICD-10-CM

## 2012-07-03 DIAGNOSIS — D649 Anemia, unspecified: Secondary | ICD-10-CM | POA: Diagnosis present

## 2012-07-03 DIAGNOSIS — C8590 Non-Hodgkin lymphoma, unspecified, unspecified site: Secondary | ICD-10-CM

## 2012-07-03 DIAGNOSIS — Z8249 Family history of ischemic heart disease and other diseases of the circulatory system: Secondary | ICD-10-CM

## 2012-07-03 DIAGNOSIS — D6959 Other secondary thrombocytopenia: Secondary | ICD-10-CM | POA: Diagnosis present

## 2012-07-03 DIAGNOSIS — M810 Age-related osteoporosis without current pathological fracture: Secondary | ICD-10-CM

## 2012-07-03 DIAGNOSIS — J90 Pleural effusion, not elsewhere classified: Secondary | ICD-10-CM

## 2012-07-03 DIAGNOSIS — Y92009 Unspecified place in unspecified non-institutional (private) residence as the place of occurrence of the external cause: Secondary | ICD-10-CM

## 2012-07-03 DIAGNOSIS — L0390 Cellulitis, unspecified: Secondary | ICD-10-CM

## 2012-07-03 DIAGNOSIS — Z681 Body mass index (BMI) 19 or less, adult: Secondary | ICD-10-CM

## 2012-07-03 DIAGNOSIS — M79609 Pain in unspecified limb: Secondary | ICD-10-CM

## 2012-07-03 DIAGNOSIS — L0291 Cutaneous abscess, unspecified: Secondary | ICD-10-CM

## 2012-07-03 DIAGNOSIS — L03119 Cellulitis of unspecified part of limb: Secondary | ICD-10-CM | POA: Diagnosis present

## 2012-07-03 DIAGNOSIS — M549 Dorsalgia, unspecified: Secondary | ICD-10-CM

## 2012-07-03 DIAGNOSIS — E43 Unspecified severe protein-calorie malnutrition: Secondary | ICD-10-CM | POA: Diagnosis present

## 2012-07-03 DIAGNOSIS — E876 Hypokalemia: Secondary | ICD-10-CM | POA: Diagnosis present

## 2012-07-03 DIAGNOSIS — Z9221 Personal history of antineoplastic chemotherapy: Secondary | ICD-10-CM

## 2012-07-03 DIAGNOSIS — C8330 Diffuse large B-cell lymphoma, unspecified site: Secondary | ICD-10-CM | POA: Diagnosis present

## 2012-07-03 DIAGNOSIS — E871 Hypo-osmolality and hyponatremia: Secondary | ICD-10-CM

## 2012-07-03 DIAGNOSIS — M4854XA Collapsed vertebra, not elsewhere classified, thoracic region, initial encounter for fracture: Secondary | ICD-10-CM

## 2012-07-03 DIAGNOSIS — Z87891 Personal history of nicotine dependence: Secondary | ICD-10-CM

## 2012-07-03 DIAGNOSIS — Z79899 Other long term (current) drug therapy: Secondary | ICD-10-CM

## 2012-07-03 DIAGNOSIS — T451X5A Adverse effect of antineoplastic and immunosuppressive drugs, initial encounter: Secondary | ICD-10-CM | POA: Diagnosis present

## 2012-07-03 DIAGNOSIS — S32000A Wedge compression fracture of unspecified lumbar vertebra, initial encounter for closed fracture: Secondary | ICD-10-CM

## 2012-07-03 LAB — DIFFERENTIAL
Basophils Relative: 0 % (ref 0–1)
Eosinophils Relative: 2 % (ref 0–5)
Lymphocytes Relative: 6 % — ABNORMAL LOW (ref 12–46)
Monocytes Relative: 29 % — ABNORMAL HIGH (ref 3–12)
Neutro Abs: 2.9 10*3/uL (ref 1.7–7.7)

## 2012-07-03 LAB — BASIC METABOLIC PANEL
BUN: 20 mg/dL (ref 6–23)
Chloride: 88 mEq/L — ABNORMAL LOW (ref 96–112)
GFR calc Af Amer: 75 mL/min — ABNORMAL LOW (ref >90)
Potassium: 3 mEq/L — ABNORMAL LOW (ref 3.5–5.1)

## 2012-07-03 LAB — TYPE AND SCREEN: Antibody Screen: NEGATIVE

## 2012-07-03 LAB — ABO/RH: ABO/RH(D): O NEG

## 2012-07-03 MED ORDER — CLINDAMYCIN PHOSPHATE 600 MG/50ML IV SOLN
600.0000 mg | Freq: Once | INTRAVENOUS | Status: AC
  Administered 2012-07-03: 600 mg via INTRAVENOUS
  Filled 2012-07-03: qty 50

## 2012-07-03 MED ORDER — POTASSIUM CHLORIDE 10 MEQ/100ML IV SOLN
10.0000 meq | Freq: Once | INTRAVENOUS | Status: AC
  Administered 2012-07-03: 10 meq via INTRAVENOUS
  Filled 2012-07-03: qty 100

## 2012-07-03 MED ORDER — POTASSIUM CHLORIDE CRYS ER 20 MEQ PO TBCR
40.0000 meq | EXTENDED_RELEASE_TABLET | Freq: Once | ORAL | Status: AC
  Administered 2012-07-03: 40 meq via ORAL
  Filled 2012-07-03: qty 2

## 2012-07-03 NOTE — ED Notes (Signed)
States that she took Lasix earlier today which has helped the swelling.

## 2012-07-03 NOTE — Telephone Encounter (Signed)
Patient Information:  Caller Name: Teriana  Phone: [phone]  Patient: Colleen Cooke, Colleen Cooke  Gender: Female  DOB: Aug 08, 1934  Age: 78 Years  PCP: Evelena Peat [facility])  Office Follow Up:  Does the office need to follow up with this patient?: No  Instructions For The Office: N/A  RN Note:  Used lasix 07/02/12 and 07/04/11.  Right calf is worse but less swelling since Lasix dose this morning. Skin is painful to touch with red spots on right inner calf.  Symptoms  Reason For Call & Symptoms: bilateral cellulitis with swelling, pain if touched  and red spots on one calf.  Reviewed Health History In EMR: Yes  Reviewed Medications In EMR: Yes  Reviewed Allergies In EMR: Yes  Reviewed Surgeries / Procedures: Yes  Date of Onset of Symptoms: 07/02/2012  Treatments Tried: Lasix  Treatments Tried Worked: Yes  Guideline(s) Used:  Leg Swelling and Edema  Disposition Per Guideline:   Go to ED Now (or to Office with PCP Approval)  Reason For Disposition Reached:   Thigh, calf, or ankle swelling in both legs, but one side is definitely more swollen  Advice Given:  Varicose Veins - Treatment:  Try to rest and elevate your legs above your heart a couple times each day for 15 minutes.  Walking is good for your blood flow (Reason: helps pump the blood out of the veins).  Support hose may be helpful. Put them on first thing in the morning when the swelling is least.  Call Back If:  Swelling becomes worse  Swelling becomes red or painful to the touch  Calf pain occurs and becomes constant  RN Overrode Recommendation:  Go To ED  ED disposition; agreed to go to ED for evaluation now

## 2012-07-03 NOTE — ED Notes (Signed)
Pt c/o right leg swelling and redness, reports PCP is treating her for cellulitis to that extremity. Reports swelling started last night.

## 2012-07-03 NOTE — ED Notes (Signed)
IV team paged for port-a-cath access.

## 2012-07-03 NOTE — ED Provider Notes (Signed)
Pt received from [place], PA-C.  Pt on chemotherapy for lymphoma, presented to ED w/ RLE cellulitis, labs sig for acute anemia; hemoccult neg, mild hyponatremia and hypokalemia.  She has received IV Vanc and Clinda as well as 10IV and 40po potassium.  Declines pain medication at this time. [facility] consulted for admission.    Otilio Miu, PA-C 07/04/12 [phone]

## 2012-07-03 NOTE — ED Provider Notes (Signed)
History     CSN: [id number]  Arrival date & time 07/03/12  1624   First MD Initiated Contact with Patient 07/03/12 1842      Chief Complaint  Patient presents with  . Leg Swelling  . Cellulitis    (Consider location/radiation/quality/duration/timing/severity/associated sxs/prior treatment) HPI Comments: Patient with a history of Cellulitis and Non-Hodgkin's Lymphoma presents today with a chief complaint of erythema, warmth, and tenderness of the right lower extremity.  She reports that her symptoms have been present since yesterday and are gradually worsening.  She reports that she took a Lasix last evening for the swelling and elevated her leg, which has helped with the swelling somewhat.  The erythema and warmth has not improved.  She also has bilaterally swelling of both of the lower extremities.  She denies fever, but her temperature was 100.3 orally at triage.  She denies nausea or vomiting.  She is currently undergoing chemotherapy for her Lymphoma.    The history is provided by the patient.    Past Medical History  Diagnosis Date  . Chicken pox   . 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 Non-Hodgkin's lymphoma, Burkitt's     Jan. 2013  . Back pain     pt has compression fractures in back  . Wears glasses   . Wears dentures     upper and lower  . Diffuse large B cell lymphoma 04/13/2012    Past Surgical History  Procedure Date  . Appendectomy     age 15  . Tonsillectomy     age 74  . Mass biopsy 04/04/2012    Procedure: NECK MASS BIOPSY;  Surgeon: Drema Halon, MD;  Location: [facility];  Service: ENT;  Laterality: Right;    Family History  Problem Relation Age of Onset  . Coronary artery disease Mother 75    History  Substance Use Topics  . Smoking status: Former Smoker -- 1.0 packs/day for 20 years    Types: Cigarettes    Quit date: 01/07/1994  . Smokeless tobacco: Never Used  . Alcohol Use: 0.6 oz/week    1 Glasses of wine per week   Comment: Drinks 1 glass of wine daily    OB History    Grav Para Term Preterm Abortions TAB SAB Ect Mult Living                  Review of Systems  Constitutional: Positive for fever.  Gastrointestinal: Negative for nausea and vomiting.  Musculoskeletal: Negative for gait problem.  Skin: Positive for color change.  All other systems reviewed and are negative.    Allergies  Penicillins  Home Medications   Current Outpatient Rx  Name  Route  Sig  Dispense  Refill  . ACETAMINOPHEN 500 MG PO TABS   Oral   Take 1,000 mg by mouth every morning.         . ALENDRONATE SODIUM 70 MG PO TABS   Oral   Take 70 mg by mouth every Sunday. Take with a full glass of water on an empty stomach.         Marland Kitchen CALCIUM 600 PO   Oral   Take 630 mg by mouth daily.         Marland Kitchen VITAMIN D 1000 UNITS PO TABS   Oral   Take 1,000 Units by mouth daily.         . FUROSEMIDE 20 MG PO TABS   Oral   Take 1 tablet (20  mg total) by mouth daily.   30 tablet   3   . GABAPENTIN 100 MG PO CAPS   Oral   Take 1 capsule (100 mg total) by mouth 3 (three) times daily.   90 capsule   3   . LIDOCAINE-PRILOCAINE 2.5-2.5 % EX CREA   Topical   Apply topically as needed. Put it on portacath one hour before access.   30 g   2   . MIRTAZAPINE 7.5 MG PO TABS   Oral   Take 7.5 mg by mouth at bedtime.         . MORPHINE SULFATE 15 MG PO TABS   Oral   Take 15 mg by mouth every morning.         Marland Kitchen ONDANSETRON HCL 8 MG PO TABS   Oral   Take 1 tablet (8 mg total) by mouth 2 (two) times daily. Take two times a day starting the day after chemo for 3 days. Then take two times a day as needed for nausea or vomiting.   30 tablet   1   . OVER THE COUNTER MEDICATION   Oral   Take 1,000 mg by mouth daily. Emergency vitamin c powder         . PEGFILGRASTIM INJECTION 6 MG/0.6ML   Subcutaneous   Inject 6 mg into the skin once.         Marland Kitchen POLYETHYLENE GLYCOL 3350 PO PACK   Oral   Take 17 g by mouth  every morning.         Marland Kitchen PREDNISONE 20 MG PO TABS   Oral   Take 2 tablets (40 mg total) by mouth daily. Take on days 1-5 of chemotherapy.   10 tablet   5   . PRESCRIPTION MEDICATION   Intravenous   Inject 2 mg into the vein once. vinCRIStine (ONCOVIN) 2 mg in sodium chloride 0.9 % 50 mL chemo infusion  2 mg  Once  06/26/2012  06/26/2012      Route: Intravenous         . PRESCRIPTION MEDICATION   Intravenous   Inject 40 mg into the vein once. DOXOrubicin (ADRIAMYCIN) chemo injection 40 mg 30 mg/m2  1.32 m2 (Treatment Plan Actual)         . PRESCRIPTION MEDICATION   Intravenous   Inject 600 mg into the vein once. cyclophosphamide (CYTOXAN) 600 mg in sodium chloride 0.9 % 250 mL chemo infusion 450 mg/m2  1.32 m2 (Treatment Plan Actual)         . PRESCRIPTION MEDICATION   Intravenous   Inject 500 mg into the vein once. riTUXimab (RITUXAN) 500 mg in sodium chloride 0.9 % 250 mL chemo infusion 375 mg/m2  1.32 m2 (Treatment Plan Actual)  Once 06/26/2012    Route:  Intravenous         . DIPHENHYD-HYDROCORT-NYSTATIN MT SUSP   Swish & Spit   Swish and spit 15 mLs 4 (four) times daily as needed (Swish/gargle and spit as needed for mouth soreness).   500 mL   2   . PROCHLORPERAZINE MALEATE 10 MG PO TABS   Oral   Take 1 tablet (10 mg total) by mouth every 6 (six) hours as needed (Nausea or vomiting).   30 tablet   6     BP 101/49  Pulse 110  Temp 100.3 F (37.9 C) (Oral)  Resp 20  SpO2 97%  Physical Exam  Nursing note and vitals reviewed. Constitutional: She appears well-developed  and well-nourished. No distress.  HENT:  Head: Normocephalic and atraumatic.  Mouth/Throat: Oropharynx is clear and moist.  Cardiovascular: Normal rate, regular rhythm and normal heart sounds.   Pulses:      Dorsalis pedis pulses are 2+ on the right side, and 2+ on the left side.  Pulmonary/Chest: Effort normal and breath sounds normal.  Musculoskeletal: Normal range of motion.        1+bilaterl pitting edema of both lower extremities from the mid shin distally  Neurological: She is alert.  Skin: Skin is warm and dry. She is not diaphoretic. There is erythema.     Psychiatric: She has a normal mood and affect.    ED Course  Procedures (including critical care time)   Labs Reviewed  CBC WITH DIFFERENTIAL  BASIC METABOLIC PANEL   No results found.   No diagnosis found.  8:00 PM Patient signed out to [employer], PA-C at shift change.  Labs pending.  MDM  Patient presents with Cellulitis of her RLE.  Patient currently undergoing chemotherapy for Non-Hodgkin's Lymphoma.  Patient tachycardic with temperature of 100.3.  Therefore, feel that patient requires IV antibiotics.  Patient given dose of IV Clindamycin while in the ED.  Labs obtained.  Patient is to be admitted for Cellulitis management and IV abx once labs have resulted.        Pascal Lux [place], PA-C 07/05/12 1136

## 2012-07-03 NOTE — ED Notes (Signed)
Labs to be drawn with [facility] access

## 2012-07-04 DIAGNOSIS — D696 Thrombocytopenia, unspecified: Secondary | ICD-10-CM | POA: Diagnosis present

## 2012-07-04 DIAGNOSIS — R636 Underweight: Secondary | ICD-10-CM | POA: Diagnosis present

## 2012-07-04 DIAGNOSIS — D649 Anemia, unspecified: Secondary | ICD-10-CM | POA: Diagnosis present

## 2012-07-04 DIAGNOSIS — E43 Unspecified severe protein-calorie malnutrition: Secondary | ICD-10-CM | POA: Diagnosis present

## 2012-07-04 LAB — CBC
HCT: 24.8 % — ABNORMAL LOW (ref 36.0–46.0)
Hemoglobin: 8.6 g/dL — ABNORMAL LOW (ref 12.0–15.0)
MCHC: 34.1 g/dL (ref 30.0–36.0)
RDW: 16.5 % — ABNORMAL HIGH (ref 11.5–15.5)
WBC: 4.6 10*3/uL (ref 4.0–10.5)

## 2012-07-04 LAB — COMPREHENSIVE METABOLIC PANEL
ALT: 17 U/L (ref 0–35)
Albumin: 2.7 g/dL — ABNORMAL LOW (ref 3.5–5.2)
Alkaline Phosphatase: 102 U/L (ref 39–117)
Potassium: 3.7 mEq/L (ref 3.5–5.1)
Sodium: 132 mEq/L — ABNORMAL LOW (ref 135–145)
Total Protein: 5 g/dL — ABNORMAL LOW (ref 6.0–8.3)

## 2012-07-04 MED ORDER — ACETAMINOPHEN 500 MG PO TABS
1000.0000 mg | ORAL_TABLET | Freq: Every morning | ORAL | Status: DC
  Administered 2012-07-04 – 2012-07-06 (×3): 1000 mg via ORAL
  Filled 2012-07-04 (×3): qty 2

## 2012-07-04 MED ORDER — SODIUM CHLORIDE 0.9 % IV SOLN: INTRAVENOUS | Status: AC

## 2012-07-04 MED ORDER — GABAPENTIN 100 MG PO CAPS
100.0000 mg | ORAL_CAPSULE | Freq: Three times a day (TID) | ORAL | Status: DC
  Administered 2012-07-04 – 2012-07-06 (×8): 100 mg via ORAL
  Filled 2012-07-04 (×10): qty 1

## 2012-07-04 MED ORDER — VITAMIN D3 25 MCG (1000 UNIT) PO TABS
1000.0000 [IU] | ORAL_TABLET | Freq: Every day | ORAL | Status: DC
  Administered 2012-07-04 – 2012-07-06 (×3): 1000 [IU] via ORAL
  Filled 2012-07-04 (×3): qty 1

## 2012-07-04 MED ORDER — POLYETHYLENE GLYCOL 3350 17 G PO PACK
17.0000 g | PACK | Freq: Every morning | ORAL | Status: DC
  Administered 2012-07-04 – 2012-07-05 (×2): 17 g via ORAL
  Filled 2012-07-04 (×3): qty 1

## 2012-07-04 MED ORDER — KCL IN DEXTROSE-NACL 40-5-0.9 MEQ/L-%-% IV SOLN
INTRAVENOUS | Status: DC
  Administered 2012-07-04 – 2012-07-06 (×5): via INTRAVENOUS
  Filled 2012-07-04 (×5): qty 1000

## 2012-07-04 MED ORDER — VANCOMYCIN HCL 500 MG IV SOLR
500.0000 mg | INTRAVENOUS | Status: DC
  Administered 2012-07-04 – 2012-07-06 (×3): 500 mg via INTRAVENOUS
  Filled 2012-07-04 (×3): qty 500

## 2012-07-04 MED ORDER — ONDANSETRON HCL 4 MG/2ML IJ SOLN: 4.0000 mg | Freq: Four times a day (QID) | INTRAMUSCULAR | Status: DC | PRN

## 2012-07-04 MED ORDER — MIRTAZAPINE 7.5 MG PO TABS
7.5000 mg | ORAL_TABLET | Freq: Every day | ORAL | Status: DC
  Administered 2012-07-04 – 2012-07-05 (×2): 7.5 mg via ORAL
  Filled 2012-07-04 (×4): qty 1

## 2012-07-04 MED ORDER — CALCIUM CARBONATE 1250 (500 CA) MG PO TABS
1.0000 | ORAL_TABLET | Freq: Every day | ORAL | Status: DC
  Administered 2012-07-04 – 2012-07-06 (×3): 500 mg via ORAL
  Filled 2012-07-04 (×3): qty 1

## 2012-07-04 MED ORDER — ALENDRONATE SODIUM 70 MG PO TABS: 70.0000 mg | ORAL_TABLET | ORAL | Status: DC

## 2012-07-04 MED ORDER — HYDROMORPHONE HCL PF 1 MG/ML IJ SOLN: 1.0000 mg | INTRAMUSCULAR | Status: DC | PRN

## 2012-07-04 MED ORDER — ONDANSETRON HCL 4 MG PO TABS: 4.0000 mg | ORAL_TABLET | Freq: Four times a day (QID) | ORAL | Status: DC | PRN

## 2012-07-04 MED ORDER — BOOST PLUS PO LIQD
237.0000 mL | Freq: Three times a day (TID) | ORAL | Status: DC
  Administered 2012-07-04 – 2012-07-06 (×6): 237 mL via ORAL
  Filled 2012-07-04 (×7): qty 237

## 2012-07-04 MED ORDER — ENOXAPARIN SODIUM 30 MG/0.3ML ~~LOC~~ SOLN
30.0000 mg | SUBCUTANEOUS | Status: DC
  Filled 2012-07-04: qty 0.3

## 2012-07-04 MED ORDER — MORPHINE SULFATE 15 MG PO TABS
15.0000 mg | ORAL_TABLET | Freq: Every morning | ORAL | Status: DC
  Administered 2012-07-04 – 2012-07-06 (×3): 15 mg via ORAL
  Filled 2012-07-04 (×3): qty 1

## 2012-07-04 MED ORDER — CALCIUM CARBONATE 600 MG PO TABS: 1500.0000 mg | ORAL_TABLET | Freq: Every day | ORAL | Status: DC

## 2012-07-04 NOTE — Progress Notes (Signed)
ANTIBIOTIC CONSULT NOTE - INITIAL  Pharmacy Consult for Vancomycin Indication: Cellulitis  Allergies  Allergen Reactions  . Penicillins     REACTION: swelling localized    Patient Measurements: Height: 5' 4.96" (165 cm) Weight: 96 lb 9 oz (43.8 kg) IBW/kg (Calculated) : 56.91   Vital Signs: Temp: 100.3 F (37.9 C) (01/06 1740) Temp src: Oral (01/06 1740) BP: 101/49 mmHg (01/06 1740) Pulse Rate: 110  (01/06 1740) Intake/Output from previous day:   Intake/Output from this shift:    Labs:  Basename 07/03/12 2030 07/03/12 1956  WBC 4.6 --  HGB 8.6* --  PLT 27* --  LABCREA -- --  CREATININE -- 0.86   Estimated Creatinine Clearance: 39.1 ml/min (by C-G formula based on Cr of 0.86). No results found for this basename: VANCOTROUGH:2,VANCOPEAK:2,VANCORANDOM:2,GENTTROUGH:2,GENTPEAK:2,GENTRANDOM:2,TOBRATROUGH:2,TOBRAPEAK:2,TOBRARND:2,AMIKACINPEAK:2,AMIKACINTROU:2,AMIKACIN:2, in the last 72 hours   Microbiology: No results found for this or any previous visit (from the past 720 hour(s)).  Medical History: Past Medical History  Diagnosis Date  . Chicken pox   . 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 Non-Hodgkin's lymphoma, Burkitt's     Jan. 2013  . Back pain     pt has compression fractures in back  . Wears glasses   . Wears dentures     upper and lower  . Diffuse large B cell lymphoma 04/13/2012    Medications:  Scheduled:    . enoxaparin (LOVENOX) injection  30 mg Subcutaneous Q24H  . [COMPLETED] potassium chloride  40 mEq Oral Once   Infusions:    . [COMPLETED] clindamycin (CLEOCIN) IV Stopped (07/03/12 2135)  . [COMPLETED] potassium chloride 10 mEq (07/03/12 2238)   Assessment:  78 year old female with right leg swelling and redness  Received Clindamycin 600mg  IV x 1 in ED @ 20:30 on 1/6  IV Vancomycin per pharmacy to continue for right leg cellulitis  CrCl (CG) = 39 ml/min  Goal of Therapy:  Vancomycin trough level 10-15 mcg/ml  Plan:  Measure  antibiotic drug levels at steady state Follow up culture results Vancomycin 500mg  IV q24h  Collan Schoenfeld, Joselyn Glassman, PharmD 07/04/2012,1:12 AM

## 2012-07-04 NOTE — Progress Notes (Signed)
[facility] PROGRESS NOTE  Blanca Carreon WUJ:[id number] DOB: January 29, 1935 DOA: 07/03/2012 PCP: [name], MD  Brief narrative: Mrs. Erck is a 57 her old female with past medical history of large B cell non-Hodgkin's lymphoma, under the care of Dr. Gaylyn Rong, currently undergoing R-CHOP chemotherapy who was admitted with right lower extremity cellulitis on 07/04/2012.  Assessment/Plan: Principal Problem:  *Cellulitis and abscess of lower leg  Has had improvement on empiric vancomycin.  If she continues to show improvement, could discharge home on 7 days of therapy with Septra. Active Problems:  Unspecified essential hypertension  Blood pressure currently normal. Not on any antihypertensives except for when necessary Lasix.  LEG EDEMA  Mild. The patient takes when necessary Lasix at home.  Lymphoma / Diffuse large B cell lymphoma  Followup with oncologist post discharge.  Hypokalemia  Corrected.  Severe malnutrition in the context of chronic illness / under weight  Seen by dietitian 07/04/2012. Continue Ensure supplements.  Thrombocytopenia / normocytic anemia  Likely the sequela of prior chemotherapy. No evidence of active bleeding. No current indication for transfusion.  Code Status: Full code. Family Communication: Husband at bedside. Disposition Plan: Likely home 07/05/2012.   Medical Consultants:  None  Other Consultants:  Dietitian  Anti-infectives:  Vancomycin 07/04/2012--->  Clindamycin 07/03/2012---> 07/03/2012  HPI/Subjective: The patient feels like her right lower extremity has improved. It is less tender. Still running low-grade fever. Appetite is good.  Objective: Filed Vitals:   07/03/12 1740 07/04/12 0108 07/04/12 0130 07/04/12 0227  BP: 101/49  119/52 107/47  Pulse: 110  88   Temp: 100.3 F (37.9 C)  98.9 F (37.2 C) 97.9 F (36.6 C)  TempSrc: Oral  Oral Oral  Resp: 20  16   Height:  5' 4.96" (1.65 m)  5' 0.5" (1.537 m)  Weight:  43.8 kg  (96 lb 9 oz)  38.42 kg (84 lb 11.2 oz)  SpO2: 97%  98% 96%    Intake/Output Summary (Last 24 hours) at 07/04/12 1352 Last data filed at 07/04/12 0700  Gross per 24 hour  Intake    360 ml  Output    250 ml  Net    110 ml    Exam: Gen:  NAD Cardiovascular:  RRR, No M/R/G Respiratory:  Lungs CTAB Gastrointestinal:  Abdomen soft, NT/ND, + BS Extremities:  RLE erythema, beginning to regress from inked margins.  Data Reviewed: Basic Metabolic Panel:  Lab 07/04/12 9562 07/03/12 1956  NA 132* 130*  K 3.7 3.0*  CL 96 88*  CO2 31 33*  GLUCOSE 124* 132*  BUN 13 20  CREATININE 0.64 0.86  CALCIUM 9.0 9.7  MG -- --  PHOS -- --   GFR Estimated Creatinine Clearance: 36.8 ml/min (by C-G formula based on Cr of 0.64). Liver Function Tests:  Lab 07/04/12 0600  AST 9  ALT 17  ALKPHOS 102  BILITOT 0.7  PROT 5.0*  ALBUMIN 2.7*   CBC:  Lab 07/04/12 0600 07/03/12 2030  WBC 6.7 4.6  NEUTROABS -- 2.9  HGB 7.8* 8.6*  HCT 22.9* 24.8*  MCV 100.0 97.6  PLT 27* 27*   Microbiology No results found for this or any previous visit (from the past 240 hour(s)).   Procedures and Diagnostic Studies: No results found.  Scheduled Meds:    . sodium chloride   Intravenous STAT  . acetaminophen  1,000 mg Oral q morning - 10a  . calcium carbonate  1 tablet Oral Daily  . cholecalciferol  1,000 Units Oral Daily  .  gabapentin  100 mg Oral TID  . lactose free nutrition  237 mL Oral TID WC  . mirtazapine  7.5 mg Oral QHS  . morphine  15 mg Oral q morning - 10a  . polyethylene glycol  17 g Oral q morning - 10a  . vancomycin  500 mg Intravenous Q24H   Continuous Infusions:    . dextrose 5 % and 0.9 % NaCl with KCl 40 mEq/L 75 mL/hr at 07/04/12 0247    Time spent: 25 minutes.   LOS: 1 day   Nathalee Smarr  [facility] Pager [phone].  If 8PM-8AM, please contact night-coverage at [email or web address], password [email or web address] 07/04/2012, 1:52 PM

## 2012-07-04 NOTE — ED Notes (Signed)
Patient resting with eyes closed. Awaiting bed assignment to the floor.

## 2012-07-04 NOTE — H&P (Signed)
[facility] History and Physical  Colleen Cooke WUJ:[id number] DOB: 17-Apr-1935    PCP:   [name], MD   Chief Complaint: leg swelling and pain.  HPI: Colleen Cooke is an 78 y.o. female with hx of NHL with Larg B cell transformation undergoing R-CHOP chemotherapy, hx of back pain with compressive Fx, presents to the ER as her right leg has become more painful and red. She denied any fever, SOB, or pleuritic CP.  Evaluation in the ER showed WBC of 4.6K with Hb of 8.6G/DL, and Platelet count of 27K. Her renal Fx tests were normal and her K was low at 3.0, with hyponatremia of 130.  Hospitalist was asked to admit her for cellulitis, and thrombocytopenia.  She has no evidence of spontaneous bleeding.  Rewiew of Systems:  Constitutional: Negative for malaise, fever and chills. No significant weight loss or weight gain Eyes: Negative for eye pain, redness and discharge, diplopia, visual changes, or flashes of light. ENMT: Negative for ear pain, hoarseness, nasal congestion, sinus pressure and sore throat. No headaches; tinnitus, drooling, or problem swallowing. Cardiovascular: Negative for chest pain, palpitations, diaphoresis, dyspnea and peripheral edema. ; No orthopnea, PND Respiratory: Negative for cough, hemoptysis, wheezing and stridor. No pleuritic chestpain. Gastrointestinal: Negative for nausea, vomiting, diarrhea, constipation, abdominal pain, melena, blood in stool, hematemesis, jaundice and rectal bleeding.    Genitourinary: Negative for frequency, dysuria, incontinence,flank pain and hematuria; Musculoskeletal: Negative for back pain and neck pain.  Skin: . Negative for pruritus, rash, abrasions, bruising and skin lesion.; ulcerations Neuro: Negative for headache, lightheadedness and neck stiffness. Negative for weakness, altered level of consciousness , altered mental status, extremity weakness, burning feet, involuntary movement, seizure and syncope.  Psych: negative  for anxiety, depression, insomnia, tearfulness, panic attacks, hallucinations, paranoia, suicidal or homicidal ideation    Past Medical History  Diagnosis Date  . Chicken pox   . 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 Non-Hodgkin's lymphoma, Burkitt's     Jan. 2013  . Back pain     pt has compression fractures in back  . Wears glasses   . Wears dentures     upper and lower  . Diffuse large B cell lymphoma 04/13/2012    Past Surgical History  Procedure Date  . Appendectomy     age 89  . Tonsillectomy     age 24  . Mass biopsy 04/04/2012    Procedure: NECK MASS BIOPSY;  Surgeon: Drema Halon, MD;  Location: [facility];  Service: ENT;  Laterality: Right;    Medications:  HOME MEDS: Prior to Admission medications   Medication Sig Start Date End Date Taking? Authorizing Provider  acetaminophen (TYLENOL) 500 MG tablet Take 1,000 mg by mouth every morning.   Yes Historical Provider, MD  alendronate (FOSAMAX) 70 MG tablet Take 70 mg by mouth every Sunday. Take with a full glass of water on an empty stomach.   Yes Historical Provider, MD  Calcium Carbonate (CALCIUM 600 PO) Take 630 mg by mouth daily.   Yes Historical Provider, MD  cholecalciferol (VITAMIN D) 1000 UNITS tablet Take 1,000 Units by mouth daily.   Yes Historical Provider, MD  furosemide (LASIX) 20 MG tablet Take 1 tablet (20 mg total) by mouth daily. 06/08/12  Yes [name], MD  gabapentin (NEURONTIN) 100 MG capsule Take 1 capsule (100 mg total) by mouth 3 (three) times daily. 03/23/12  Yes Myrtis Ser, NP  lidocaine-prilocaine (EMLA) cream Apply topically as needed. Put it on portacath  one hour before access. 04/13/12  Yes Exie Parody, MD  mirtazapine (REMERON) 7.5 MG tablet Take 7.5 mg by mouth at bedtime.   Yes Historical Provider, MD  morphine (MSIR) 15 MG tablet Take 15 mg by mouth every morning. 04/14/12  Yes Exie Parody, MD  ondansetron (ZOFRAN) 8 MG tablet Take 1 tablet (8 mg total) by mouth 2  (two) times daily. Take two times a day starting the day after chemo for 3 days. Then take two times a day as needed for nausea or vomiting. 04/13/12  Yes Exie Parody, MD  OVER THE COUNTER MEDICATION Take 1,000 mg by mouth daily. Emergency vitamin c powder   Yes Historical Provider, MD  pegfilgrastim (NEULASTA) 6 MG/0.6ML injection Inject 6 mg into the skin once.   Yes Historical Provider, MD  polyethylene glycol (MIRALAX / GLYCOLAX) packet Take 17 g by mouth every morning.   Yes Historical Provider, MD  predniSONE (DELTASONE) 20 MG tablet Take 2 tablets (40 mg total) by mouth daily. Take on days 1-5 of chemotherapy. 04/13/12  Yes Exie Parody, MD  PRESCRIPTION MEDICATION Inject 2 mg into the vein once. vinCRIStine (ONCOVIN) 2 mg in sodium chloride 0.9 % 50 mL chemo infusion  2 mg  Once  06/26/2012  06/26/2012      Route: Intravenous   Yes Historical Provider, MD  PRESCRIPTION MEDICATION Inject 40 mg into the vein once. DOXOrubicin (ADRIAMYCIN) chemo injection 40 mg 30 mg/m2  1.32 m2 (Treatment Plan Actual)   Yes Historical Provider, MD  PRESCRIPTION MEDICATION Inject 600 mg into the vein once. cyclophosphamide (CYTOXAN) 600 mg in sodium chloride 0.9 % 250 mL chemo infusion 450 mg/m2  1.32 m2 (Treatment Plan Actual)   Yes Historical Provider, MD  PRESCRIPTION MEDICATION Inject 500 mg into the vein once. riTUXimab (RITUXAN) 500 mg in sodium chloride 0.9 % 250 mL chemo infusion 375 mg/m2  1.32 m2 (Treatment Plan Actual)  Once 06/26/2012    Route:  Intravenous   Yes Historical Provider, MD  Diphenhyd-Hydrocort-Nystatin SUSP Swish and spit 15 mLs 4 (four) times daily as needed (Swish/gargle and spit as needed for mouth soreness). 03/23/12   Myrtis Ser, NP  prochlorperazine (COMPAZINE) 10 MG tablet Take 1 tablet (10 mg total) by mouth every 6 (six) hours as needed (Nausea or vomiting). 04/13/12   Exie Parody, MD     Allergies:  Allergies  Allergen Reactions  . Penicillins     REACTION: swelling  localized    Social History:   reports that she quit smoking about 18 years ago. Her smoking use included Cigarettes. She has a 20 pack-year smoking history. She has never used smokeless tobacco. She reports that she drinks about .6 ounces of alcohol per week. She reports that she does not use illicit drugs.  Family History: Family History  Problem Relation Age of Onset  . Coronary artery disease Mother 14     Physical Exam: Filed Vitals:   07/03/12 1740 07/04/12 0108 07/04/12 0130 07/04/12 0227  BP: 101/49  119/52 107/47  Pulse: 110  88   Temp: 100.3 F (37.9 C)  98.9 F (37.2 C) 97.9 F (36.6 C)  TempSrc: Oral  Oral Oral  Resp: 20  16   Height:  5' 4.96" (1.65 m)  5' 0.5" (1.537 m)  Weight:  43.8 kg (96 lb 9 oz)  38.42 kg (84 lb 11.2 oz)  SpO2: 97%  98% 96%   Blood pressure 107/47, pulse 88, temperature 97.9  F (36.6 C), temperature source Oral, resp. rate 16, height 5' 0.5" (1.537 m), weight 38.42 kg (84 lb 11.2 oz), SpO2 96.00%.  GEN:  Pleasant patient lying in the stretcher in no acute distress; cooperative with exam. She is quite cachectic\ PSYCH:  alert and oriented x4; does not appear anxious or depressed; affect is appropriate. HEENT: Mucous membranes pink and anicteric; PERRLA; EOM intact; no cervical lymphadenopathy nor thyromegaly or carotid bruit; no JVD; There were no stridor. Neck is very supple. Breasts:: Not examined CHEST WALL: No tenderness.  Port without any evidence of infection. CHEST: Normal respiration, clear to auscultation bilaterally.  HEART: Regular rate and rhythm.  There are no murmur, rub, or gallops.   BACK: No kyphosis or scoliosis; no CVA tenderness ABDOMEN: soft and non-tender; no masses, no organomegaly, normal abdominal bowel sounds; no pannus; no intertriginous candida. There is no rebound and no distention. Rectal Exam: Not done EXTREMITIES: No bone or joint deformity; age-appropriate arthropathy of the hands and knees; no edema;  There  is no calf tenderness.  She has severe redness and warm, consistent with cellulitis. Genitalia: not examined PULSES: 2+ and symmetric SKIN: Normal hydration no rash or ulceration CNS: Cranial nerves 2-12 grossly intact no focal lateralizing neurologic deficit.  Speech is fluent; uvula elevated with phonation, facial symmetry and tongue midline. DTR are normal bilaterally, cerebella exam is intact, barbinski is negative and strengths are equaled bilaterally.  No sensory loss.   Labs on Admission:  Basic Metabolic Panel:  Lab 07/03/12 4098  NA 130*  K 3.0*  CL 88*  CO2 33*  GLUCOSE 132*  BUN 20  CREATININE 0.86  CALCIUM 9.7  MG --  PHOS --   Liver Function Tests: No results found for this basename: AST:5,ALT:5,ALKPHOS:5,BILITOT:5,PROT:5,ALBUMIN:5 in the last 168 hours No results found for this basename: LIPASE:5,AMYLASE:5 in the last 168 hours No results found for this basename: AMMONIA:5 in the last 168 hours CBC:  Lab 07/03/12 2030  WBC 4.6  NEUTROABS 2.9  HGB 8.6*  HCT 24.8*  MCV 97.6  PLT 27*   Cardiac Enzymes: No results found for this basename: CKTOTAL:5,CKMB:5,CKMBINDEX:5,TROPONINI:5 in the last 168 hours  CBG: No results found for this basename: GLUCAP:5 in the last 168 hours   Radiological Exams on Admission: No results found.   Assessment/Plan Present on Admission:  . Cellulitis and abscess of lower leg . Lymphoma . LEG EDEMA . Unspecified essential hypertension . Diffuse large B cell lymphoma  PLAN:  Will admit her for cellulitis. She was given Clinda in the ER.  I will switch over to Vanco IV.   Will give her some IV pain meds as well.  I have continue her home meds.  Her platelet count was very low at 27K, so we will d/c the prophylaxis lovenox.  Please follow closely and transfuse if it drops any further.  She is stable, full code, and will be admitted to [facility] service.  Other plans as per orders.  Code Status: FULL Unk Lightning, MD. [facility] Pager [phone] 7pm to 7am.  07/04/2012, 5:30 AM

## 2012-07-04 NOTE — ED Notes (Signed)
Patient ambulated to the bathroom with one person stand by assist.

## 2012-07-04 NOTE — Progress Notes (Signed)
PHARMACIST - PHYSICIAN COMMUNICATION  CONCERNING: P&T Medication Policy Regarding Oral Bisphosphonates  RECOMMENDATION: Your order for alendronate (Fosamax) has been discontinued at this time.  If the patient's post-hospital medical condition warrants safe use of this class of drugs, please resume the pre-hospital regimen upon discharge.  DESCRIPTION:  Alendronate (Fosamax) can cause severe esophageal erosions in patients who are unable to remain upright at least 30 minutes after taking this medication.   Since brief interruptions in therapy are thought to have minimal impact on bone mineral density, the Pharmacy & Therapeutics Committee has established that bisphosphonate orders should be routinely discontinued during hospitalization.   To override this safety policy and permit administration of Boniva, Fosamax, or Actonel in the hospital, prescribers must write "DO NOT HOLD" in the comments section when placing the order for this class of medications.  Terrilee Files, PHarmD 07/04/12 @ 02:36

## 2012-07-04 NOTE — Progress Notes (Signed)
INITIAL NUTRITION ASSESSMENT  Pt meets criteria for severe MALNUTRITION in the context of chronic illness as evidenced by 12.5% weight loss in the past month in addition to pt with visible severe muscle wasting and subcutaneous fat loss in clavicles, hand, and acromion bone region.  DOCUMENTATION CODES Per approved criteria  -Severe malnutrition in the context of chronic illness -Underweight   INTERVENTION: - Boost Plus TID - Will continue to monitor   NUTRITION DIAGNOSIS: Increased nutrient needs related to non-Hodgkin's lymphoma on outpatient chemotherapy as evidenced by MD notes.   Goal: Pt to consume >90% of meals/supplements.   Monitor:  Weights, labs, intake  Reason for Assessment: Nutrition risk   78 y.o. female  Admitting Dx: Cellulitis and abscess of lower leg  ASSESSMENT: Pt reports eating 3 meals/day plus snacks and drinks 3 Boost per day. Pt reports typically weighing between 92-96 pounds. Pt with non-Hodgkin's lymphoma and stated she went through chemotherapy in January-March of last year during which time she fractured her back and lost 35 pounds and 5 inches. She states that now her rib cage sits directly on top of her hip bone which causes her pain. Pt reports her dentures to not fit properly from all the weight loss. Pt c/o constant dry mouth and states she uses Biotene and Magic Mouthwash. Pt getting outpatient chemotherapy. Performed limited nutrition focused physical exam.   Nutrition Focused Physical Exam: Subcutaneous Fat:  Orbital Region: mild/moderate wasting Upper Arm Region: mild/moderate wasting Thoracic and Lumbar Region: NA  Muscle:  Temple Region: mild/moderate wasting Clavicle Bone Region: severe wasting Clavicle and Acromion Bone Region: severe wasting Scapular Bone Region: NA Dorsal Hand: severe wasting Patellar Region: NA Anterior Thigh Region: NA Posterior Calf Region: NA  Edema: RLE cellulitis, slightly edematous per  nursing   Height: Ht Readings from Last 1 Encounters:  07/04/12 5' 0.5" (1.537 m)    Weight: Wt Readings from Last 1 Encounters:  07/04/12 84 lb 11.2 oz (38.42 kg)    Ideal Body Weight: 100 lb  % Ideal Body Weight: 84  Wt Readings from Last 10 Encounters:  07/04/12 84 lb 11.2 oz (38.42 kg)  06/23/12 96 lb 8 oz (43.772 kg)  06/08/12 102 lb (46.267 kg)  06/02/12 96 lb 9.6 oz (43.817 kg)  05/12/12 97 lb 11.2 oz (44.316 kg)  05/10/12 97 lb 8 oz (44.226 kg)  05/04/12 94 lb 12.8 oz (43.001 kg)  04/24/12 95 lb 14.4 oz (43.5 kg)  04/18/12 90 lb (40.824 kg)  04/04/12 90 lb 2 oz (40.88 kg)    Usual Body Weight: 92-96 lbs  % Usual Body Weight: 87-91  BMI:  Body mass index is 16.27 kg/(m^2).  Estimated Nutritional Needs: Kcal: 1150-1350 Protein: 60-75g Fluid: 1.1-1.3L/day  Diet Order: General  EDUCATION NEEDS: -No education needs identified at this time   Intake/Output Summary (Last 24 hours) at 07/04/12 1136 Last data filed at 07/04/12 0700  Gross per 24 hour  Intake    360 ml  Output    250 ml  Net    110 ml    Last BM: 1/6  Labs:   Lab 07/04/12 0600 07/03/12 1956  NA 132* 130*  K 3.7 3.0*  CL 96 88*  CO2 31 33*  BUN 13 20  CREATININE 0.64 0.86  CALCIUM 9.0 9.7  MG -- --  PHOS -- --  GLUCOSE 124* 132*    CBG (last 3)  No results found for this basename: GLUCAP:3 in the last 72 hours  Scheduled  Meds:   . sodium chloride   Intravenous STAT  . acetaminophen  1,000 mg Oral q morning - 10a  . calcium carbonate  1 tablet Oral Daily  . cholecalciferol  1,000 Units Oral Daily  . gabapentin  100 mg Oral TID  . mirtazapine  7.5 mg Oral QHS  . morphine  15 mg Oral q morning - 10a  . polyethylene glycol  17 g Oral q morning - 10a  . vancomycin  500 mg Intravenous Q24H    Continuous Infusions:   . dextrose 5 % and 0.9 % NaCl with KCl 40 mEq/L 75 mL/hr at 07/04/12 0247    Past Medical History  Diagnosis Date  . Chicken pox   . Follicular  lymphoma 05/2011  . Non-Hodgkin's lymphoma, Burkitt's     Jan. 2013  . Back pain     pt has compression fractures in back  . Wears glasses   . Wears dentures     upper and lower  . Diffuse large B cell lymphoma 04/13/2012    Past Surgical History  Procedure Date  . Appendectomy     age 82  . Tonsillectomy     age 78  . Mass biopsy 04/04/2012    Procedure: NECK MASS BIOPSY;  Surgeon: Drema Halon, MD;  Location: [facility];  Service: ENT;  Laterality: Right;    Levon Hedger MS, RD, LDN [phone] Pager [phone] After Hours Pager

## 2012-07-05 DIAGNOSIS — D649 Anemia, unspecified: Secondary | ICD-10-CM

## 2012-07-05 DIAGNOSIS — L03119 Cellulitis of unspecified part of limb: Principal | ICD-10-CM

## 2012-07-05 LAB — BASIC METABOLIC PANEL
BUN: 6 mg/dL (ref 6–23)
Chloride: 101 mEq/L (ref 96–112)
Creatinine, Ser: 0.53 mg/dL (ref 0.50–1.10)
GFR calc Af Amer: >90 mL/min (ref >90)
Glucose, Bld: 106 mg/dL — ABNORMAL HIGH (ref 70–99)

## 2012-07-05 LAB — CBC
HCT: 23.6 % — ABNORMAL LOW (ref 36.0–46.0)
Hemoglobin: 8 g/dL — ABNORMAL LOW (ref 12.0–15.0)
MCHC: 33.9 g/dL (ref 30.0–36.0)
MCV: 102.2 fL — ABNORMAL HIGH (ref 78.0–100.0)
RDW: 16.6 % — ABNORMAL HIGH (ref 11.5–15.5)
WBC: 12.9 10*3/uL — ABNORMAL HIGH (ref 4.0–10.5)

## 2012-07-05 MED ORDER — DOXYCYCLINE HYCLATE 100 MG PO TABS: 100.0000 mg | ORAL_TABLET | Freq: Two times a day (BID) | ORAL | Status: DC

## 2012-07-05 MED ORDER — BOOST PLUS PO LIQD: 237.0000 mL | Freq: Three times a day (TID) | ORAL | Status: DC

## 2012-07-05 MED ORDER — MORPHINE SULFATE 15 MG PO TABS
15.0000 mg | ORAL_TABLET | Freq: Every morning | ORAL | Status: DC
Start: 2012-07-05 — End: 2012-09-04

## 2012-07-05 NOTE — ED Provider Notes (Signed)
Medical screening examination/treatment/procedure(s) were conducted as a shared visit with non-physician practitioner(s) and myself.  I personally evaluated the patient during the encounter.  Pt stable, NAD.  Note bright red cellulitic changes on right lower leg.  Directed antibiotic coverage.  Pt admitted for parenteral antibiotics and further evaluation.  Tobin Chad, MD 07/05/12 [phone]

## 2012-07-05 NOTE — Progress Notes (Addendum)
[facility] PROGRESS NOTE  Azalyn Sliwa ZOX:[id number] DOB: Nov 13, 1934 DOA: 07/03/2012 PCP: [name], MD  Brief narrative: 78 year old female with past medical history of large B cell non-Hodgkin's lymphoma, under the care of Dr. Gaylyn Rong, currently undergoing R-CHOP chemotherapy who was admitted with right lower extremity cellulitis on 07/04/2012.    Assessment/Plan:   Principal Problem:  *Cellulitis and abscess of lower leg  Slowly improving with vancomycin If she continues to show improvement, could discharge home on 7 days of therapy with Septra.  Active Problems:  Unspecified essential hypertension  Lasix as needed Blood pressure at goal LEG EDEMA  No lower extremity edema evident. The patient takes when necessary Lasix at home. Lymphoma / Diffuse large B cell lymphoma  Followup with oncologist post discharge. Hypokalemia  Corrected. Severe malnutrition in the context of chronic illness / under weight  Seen by dietitian 07/04/2012. Continue Ensure supplements. Thrombocytopenia / normocytic anemia  Likely the sequela of prior chemotherapy. No evidence of active bleeding. No current indication for transfusion.   Code Status: Full code.  Family Communication: Husband at bedside.  Disposition Plan: Discharge in a.m.    Medical Consultants:  None Other Consultants:  Dietitian Anti-infectives:  Vancomycin 07/04/2012--->  Clindamycin 07/03/2012---> 07/03/2012  Manson Passey, MD  [facility] Pager [phone]  If 7PM-7AM, please contact night-coverage [email or web address] Password [email or web address] 07/05/2012, 4:03 PM   LOS: 2 days   HPI/Subjective: No acute events overnight.  Objective: Filed Vitals:   07/04/12 1426 07/04/12 2352 07/05/12 0537 07/05/12 1421  BP: 101/41 119/42 107/56 87/40  Pulse: 108 116 108 108  Temp: 98.2 F (36.8 C) 98.3 F (36.8 C) 98 F (36.7 C) 98.1 F (36.7 C)  TempSrc:  Oral Oral   Resp: 18 20 20 18   Height:      Weight:      SpO2: 96% 99% 98% 95%     Intake/Output Summary (Last 24 hours) at 07/05/12 1603 Last data filed at 07/05/12 1300  Gross per 24 hour  Intake   3387 ml  Output   1600 ml  Net   1787 ml    Exam:   General:  Pt is alert, follows commands appropriately, not in acute distress  Cardiovascular: Regular rate and rhythm, S1/S2, no murmurs, no rubs, no gallops  Respiratory: Clear to auscultation bilaterally, no wheezing, no crackles, no rhonchi  Abdomen: Soft, non tender, non distended, bowel sounds present, no guarding  Extremities: Right lower extremity erythema and warmth, pulses DP and PT palpable bilaterally  Neuro: Grossly nonfocal  Data Reviewed: Basic Metabolic Panel:  Lab 07/05/12 8295 07/04/12 0600 07/03/12 1956  NA 134* 132* 130*  K 4.1 3.7 3.0*  CL 101 96 88*  CO2 27 31 33*  GLUCOSE 106* 124* 132*  BUN 6 13 20   CREATININE 0.53 0.64 0.86  CALCIUM 9.1 9.0 9.7  MG -- -- --  PHOS -- -- --   Liver Function Tests:  Lab 07/04/12 0600  AST 9  ALT 17  ALKPHOS 102  BILITOT 0.7  PROT 5.0*  ALBUMIN 2.7*   CBC:  Lab 07/05/12 0610 07/04/12 0600 07/03/12 2030  WBC 12.9* 6.7 4.6  NEUTROABS -- -- 2.9  HGB 8.0* 7.8* 8.6*  HCT 23.6* 22.9* 24.8*  MCV 102.2* 100.0 97.6  PLT 45* 27* 27*    Studies: No results found.  Scheduled Meds:   . acetaminophen  1,000 mg Oral q morning - 10a  . calcium carbonate  1 tablet Oral Daily  . cholecalciferol  1,000 Units Oral Daily  . gabapentin  100 mg Oral TID  . lactose free nutrition  237 mL Oral TID WC  . mirtazapine  7.5 mg Oral QHS  . morphine  15 mg Oral q morning - 10a  . polyethylene glycol  17 g Oral q morning - 10a  . vancomycin  500 mg Intravenous Q24H   Continuous Infusions:   . dextrose 5 % and 0.9 % NaCl with KCl 40 mEq/L 75 mL/hr at 07/05/12 [phone]

## 2012-07-06 ENCOUNTER — Other Ambulatory Visit: Payer: Self-pay | Admitting: Oncology

## 2012-07-06 ENCOUNTER — Telehealth: Payer: Self-pay | Admitting: Oncology

## 2012-07-06 DIAGNOSIS — E871 Hypo-osmolality and hyponatremia: Secondary | ICD-10-CM

## 2012-07-06 LAB — BASIC METABOLIC PANEL
BUN: 6 mg/dL (ref 6–23)
Calcium: 9 mg/dL (ref 8.4–10.5)
Creatinine, Ser: 0.52 mg/dL (ref 0.50–1.10)
GFR calc Af Amer: >90 mL/min (ref >90)
GFR calc non Af Amer: >90 mL/min (ref >90)
Glucose, Bld: 95 mg/dL (ref 70–99)
Potassium: 4.3 mEq/L (ref 3.5–5.1)

## 2012-07-06 LAB — CBC
HCT: 23.9 % — ABNORMAL LOW (ref 36.0–46.0)
MCH: 34.3 pg — ABNORMAL HIGH (ref 26.0–34.0)
MCHC: 33.5 g/dL (ref 30.0–36.0)
MCV: 102.6 fL — ABNORMAL HIGH (ref 78.0–100.0)
RDW: 16.4 % — ABNORMAL HIGH (ref 11.5–15.5)

## 2012-07-06 MED ORDER — HEPARIN SOD (PORK) LOCK FLUSH 100 UNIT/ML IV SOLN
INTRAVENOUS | Status: AC
  Administered 2012-07-06: 500 [IU]
  Filled 2012-07-06: qty 5

## 2012-07-06 NOTE — Discharge Summary (Signed)
Physician Discharge Summary  Colleen Cooke AVW:[id number] DOB: June 14, 1935 DOA: 07/03/2012  PCP: [name], MD  Admit date: 07/03/2012 Discharge date: 07/06/2012  Discharge Diagnoses:  Principal Problem:  *Cellulitis and abscess of lower leg Active Problems:  Unspecified essential hypertension  LEG EDEMA  Lymphoma  Diffuse large B cell lymphoma  Hypokalemia  Severe malnutrition in the context of chronic illness  Underweight  Thrombocytopenia  Normocytic anemia   Discharge Condition: medically stable for discharge today; patient insists on going home and says she will follow up with PCP as instructed Monday or Tuesday (1/13 or 1/14)  Diet recommendation: as tolerated  History of present illness:  78 year old female with past medical history of large B cell non-Hodgkin's lymphoma, under the care of Dr. Gaylyn Rong, currently undergoing R-CHOP chemotherapy who was admitted with right lower extremity cellulitis on 07/04/2012.   Assessment/Plan:   Principal Problem:  *Cellulitis and abscess of lower leg  Slowly improving with vancomycin  We will discharge home with doxycycline 100 mg Q 12 hours for 10 days Active Problems:  Unspecified essential hypertension  Lasix as needed  Blood pressure at goal; 110/42 LEG EDEMA  No lower extremity edema evident. The patient takes when necessary Lasix at home. Lymphoma / Diffuse large B cell lymphoma  Followup with oncologist post discharge. Hypokalemia  Corrected. Potassium 4.3 today Severe malnutrition in the context of chronic illness / under weight  Seen by dietitian 07/04/2012. Continue Ensure supplements. Thrombocytopenia / normocytic anemia  Likely the sequela of prior chemotherapy. No evidence of active bleeding. No current indication for transfusion. Trending  up   Code Status: Full code.  Family Communication: Husband at bedside.  Disposition Plan: Discharge today   Medical Consultants:  None Other Consultants:   Dietitian Anti-infectives:  Doxycycline for 10 days on discharge 100 mg Q 12 hours Vancomycin 07/04/2012---> 07/06/2012 Clindamycin 07/03/2012---> 07/03/2012   Manson Passey, MD  [facility]  Pager [phone]   Discharge Exam: Filed Vitals:   07/06/12 0627  BP: 110/42  Pulse: 106  Temp: 98.4 F (36.9 C)  Resp: 18   Filed Vitals:   07/04/12 2352 07/05/12 0537 07/05/12 1421 07/06/12 0627  BP: 119/42 107/56 87/40 110/42  Pulse: 116 108 108 106  Temp: 98.3 F (36.8 C) 98 F (36.7 C) 98.1 F (36.7 C) 98.4 F (36.9 C)  TempSrc: Oral Oral    Resp: 20 20 18 18   Height:      Weight:      SpO2: 99% 98% 95% 99%    General: Pt is alert, follows commands appropriately, not in acute distress Cardiovascular: Regular rate and rhythm, S1/S2 +, no murmurs, no rubs, no gallops Respiratory: Clear to auscultation bilaterally, no wheezing, no crackles, no rhonchi Abdominal: Soft, non tender, non distended, bowel sounds +, no guarding Extremities:lower extremity cellulitis; somewhat improving but still with erythema confined to outlined borders(done on admission with the pen) Neuro: Grossly nonfocal  Discharge Instructions  Discharge Orders    Future Appointments: Provider: Department: Dept Phone: Center:   07/14/2012 3:15 PM Windell Hummingbird [facility] MEDICAL ONCOLOGY [phone] None   07/14/2012 3:45 PM Myrtis Ser, NP [facility] MEDICAL ONCOLOGY [phone] None   07/17/2012 9:00 AM [facility] MED[facility] [phone] None   07/18/2012 9:00 AM [facility] Inj Nurse [facility] MEDICAL ONCOLOGY [phone] None   08/04/2012 9:30 AM Krista Blue [facility] MEDICAL ONCOLOGY [phone] None   08/04/2012 10:00  AM Exie Parody, MD [facility] MEDICAL ONCOLOGY [phone] None   08/07/2012 9:15 AM [facility] MEDICAL ONCOLOGY [phone] None   08/08/2012 9:00 AM  [facility] Inj Nurse [facility] MEDICAL ONCOLOGY [phone] None   09/05/2012 10:45 AM [name], MD [facility] [phone] [employer]     Future Orders Please Complete By Expires   Diet - low sodium heart healthy      Increase activity slowly      Discharge instructions      Comments:   Please follow up with PCP Monday or Tuesday (07/10/2012 or 07/11/2012) to make sure cellulitis is improving   Call MD for:  persistant nausea and vomiting      Call MD for:  severe uncontrolled pain      Call MD for:  difficulty breathing, headache or visual disturbances      Call MD for:  persistant dizziness or light-headedness          Medication List     As of 07/06/2012  9:28 AM    STOP taking these medications         NEULASTA 6 MG/0.6ML injection   Generic drug: pegfilgrastim      TAKE these medications         acetaminophen 500 MG tablet   Commonly known as: TYLENOL   Take 1,000 mg by mouth every morning.      alendronate 70 MG tablet   Commonly known as: FOSAMAX   Take 70 mg by mouth every Sunday. Take with a full glass of water on an empty stomach.      CALCIUM 600 PO   Take 630 mg by mouth daily.      cholecalciferol 1000 UNITS tablet   Commonly known as: VITAMIN D   Take 1,000 Units by mouth daily.      Diphenhyd-Hydrocort-Nystatin Susp   Swish and spit 15 mLs 4 (four) times daily as needed (Swish/gargle and spit as needed for mouth soreness).      doxycycline 100 MG tablet   Commonly known as: VIBRA-TABS   Take 1 tablet (100 mg total) by mouth 2 (two) times daily.      furosemide 20 MG tablet   Commonly known as: LASIX   Take 1 tablet (20 mg total) by mouth daily.      gabapentin 100 MG capsule   Commonly known as: NEURONTIN   Take 1 capsule (100 mg total) by mouth 3 (three) times daily.      lactose free nutrition Liqd   Take 237 mLs by mouth 3 (three) times daily with meals.      lidocaine-prilocaine cream    Commonly known as: EMLA   Apply topically as needed. Put it on portacath one hour before access.      mirtazapine 7.5 MG tablet   Commonly known as: REMERON   Take 7.5 mg by mouth at bedtime.      morphine 15 MG tablet   Commonly known as: MSIR   Take 1 tablet (15 mg total) by mouth every morning.      ondansetron 8 MG tablet   Commonly known as: ZOFRAN   Take 1 tablet (8 mg total) by mouth 2 (two) times daily. Take two times a day starting the day after chemo for 3 days. Then take two times a day as needed for nausea or vomiting.      OVER THE COUNTER MEDICATION  Take 1,000 mg by mouth daily. Emergency vitamin c powder      polyethylene glycol packet   Commonly known as: MIRALAX / GLYCOLAX   Take 17 g by mouth every morning.      predniSONE 20 MG tablet   Commonly known as: DELTASONE   Take 2 tablets (40 mg total) by mouth daily. Take on days 1-5 of chemotherapy.      PRESCRIPTION MEDICATION   Inject 2 mg into the vein once. vinCRIStine (ONCOVIN) 2 mg in sodium chloride 0.9 % 50 mL chemo infusion  2 mg  Once  06/26/2012  06/26/2012       Route: Intravenous      PRESCRIPTION MEDICATION   Inject 40 mg into the vein once. DOXOrubicin (ADRIAMYCIN) chemo injection 40 mg 30 mg/m2  1.32 m2 (Treatment Plan Actual)      PRESCRIPTION MEDICATION   Inject 600 mg into the vein once. cyclophosphamide (CYTOXAN) 600 mg in sodium chloride 0.9 % 250 mL chemo infusion 450 mg/m2  1.32 m2 (Treatment Plan Actual)      PRESCRIPTION MEDICATION   Inject 500 mg into the vein once. riTUXimab (RITUXAN) 500 mg in sodium chloride 0.9 % 250 mL chemo infusion 375 mg/m2  1.32 m2 (Treatment Plan Actual)  Once 06/26/2012     Route:  Intravenous      prochlorperazine 10 MG tablet   Commonly known as: COMPAZINE   Take 1 tablet (10 mg total) by mouth every 6 (six) hours as needed (Nausea or vomiting).           Follow-up Information    Follow up with [name], MD. In 2 weeks.   Contact  information:   [address] [place] [address] [phone]           The results of significant diagnostics from this hospitalization (including imaging, microbiology, ancillary and laboratory) are listed below for reference.    Significant Diagnostic Studies: No results found.  Microbiology: No results found for this or any previous visit (from the past 240 hour(s)).   Labs: Basic Metabolic Panel:  Lab 07/06/12 9147 07/05/12 0610 07/04/12 0600 07/03/12 1956  NA 134* 134* 132* 130*  K 4.3 4.1 3.7 3.0*  CL 102 101 96 88*  CO2 28 27 31  33*  GLUCOSE 95 106* 124* 132*  BUN 6 6 13 20   CREATININE 0.52 0.53 0.64 0.86  CALCIUM 9.0 9.1 9.0 9.7   Liver Function Tests:  Lab 07/04/12 0600  AST 9  ALT 17  ALKPHOS 102  BILITOT 0.7  PROT 5.0*  ALBUMIN 2.7*   CBC:  Lab 07/06/12 0700 07/05/12 0610 07/04/12 0600 07/03/12 2030  WBC 13.6* 12.9* 6.7 4.6  NEUTROABS -- -- -- 2.9  HGB 8.0* 8.0* 7.8* 8.6*  HCT 23.9* 23.6* 22.9* 24.8*  MCV 102.6* 102.2* 100.0 97.6  PLT 91* 45* 27* 27*   BNP: BNP (last 3 results)  Basename 08/26/11 1516  PROBNP 252.0*   CBG: No results found for this basename: GLUCAP:5 in the last 168 hours  Time coordinating discharge: Over 30 minutes  Signed:  Manson Passey, MD  [facility] 07/06/2012, 9:28 AM  Pager #: [phone]

## 2012-07-06 NOTE — ED Provider Notes (Signed)
Medical screening examination/treatment/procedure(s) were performed by non-physician practitioner and as supervising physician I was immediately available for consultation/collaboration.  Tobin Chad, MD 07/06/12 [phone]

## 2012-07-06 NOTE — Telephone Encounter (Signed)
s.w. pt and gv new appt time....pt ok

## 2012-07-06 NOTE — Progress Notes (Signed)
Patient is currently active with [facility] Care Management for chronic disease management services.  Patient has been engaged by a [employer].  Our community based plan of care has focused on supporting her during ongoing lymphoma treatments and nutrition management.  Patient has been able to gain weight in the last thirty days despite her chemotherapy.  At this time the home is appropriate for her level of care needs.  Patient will receive a post discharge transition of care call and will be evaluated for monthly home visits for assessments and disease process education. Of note, [facility] Care Management services does not replace or interfere with any services that are arranged by inpatient case management or social work.  For additional questions or referrals please contact Anibal Henderson BSN RN [facility] Liaison at [phone].

## 2012-07-10 ENCOUNTER — Ambulatory Visit (INDEPENDENT_AMBULATORY_CARE_PROVIDER_SITE_OTHER): Payer: Medicare HMO | Admitting: Family Medicine

## 2012-07-10 ENCOUNTER — Encounter: Payer: Self-pay | Admitting: Family Medicine

## 2012-07-10 VITALS — BP 120/64 | Temp 98.4°F | Wt 91.0 lb

## 2012-07-10 DIAGNOSIS — C8589 Other specified types of non-Hodgkin lymphoma, extranodal and solid organ sites: Secondary | ICD-10-CM

## 2012-07-10 DIAGNOSIS — L03115 Cellulitis of right lower limb: Secondary | ICD-10-CM

## 2012-07-10 DIAGNOSIS — C8590 Non-Hodgkin lymphoma, unspecified, unspecified site: Secondary | ICD-10-CM

## 2012-07-10 DIAGNOSIS — L02419 Cutaneous abscess of limb, unspecified: Secondary | ICD-10-CM

## 2012-07-10 MED ORDER — CEPHALEXIN 500 MG PO CAPS: 500.0000 mg | ORAL_CAPSULE | Freq: Three times a day (TID) | ORAL | Status: DC

## 2012-07-10 MED ORDER — DIPHENHYD-HYDROCORT-NYSTATIN MT SUSP: 15.0000 mL | Freq: Four times a day (QID) | OROMUCOSAL | Status: DC | PRN

## 2012-07-10 NOTE — Progress Notes (Signed)
  Subjective:    Patient ID: Colleen Cooke, female    DOB: 08/12/1934, 78 y.o.   MRN: [id number]  HPI Hospital followup. Patient was admitted on 07/03/2012 and discharged 07/06/2012 with cellulitis right lower leg. She's had previous cellulitis left lower extremity. Her past medical history significant for hypertension, diffuse large B-cell lymphoma and history of severe malnutrition related to her chronic illness. She's been undergoing chemotherapy recently. She was admitted and treated initially with vancomycin and discharged on doxycycline. Both patient and husband feel that her cellulitis has not improved and possibly slightly worse since discharge. She's not any systemic fever or chills. She has pain and increased warmth.  She had hypokalemia on admission which was corrected at discharge. She had platelet count of 27 thousand on admission and this was up to 91,000 K at discharge. White blood cell count 4.6 thousand on admission and 13.6 at discharge. Albumin 2.7. Patient was instructed to stop Neulasta. She has followup with oncologist soon.  She's noticed some sores in her mouth mostly involving tongue. No thrush.  Past Medical History  Diagnosis Date  . Chicken pox   . 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 Non-Hodgkin's lymphoma, Burkitt's     Jan. 2013  . Back pain     pt has compression fractures in back  . Wears glasses   . Wears dentures     upper and lower  . Diffuse large B cell lymphoma 04/13/2012   Past Surgical History  Procedure Date  . Appendectomy     age 74  . Tonsillectomy     age 14  . Mass biopsy 04/04/2012    Procedure: NECK MASS BIOPSY;  Surgeon: Drema Halon, MD;  Location: [facility];  Service: ENT;  Laterality: Right;    reports that she quit smoking about 18 years ago. Her smoking use included Cigarettes. She has a 20 pack-year smoking history. She has never used smokeless tobacco. She reports that she drinks about .6 ounces of alcohol per  week. She reports that she does not use illicit drugs. family history includes Coronary artery disease (age of onset:71) in her mother. Allergies  Allergen Reactions  . Penicillins     REACTION: swelling localized      Review of Systems  Constitutional: Positive for fatigue. Negative for fever and chills.  Respiratory: Negative for cough and shortness of breath.   Cardiovascular: Positive for leg swelling.  Gastrointestinal: Positive for nausea. Negative for vomiting.  Neurological: Negative for dizziness.       Objective:   Physical Exam  Constitutional: She appears well-developed and well-nourished.  HENT:       No thrush.  NO oral lesions.  Pulmonary/Chest: Effort normal and breath sounds normal. No respiratory distress. She has no wheezes. She has no rales.  Musculoskeletal: She exhibits edema.  Neurological: She is alert.  Skin:       Right leg reveals bright erythema and increased warmth and diffuse tenderness and edema. She has some pen marks from when she left the hospital and she has some erythema extending just beyond those marks at this time.          Assessment & Plan:  Cellulitis right lower extremity. Patient with history of large B-cell lymphoma. She was discharged on doxycycline. Add Keflex 500 mg 3 times a day for better strep coverage. Elevate legs frequently. Continue heating pad on low heat. Reassess next week and sooner as needed.

## 2012-07-10 NOTE — Patient Instructions (Signed)
Cellulitis Cellulitis is an infection of the skin and the tissue beneath it. The infected area is usually red and tender. Cellulitis occurs most often in the arms and lower legs.   CAUSES   Cellulitis is caused by bacteria that enter the skin through cracks or cuts in the skin. The most common types of bacteria that cause cellulitis are Staphylococcus and Streptococcus. SYMPTOMS    Redness and warmth.   Swelling.   Tenderness or pain.   Fever.  DIAGNOSIS  Your caregiver can usually determine what is wrong based on a physical exam. Blood tests may also be done. TREATMENT   Treatment usually involves taking an antibiotic medicine. HOME CARE INSTRUCTIONS    Take your antibiotics as directed. Finish them even if you start to feel better.   Keep the infected arm or leg elevated to reduce swelling.   Apply a warm cloth to the affected area up to 4 times per day to relieve pain.   Only take over-the-counter or prescription medicines for pain, discomfort, or fever as directed by your caregiver.   Keep all follow-up appointments as directed by your caregiver.  SEEK MEDICAL CARE IF:    You notice red streaks coming from the infected area.   Your red area gets larger or turns dark in color.   Your bone or joint underneath the infected area becomes painful after the skin has healed.   Your infection returns in the same area or another area.   You notice a swollen bump in the infected area.   You develop new symptoms.  SEEK IMMEDIATE MEDICAL CARE IF:    You have a fever.   You feel very sleepy.   You develop vomiting or diarrhea.   You have a general ill feeling (malaise) with muscle aches and pains.  MAKE SURE YOU:    Understand these instructions.   Will watch your condition.   Will get help right away if you are not doing well or get worse.  Document Released: 03/24/2005 Document Revised: 12/14/2011 Document Reviewed: 08/30/2011 [employer] Patient Information 2013  [employer].    

## 2012-07-14 ENCOUNTER — Other Ambulatory Visit (HOSPITAL_BASED_OUTPATIENT_CLINIC_OR_DEPARTMENT_OTHER): Payer: Medicare HMO | Admitting: Lab

## 2012-07-14 ENCOUNTER — Other Ambulatory Visit: Payer: Medicare HMO | Admitting: Lab

## 2012-07-14 ENCOUNTER — Encounter: Payer: Self-pay | Admitting: Oncology

## 2012-07-14 ENCOUNTER — Ambulatory Visit: Payer: Medicare HMO | Admitting: Oncology

## 2012-07-14 ENCOUNTER — Ambulatory Visit (HOSPITAL_BASED_OUTPATIENT_CLINIC_OR_DEPARTMENT_OTHER): Payer: Medicare HMO | Admitting: Oncology

## 2012-07-14 VITALS — BP 118/62 | HR 95 | Temp 97.0°F | Resp 20 | Ht 60.5 in | Wt 91.3 lb

## 2012-07-14 DIAGNOSIS — E441 Mild protein-calorie malnutrition: Secondary | ICD-10-CM

## 2012-07-14 DIAGNOSIS — C8330 Diffuse large B-cell lymphoma, unspecified site: Secondary | ICD-10-CM

## 2012-07-14 DIAGNOSIS — D649 Anemia, unspecified: Secondary | ICD-10-CM

## 2012-07-14 DIAGNOSIS — C8589 Other specified types of non-Hodgkin lymphoma, extranodal and solid organ sites: Secondary | ICD-10-CM

## 2012-07-14 DIAGNOSIS — K121 Other forms of stomatitis: Secondary | ICD-10-CM

## 2012-07-14 DIAGNOSIS — K1230 Oral mucositis (ulcerative), unspecified: Secondary | ICD-10-CM

## 2012-07-14 LAB — CBC WITH DIFFERENTIAL/PLATELET
BASO%: 0.3 % (ref 0.0–2.0)
EOS%: 0.6 % (ref 0.0–7.0)
HCT: 26 % — ABNORMAL LOW (ref 34.8–46.6)
LYMPH%: 7.6 % — ABNORMAL LOW (ref 14.0–49.7)
MCH: 33.2 pg (ref 25.1–34.0)
MCHC: 32.3 g/dL (ref 31.5–36.0)
MONO#: 0.9 10*3/uL (ref 0.1–0.9)
NEUT%: 84.2 % — ABNORMAL HIGH (ref 38.4–76.8)
Platelets: 355 10*3/uL (ref 145–400)
RBC: 2.53 10*6/uL — ABNORMAL LOW (ref 3.70–5.45)
WBC: 12.5 10*3/uL — ABNORMAL HIGH (ref 3.9–10.3)
lymph#: 1 10*3/uL (ref 0.9–3.3)

## 2012-07-14 LAB — COMPREHENSIVE METABOLIC PANEL (CC13)
ALT: 9 U/L (ref 0–55)
AST: 14 U/L (ref 5–34)
CO2: 32 mEq/L — ABNORMAL HIGH (ref 22–29)
Creatinine: 0.8 mg/dL (ref 0.6–1.1)
Sodium: 138 mEq/L (ref 136–145)
Total Bilirubin: 0.23 mg/dL (ref 0.20–1.20)
Total Protein: 6.1 g/dL — ABNORMAL LOW (ref 6.4–8.3)

## 2012-07-14 NOTE — Progress Notes (Signed)
[facility]  Telephone:(336) [phone] Fax:([phone]) [phone]   OFFICE PROGRESS NOTE   Cc:  [name], MD  DIAGNOSIS: Transformed diffuse large B-cell lymphoma from follicular B-cell NonHodgkin's lymphoma; stage II.   PAST THERAPY: For history of stage IV, low grade but high risk follicular lymphoma: 3 cycles of Bendamustine/Rituxan between Jan 2013 and March 2013. She stopped chemo due to severe pain from compression fracture and severe bone pain.   CURRENT THERAPY: started mini-RCHOP 04/24/2012; goal for 6 cycles total.   INTERVAL HISTORY: Colleen Cooke 79 y.o. female returns for regular follow up for cycle #5 of chemo dose-reduced R-CHOP. Recently hospitalized for cellulitis of the right lower leg. Received IV Vancomycin inpatient then discharged home on Doxycycline. Still having pain and redness to RLE. Cellulitis not improving significantly. Seen by PCP 2 days ago and Kelex was added to Doxycycline. She has mild fatigue, however it is not as severe as when she was on bendamustine chemotherapy, and she is still independent of activities of daily living. She still has chronic low back pain, osteoarthritis and compression fracture. She takes chronic pain medication. She is not able to ambulate without the use of a walker or cane. She denies residual palpable lymph node swelling, fever, mucositis, nausea vomiting, neuropathy, abdominal pain, bowel swelling, low extremity weakness, bowel bladder incontinence. The rest of the 14 point review of system was negative.  Past Medical History  Diagnosis Date  . Chicken pox   . 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 Non-Hodgkin's lymphoma, Burkitt's     Jan. 2013  . Back pain     pt has compression fractures in back  . Wears glasses   . Wears dentures     upper and lower  . Diffuse large B cell lymphoma 04/13/2012    Past Surgical History  Procedure Date  . Appendectomy     age 9  . Tonsillectomy     age 18  . Mass biopsy  04/04/2012    Procedure: NECK MASS BIOPSY;  Surgeon: Drema Halon, MD;  Location: [facility];  Service: ENT;  Laterality: Right;    Current Outpatient Prescriptions  Medication Sig Dispense Refill  . acetaminophen (TYLENOL) 500 MG tablet Take 1,000 mg by mouth every morning.      Marland Kitchen alendronate (FOSAMAX) 70 MG tablet Take 70 mg by mouth every Sunday. Take with a full glass of water on an empty stomach.      . Calcium Carbonate (CALCIUM 600 PO) Take 630 mg by mouth daily.      . cephALEXin (KEFLEX) 500 MG capsule Take 1 capsule (500 mg total) by mouth 3 (three) times daily.  30 capsule  0  . cholecalciferol (VITAMIN D) 1000 UNITS tablet Take 1,000 Units by mouth daily.      . Diphenhyd-Hydrocort-Nystatin SUSP Swish and spit 15 mLs 4 (four) times daily as needed (Swish/gargle and spit as needed for mouth soreness).  500 mL  2  . doxycycline (VIBRA-TABS) 100 MG tablet Take 1 tablet (100 mg total) by mouth 2 (two) times daily.  20 tablet  0  . furosemide (LASIX) 20 MG tablet Take 1 tablet (20 mg total) by mouth daily.  30 tablet  3  . gabapentin (NEURONTIN) 100 MG capsule Take 1 capsule (100 mg total) by mouth 3 (three) times daily.  90 capsule  3  . lactose free nutrition (BOOST PLUS) LIQD Take 237 mLs by mouth 3 (three) times daily with meals.  10 Can  3  . lidocaine-prilocaine (EMLA) cream Apply topically as needed. Put it on portacath one hour before access.  30 g  2  . mirtazapine (REMERON) 7.5 MG tablet Take 7.5 mg by mouth at bedtime.      Marland Kitchen morphine (MSIR) 15 MG tablet Take 1 tablet (15 mg total) by mouth every morning.  45 tablet  0  . ondansetron (ZOFRAN) 8 MG tablet Take 1 tablet (8 mg total) by mouth 2 (two) times daily. Take two times a day starting the day after chemo for 3 days. Then take two times a day as needed for nausea or vomiting.  30 tablet  1  . OVER THE COUNTER MEDICATION Take 1,000 mg by mouth daily. Emergency vitamin c powder      . polyethylene glycol  (MIRALAX / GLYCOLAX) packet Take 17 g by mouth every morning.      . predniSONE (DELTASONE) 20 MG tablet Take 2 tablets (40 mg total) by mouth daily. Take on days 1-5 of chemotherapy.  10 tablet  5  . PRESCRIPTION MEDICATION Inject 2 mg into the vein once. vinCRIStine (ONCOVIN) 2 mg in sodium chloride 0.9 % 50 mL chemo infusion  2 mg  Once  06/26/2012  06/26/2012      Route: Intravenous      . PRESCRIPTION MEDICATION Inject 40 mg into the vein once. DOXOrubicin (ADRIAMYCIN) chemo injection 40 mg 30 mg/m2  1.32 m2 (Treatment Plan Actual)      . PRESCRIPTION MEDICATION Inject 600 mg into the vein once. cyclophosphamide (CYTOXAN) 600 mg in sodium chloride 0.9 % 250 mL chemo infusion 450 mg/m2  1.32 m2 (Treatment Plan Actual)      . PRESCRIPTION MEDICATION Inject 500 mg into the vein once. riTUXimab (RITUXAN) 500 mg in sodium chloride 0.9 % 250 mL chemo infusion 375 mg/m2  1.32 m2 (Treatment Plan Actual)  Once 06/26/2012    Route:  Intravenous      . prochlorperazine (COMPAZINE) 10 MG tablet Take 1 tablet (10 mg total) by mouth every 6 (six) hours as needed (Nausea or vomiting).  30 tablet  6    ALLERGIES:  is allergic to penicillins.  REVIEW OF SYSTEMS:  The rest of the 14-point review of system was negative.   Filed Vitals:   07/14/12 1423  BP: 118/62  Pulse: 95  Temp: 97 F (36.1 C)  Resp: 20   Wt Readings from Last 3 Encounters:  07/14/12 91 lb 4.8 oz (41.413 kg)  07/10/12 91 lb (41.277 kg)  07/04/12 84 lb 11.2 oz (38.42 kg)   ECOG Performance status: 1  PHYSICAL EXAMINATION:   General: Thin-appearing woman, in no acute distress. Eyes: no scleral icterus. ENT: There were no oropharyngeal lesions. Neck was without thyromegaly. Lymphatics: The right cervical level II node was no longer palpable.  Negative for supraclavicular, inguinal, axillary adenopathy. Respiratory: lungs were clear bilaterally without wheezing or crackles. Cardiovascular: Regular rate and rhythm, S1/S2, without  murmur, rub or gallop. There was no pedal edema. GI: abdomen was soft, flat, nontender, nondistended, without organomegaly. Muscoloskeletal: no spinal tenderness of palpation of vertebral spine. Skin exam was without echymosis, petichae. RLE with warmth and redness from the foot up to the mid-shin. No open areas or drainage noted. Neuro exam was nonfocal. Patient was able to get on and off exam table without assistance. Gait was measured.  She needed a cane to assister her ambulation. Patient was alerted and oriented. Attention was good. Language was appropriate. Mood was normal without  depression. Speech was not pressured. Thought content was not tangential.    LABORATORY/RADIOLOGY DATA:  Lab Results  Component Value Date   WBC 12.5* 07/14/2012   HGB 8.4* 07/14/2012   HCT 26.0* 07/14/2012   PLT 355 07/14/2012   GLUCOSE 119* 07/14/2012   ALKPHOS 137 07/14/2012   ALT 9 07/14/2012   AST 14 07/14/2012   NA 138 07/14/2012   K 3.3* 07/14/2012   CL 97* 07/14/2012   CREATININE 0.8 07/14/2012   BUN 14.0 07/14/2012   CO2 32* 07/14/2012   INR 0.88 04/18/2012     ASSESSMENT AND PLAN:  1. Diagnosis: Transformation of follicular lymphoma now to diffuse large B-cell lymphoma - She is s/p 4 cycles of dose reduced R-CHOP.   For cycle #3 the dose of Cytoxan and Adriamycin were increased to 60% from 50% of baseline. She has grade 1 anemia, grade 1 mucositis, grade 1 anorexia, grade 1 weight loss, and grade 2 fatigue. She has an ongoing infection and will plan to delay cycle #5 until infection has resolved. She understands and is in agreement with this plan.  2. Osteoarhritis: Due to compression fractures. She is on MSIR per Dr Caryl Never. She is also on Fosamax, calcium, and vitamin D.   3. Grade 1 anemia: most likely anemia of chemo.  There is no active bleeding. There is no indication for blood transfusion.   4.  Grade 1 anorexia and weight loss:  Due to chemotherapy. She is on oral boost supplements.  Recent  weight loss due to infection. Encouraged her to continue supplements and try to gain back lost weight while on break from chemo.   5.  Grade 1 mucositis, due to chemotherapy. She has Magic mouthwash as needed.  6. Cellulitis or RLE: On Doxycyline and Keflex per PCP. She has f/u with PCP on 07/19/12.  7. Follow up in 3 weeks and we will re-evaluate her for cycle 5 at that time.

## 2012-07-15 NOTE — Progress Notes (Signed)
Late entry 06/05/12.  Adriamycin IV push over . Start time 1014 end time 1029.

## 2012-07-17 ENCOUNTER — Ambulatory Visit: Payer: Medicare HMO

## 2012-07-18 ENCOUNTER — Ambulatory Visit: Payer: Medicare HMO

## 2012-07-19 ENCOUNTER — Ambulatory Visit (INDEPENDENT_AMBULATORY_CARE_PROVIDER_SITE_OTHER): Payer: Medicare HMO | Admitting: Family Medicine

## 2012-07-19 ENCOUNTER — Encounter: Payer: Self-pay | Admitting: Family Medicine

## 2012-07-19 VITALS — BP 110/70 | Temp 97.3°F | Wt 93.0 lb

## 2012-07-19 DIAGNOSIS — L03119 Cellulitis of unspecified part of limb: Secondary | ICD-10-CM

## 2012-07-19 DIAGNOSIS — L03115 Cellulitis of right lower limb: Secondary | ICD-10-CM

## 2012-07-19 MED ORDER — CEPHALEXIN 500 MG PO CAPS: 500.0000 mg | ORAL_CAPSULE | Freq: Three times a day (TID) | ORAL | Status: DC

## 2012-07-19 NOTE — Patient Instructions (Addendum)
Start back Keflex for any increased warmth, swelling, redness, or pain

## 2012-07-19 NOTE — Progress Notes (Signed)
  Subjective:    Patient ID: Colleen Cooke, female    DOB: [birth year], 78 y.o.   MRN: [id number]  HPI Followup cellulitis right leg. We added Keflex to her doxycycline last visit and she is seeing improvement. Less red. No warmth. Less tender. No fever or chills. Her chemotherapy has been placed on hold because of the cellulitis.  No nausea or vomiting. Ambulating without difficulty. Finish his Keflex tomorrow  Past Medical History  Diagnosis Date  . Chicken pox   . 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 Non-Hodgkin's lymphoma, Burkitt's     Jan. 2013  . Back pain     pt has compression fractures in back  . Wears glasses   . Wears dentures     upper and lower  . Diffuse large B cell lymphoma 04/13/2012   Past Surgical History  Procedure Date  . Appendectomy     age 76  . Tonsillectomy     age 77  . Mass biopsy 04/04/2012    Procedure: NECK MASS BIOPSY;  Surgeon: Drema Halon, MD;  Location: [facility];  Service: ENT;  Laterality: Right;    reports that she quit smoking about 18 years ago. Her smoking use included Cigarettes. She has a 20 pack-year smoking history. She has never used smokeless tobacco. She reports that she drinks about .6 ounces of alcohol per week. She reports that she does not use illicit drugs. family history includes Coronary artery disease (age of onset:71) in her mother. Allergies  Allergen Reactions  . Penicillins     REACTION: swelling localized  '    Review of Systems  Constitutional: Negative for fever and chills.  Respiratory: Negative for shortness of breath.   Gastrointestinal: Negative for nausea and vomiting.  Musculoskeletal: Negative for gait problem.       Objective:   Physical Exam  Constitutional: She appears well-developed and well-nourished.  Cardiovascular: Normal rate and regular rhythm.   Pulmonary/Chest: Effort normal and breath sounds normal. No respiratory distress. She has no wheezes. She has no rales.  Skin:        Right leg reveals much less erythema and no increased warmth. Much less tender than last visit. Still some faint erythema. Edema is not much change. No open ulcerations          Assessment & Plan:  Cellulitis right leg. Improved. Continue elevation and heat. Return for one more week of Keflex.

## 2012-08-04 ENCOUNTER — Ambulatory Visit (HOSPITAL_BASED_OUTPATIENT_CLINIC_OR_DEPARTMENT_OTHER): Payer: Medicare HMO | Admitting: Oncology

## 2012-08-04 ENCOUNTER — Telehealth: Payer: Self-pay | Admitting: *Deleted

## 2012-08-04 ENCOUNTER — Other Ambulatory Visit (HOSPITAL_BASED_OUTPATIENT_CLINIC_OR_DEPARTMENT_OTHER): Payer: Medicare HMO | Admitting: Lab

## 2012-08-04 ENCOUNTER — Telehealth: Payer: Self-pay | Admitting: Oncology

## 2012-08-04 VITALS — BP 111/65 | HR 89 | Temp 96.8°F | Resp 16 | Ht 60.5 in | Wt 91.3 lb

## 2012-08-04 DIAGNOSIS — C8589 Other specified types of non-Hodgkin lymphoma, extranodal and solid organ sites: Secondary | ICD-10-CM

## 2012-08-04 DIAGNOSIS — R634 Abnormal weight loss: Secondary | ICD-10-CM

## 2012-08-04 DIAGNOSIS — D649 Anemia, unspecified: Secondary | ICD-10-CM

## 2012-08-04 DIAGNOSIS — C8330 Diffuse large B-cell lymphoma, unspecified site: Secondary | ICD-10-CM

## 2012-08-04 LAB — CBC WITH DIFFERENTIAL/PLATELET
BASO%: 1.4 % (ref 0.0–2.0)
EOS%: 4 % (ref 0.0–7.0)
HCT: 30.5 % — ABNORMAL LOW (ref 34.8–46.6)
LYMPH%: 9.4 % — ABNORMAL LOW (ref 14.0–49.7)
MCH: 34.4 pg — ABNORMAL HIGH (ref 25.1–34.0)
MCHC: 33.5 g/dL (ref 31.5–36.0)
MCV: 102.7 fL — ABNORMAL HIGH (ref 79.5–101.0)
MONO%: 8.8 % (ref 0.0–14.0)
NEUT%: 76.4 % (ref 38.4–76.8)
Platelets: 244 10*3/uL (ref 145–400)
RBC: 2.97 10*6/uL — ABNORMAL LOW (ref 3.70–5.45)
WBC: 5.7 10*3/uL (ref 3.9–10.3)

## 2012-08-04 LAB — COMPREHENSIVE METABOLIC PANEL (CC13)
ALT: 10 U/L (ref 0–55)
Alkaline Phosphatase: 98 U/L (ref 40–150)
CO2: 29 mEq/L (ref 22–29)
Creatinine: 0.8 mg/dL (ref 0.6–1.1)
Sodium: 140 mEq/L (ref 136–145)
Total Bilirubin: 0.28 mg/dL (ref 0.20–1.20)
Total Protein: 6.2 g/dL — ABNORMAL LOW (ref 6.4–8.3)

## 2012-08-04 NOTE — Telephone Encounter (Signed)
gv and printed pt appt schedule for Feb and March...pt aware central scheduling will contact with d/t of pe

## 2012-08-04 NOTE — Telephone Encounter (Signed)
Per staff message and POF I have scheduled appts.  JMW  

## 2012-08-04 NOTE — Progress Notes (Signed)
[facility]  Telephone:([phone] Fax:(336) [phone]   OFFICE PROGRESS NOTE   Cc:  [name], MD  DIAGNOSIS: Transformed diffuse large B-cell lymphoma from follicular B-cell NonHodgkin's lymphoma; stage II.   PAST THERAPY: For history of stage IV, low grade but high risk follicular lymphoma: 3 cycles of Bendamustine/Rituxan between Jan 2013 and March 2013. She stopped chemo due to severe pain from compression fracture and severe bone pain.   CURRENT THERAPY: started mini-RCHOP 04/24/2012; goal for 6 cycles total.   INTERVAL HISTORY: Colleen Cooke 78 y.o. female returns for regular follow up for cycle #5 of chemo dose-reduced R-CHOP. Chemo has been on hold due to right lower extremity cellulitis.  She was given antibiotics by her PCP with significant resolution of the pain, erythema, increased warmth.  However, the skin has become slightly darker skin and edematous from lymphedema.  Swelling is much better now compared to when she had cellulitis.  She denied calf/thigh pain.  She has mild fatigue; however, she is able to work out on a bicycle at the gym for 30-92min 3x/week.  She denied palpable nodes, fever, mucositis, nausea/vomiting, abd pain, neuropathy, CP, PND, orthopnea.  She has normal appetite in her opinion but has not regained much weight.    Past Medical History  Diagnosis Date  . Chicken pox   . 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 Non-Hodgkin's lymphoma, Burkitt's     Jan. 2013  . Back pain     pt has compression fractures in back  . Wears glasses   . Wears dentures     upper and lower  . Diffuse large B cell lymphoma 04/13/2012    Past Surgical History  Procedure Date  . Appendectomy     age 35  . Tonsillectomy     age 41  . Mass biopsy 04/04/2012    Procedure: NECK MASS BIOPSY;  Surgeon: Drema Halon, MD;  Location: [facility];  Service: ENT;  Laterality: Right;    Current Outpatient Prescriptions  Medication  Sig Dispense Refill  . acetaminophen (TYLENOL) 500 MG tablet Take 1,000 mg by mouth every morning.      Marland Kitchen alendronate (FOSAMAX) 70 MG tablet Take 70 mg by mouth every Sunday. Take with a full glass of water on an empty stomach.      . Calcium Carbonate (CALCIUM 600 PO) Take 630 mg by mouth daily.      . cholecalciferol (VITAMIN D) 1000 UNITS tablet Take 1,000 Units by mouth daily.      . Diphenhyd-Hydrocort-Nystatin SUSP Swish and spit 15 mLs 4 (four) times daily as needed (Swish/gargle and spit as needed for mouth soreness).  500 mL  2  . furosemide (LASIX) 20 MG tablet Take 1 tablet (20 mg total) by mouth daily.  30 tablet  3  . gabapentin (NEURONTIN) 100 MG capsule Take 1 capsule (100 mg total) by mouth 3 (three) times daily.  90 capsule  3  . lactose free nutrition (BOOST PLUS) LIQD Take 237 mLs by mouth 3 (three) times daily with meals.  10 Can  3  . lidocaine-prilocaine (EMLA) cream Apply topically as needed. Put it on portacath one hour before access.  30 g  2  . mirtazapine (REMERON) 7.5 MG tablet Take 7.5 mg by mouth at bedtime.      Marland Kitchen morphine (MSIR) 15 MG tablet Take 1 tablet (15 mg total) by mouth every morning.  45 tablet  0  . ondansetron (ZOFRAN)  8 MG tablet Take 1 tablet (8 mg total) by mouth 2 (two) times daily. Take two times a day starting the day after chemo for 3 days. Then take two times a day as needed for nausea or vomiting.  30 tablet  1  . OVER THE COUNTER MEDICATION Take 1,000 mg by mouth daily. Emergency vitamin c powder      . polyethylene glycol (MIRALAX / GLYCOLAX) packet Take 17 g by mouth every morning.      . predniSONE (DELTASONE) 20 MG tablet Take 2 tablets (40 mg total) by mouth daily. Take on days 1-5 of chemotherapy.  10 tablet  5  . PRESCRIPTION MEDICATION Inject 2 mg into the vein once. vinCRIStine (ONCOVIN) 2 mg in sodium chloride 0.9 % 50 mL chemo infusion  2 mg  Once  06/26/2012  06/26/2012      Route: Intravenous      . PRESCRIPTION MEDICATION Inject 40  mg into the vein once. DOXOrubicin (ADRIAMYCIN) chemo injection 40 mg 30 mg/m2  1.32 m2 (Treatment Plan Actual)      . PRESCRIPTION MEDICATION Inject 600 mg into the vein once. cyclophosphamide (CYTOXAN) 600 mg in sodium chloride 0.9 % 250 mL chemo infusion 450 mg/m2  1.32 m2 (Treatment Plan Actual)      . PRESCRIPTION MEDICATION Inject 500 mg into the vein once. riTUXimab (RITUXAN) 500 mg in sodium chloride 0.9 % 250 mL chemo infusion 375 mg/m2  1.32 m2 (Treatment Plan Actual)  Once 06/26/2012    Route:  Intravenous      . prochlorperazine (COMPAZINE) 10 MG tablet Take 1 tablet (10 mg total) by mouth every 6 (six) hours as needed (Nausea or vomiting).  30 tablet  6    ALLERGIES:  is allergic to penicillins.  REVIEW OF SYSTEMS:  The rest of the 14-point review of system was negative.   Filed Vitals:   08/04/12 0953  BP: 111/65  Pulse: 89  Temp: 96.8 F (36 C)  Resp: 16   Wt Readings from Last 3 Encounters:  08/04/12 91 lb 4.8 oz (41.413 kg)  07/19/12 93 lb (42.185 kg)  07/14/12 91 lb 4.8 oz (41.413 kg)   ECOG Performance status: 1  PHYSICAL EXAMINATION:   General: Thin-appearing woman, in no acute distress. Eyes: no scleral icterus. ENT: There were no oropharyngeal lesions. Neck was without thyromegaly. Lymphatics: Negative for cervical, supraclavicular, inguinal, axillary adenopathy. Respiratory: lungs were clear bilaterally without wheezing or crackles. Cardiovascular: Regular rate and rhythm, S1/S2, without murmur, rub or gallop. There was no pedal edema. GI: abdomen was soft, flat, nontender, nondistended, without organomegaly. Muscoloskeletal: no spinal tenderness of palpation of vertebral spine. Skin exam was without echymosis, petichae. Neuro exam was nonfocal. Patient was able to get on and off exam table without assistance. Gait was measured.  She can ambulate now on her own. Patient was alerted and oriented. Attention was good. Language was appropriate. Mood was normal  without depression. Speech was not pressured. Thought content was not tangential.  There was increased in skin tone and nonpitting woody, SQ tissue in the right lateral lower calf above the right lateral malleolus.  There is no skin erythema, increased warmth or pain on palpation of the area.    LABORATORY/RADIOLOGY DATA:  Lab Results  Component Value Date   WBC 5.7 08/04/2012   HGB 10.2* 08/04/2012   HCT 30.5* 08/04/2012   PLT 244 08/04/2012   GLUCOSE 105* 08/04/2012   ALKPHOS 98 08/04/2012   ALT 10 08/04/2012  AST 20 08/04/2012   NA 140 08/04/2012   K 4.0 08/04/2012   CL 102 08/04/2012   CREATININE 0.8 08/04/2012   BUN 10.2 08/04/2012   CO2 29 08/04/2012   INR 0.88 04/18/2012     ASSESSMENT AND PLAN:  1. Diagnosis: Transformation of follicular lymphoma now to diffuse large B-cell lymphoma - She is s/p 4 cycles of dose reduced R-CHOP.   For cycle #3 the dose of Cytoxan and Adriamycin were increased to 60% from 50% of baseline. She has grade 1 anemia, grade 1 weight loss, and grade 1 fatigue. There is no indication for dose reduction. She recently had cellulitis but I do not deemed that to be dose-limiting toxicity.  She had cellulitis on her LE's before chemo.  I recomemended to proceed with the 5th out of 6 cycles with dosing the same as the 4th cycle.  She expressed informed understanding and wished to proceed as recommended.   2. Osteoarhritis: Due to compression fractures. She is on MSIR per Dr Caryl Never. She is also on Fosamax, calcium, and vitamin D.   3. Grade 1 anemia: most likely anemia of chemo.  There is no active bleeding. There is no indication for blood transfusion.   4.  Grade 1weight loss:  Due to chemotherapy. She is on oral boost supplements.   5. Follow up in 3 weeks before the 6th and final planned cycle of chemo.  I arranged for a follow up PET scan after the 6th cycle.

## 2012-08-07 ENCOUNTER — Ambulatory Visit (HOSPITAL_BASED_OUTPATIENT_CLINIC_OR_DEPARTMENT_OTHER): Payer: No Typology Code available for payment source

## 2012-08-07 VITALS — BP 94/52 | HR 88 | Temp 98.5°F | Resp 20

## 2012-08-07 DIAGNOSIS — Z5111 Encounter for antineoplastic chemotherapy: Secondary | ICD-10-CM

## 2012-08-07 DIAGNOSIS — C8589 Other specified types of non-Hodgkin lymphoma, extranodal and solid organ sites: Secondary | ICD-10-CM

## 2012-08-07 DIAGNOSIS — Z5112 Encounter for antineoplastic immunotherapy: Secondary | ICD-10-CM

## 2012-08-07 DIAGNOSIS — C8330 Diffuse large B-cell lymphoma, unspecified site: Secondary | ICD-10-CM

## 2012-08-07 MED ORDER — HEPARIN SOD (PORK) LOCK FLUSH 100 UNIT/ML IV SOLN
500.0000 [IU] | Freq: Once | INTRAVENOUS | Status: AC | PRN
  Administered 2012-08-07: 500 [IU]
  Filled 2012-08-07: qty 5

## 2012-08-07 MED ORDER — SODIUM CHLORIDE 0.9 % IV SOLN
Freq: Once | INTRAVENOUS | Status: AC
  Administered 2012-08-07: 10:00:00 via INTRAVENOUS

## 2012-08-07 MED ORDER — SODIUM CHLORIDE 0.9 % IJ SOLN
10.0000 mL | INTRAMUSCULAR | Status: DC | PRN
  Administered 2012-08-07: 10 mL
  Filled 2012-08-07: qty 10

## 2012-08-07 MED ORDER — DOXORUBICIN HCL CHEMO IV INJECTION 2 MG/ML
30.0000 mg/m2 | Freq: Once | INTRAVENOUS | Status: AC
  Administered 2012-08-07: 40 mg via INTRAVENOUS
  Filled 2012-08-07: qty 20

## 2012-08-07 MED ORDER — SODIUM CHLORIDE 0.9 % IV SOLN
450.0000 mg/m2 | Freq: Once | INTRAVENOUS | Status: AC
  Administered 2012-08-07: 600 mg via INTRAVENOUS
  Filled 2012-08-07: qty 30

## 2012-08-07 MED ORDER — SODIUM CHLORIDE 0.9 % IV SOLN
375.0000 mg/m2 | Freq: Once | INTRAVENOUS | Status: AC
  Administered 2012-08-07: 500 mg via INTRAVENOUS
  Filled 2012-08-07: qty 50

## 2012-08-07 MED ORDER — ACETAMINOPHEN 325 MG PO TABS
650.0000 mg | ORAL_TABLET | Freq: Once | ORAL | Status: AC
Start: 2012-08-07 — End: 2012-08-07
  Administered 2012-08-07: 650 mg via ORAL

## 2012-08-07 MED ORDER — DEXAMETHASONE SODIUM PHOSPHATE 4 MG/ML IJ SOLN
20.0000 mg | Freq: Once | INTRAMUSCULAR | Status: AC
  Administered 2012-08-07: 20 mg via INTRAVENOUS

## 2012-08-07 MED ORDER — DIPHENHYDRAMINE HCL 25 MG PO CAPS
50.0000 mg | ORAL_CAPSULE | Freq: Once | ORAL | Status: AC
  Administered 2012-08-07: 50 mg via ORAL

## 2012-08-07 MED ORDER — ONDANSETRON 16 MG/50ML IVPB (CHCC)
16.0000 mg | Freq: Once | INTRAVENOUS | Status: AC
Start: 2012-08-07 — End: 2012-08-07
  Administered 2012-08-07: 16 mg via INTRAVENOUS

## 2012-08-07 MED ORDER — VINCRISTINE SULFATE CHEMO INJECTION 1 MG/ML
2.0000 mg | Freq: Once | INTRAVENOUS | Status: AC
  Administered 2012-08-07: 2 mg via INTRAVENOUS
  Filled 2012-08-07: qty 2

## 2012-08-07 NOTE — Progress Notes (Signed)
New consent signed for Rituxan

## 2012-08-07 NOTE — Patient Instructions (Signed)
Patient discharged ambulatory with no complaints.

## 2012-08-08 ENCOUNTER — Ambulatory Visit (HOSPITAL_BASED_OUTPATIENT_CLINIC_OR_DEPARTMENT_OTHER): Payer: Medicare HMO

## 2012-08-08 VITALS — BP 123/44 | HR 100 | Temp 97.4°F

## 2012-08-08 DIAGNOSIS — C8589 Other specified types of non-Hodgkin lymphoma, extranodal and solid organ sites: Secondary | ICD-10-CM

## 2012-08-08 DIAGNOSIS — C8330 Diffuse large B-cell lymphoma, unspecified site: Secondary | ICD-10-CM

## 2012-08-08 MED ORDER — PEGFILGRASTIM INJECTION 6 MG/0.6ML
6.0000 mg | Freq: Once | SUBCUTANEOUS | Status: AC
  Administered 2012-08-08: 6 mg via SUBCUTANEOUS
  Filled 2012-08-08: qty 0.6

## 2012-08-28 ENCOUNTER — Telehealth: Payer: Self-pay | Admitting: *Deleted

## 2012-08-28 ENCOUNTER — Telehealth: Payer: Self-pay | Admitting: Oncology

## 2012-08-28 ENCOUNTER — Ambulatory Visit: Payer: Medicare HMO

## 2012-08-28 ENCOUNTER — Other Ambulatory Visit: Payer: Medicare HMO | Admitting: Lab

## 2012-08-28 ENCOUNTER — Ambulatory Visit: Payer: Medicare HMO | Admitting: Oncology

## 2012-08-28 NOTE — Telephone Encounter (Signed)
Per staff phone call  I have schedueld appts.  JMW

## 2012-08-28 NOTE — Telephone Encounter (Signed)
Colleen Cooke has already rescheduled her lab/visit/chemo for Monday 3/10.

## 2012-08-28 NOTE — Telephone Encounter (Signed)
returned pt call and she wanted to r/s to nxt monday.Marland KitchenMarland KitchenDone.Marland Kitchen

## 2012-08-28 NOTE — Telephone Encounter (Signed)
Pt called to cancel her office visit and chemo today due to inclement weather this afternoon.  Will also need to cancel Neulasta for tomorrow. Asks to be r/s this week if possible.  She believes the weather is supposed to be better tomorrow.

## 2012-08-29 ENCOUNTER — Ambulatory Visit: Payer: Medicare HMO

## 2012-09-02 ENCOUNTER — Other Ambulatory Visit: Payer: Self-pay | Admitting: Oncology

## 2012-09-02 DIAGNOSIS — C8330 Diffuse large B-cell lymphoma, unspecified site: Secondary | ICD-10-CM

## 2012-09-04 ENCOUNTER — Other Ambulatory Visit: Payer: Self-pay | Admitting: Oncology

## 2012-09-04 ENCOUNTER — Ambulatory Visit (HOSPITAL_BASED_OUTPATIENT_CLINIC_OR_DEPARTMENT_OTHER): Payer: Medicare HMO

## 2012-09-04 ENCOUNTER — Ambulatory Visit (HOSPITAL_BASED_OUTPATIENT_CLINIC_OR_DEPARTMENT_OTHER): Payer: Medicare HMO | Admitting: Oncology

## 2012-09-04 ENCOUNTER — Other Ambulatory Visit: Payer: Medicare HMO | Admitting: Lab

## 2012-09-04 ENCOUNTER — Encounter: Payer: Self-pay | Admitting: Oncology

## 2012-09-04 VITALS — BP 109/62 | HR 101 | Temp 97.2°F | Resp 18

## 2012-09-04 VITALS — BP 130/70 | HR 86 | Temp 96.7°F | Resp 18 | Ht 60.5 in | Wt 90.5 lb

## 2012-09-04 DIAGNOSIS — Z5111 Encounter for antineoplastic chemotherapy: Secondary | ICD-10-CM

## 2012-09-04 DIAGNOSIS — R634 Abnormal weight loss: Secondary | ICD-10-CM

## 2012-09-04 DIAGNOSIS — C8330 Diffuse large B-cell lymphoma, unspecified site: Secondary | ICD-10-CM

## 2012-09-04 DIAGNOSIS — C8589 Other specified types of non-Hodgkin lymphoma, extranodal and solid organ sites: Secondary | ICD-10-CM

## 2012-09-04 DIAGNOSIS — M1990 Unspecified osteoarthritis, unspecified site: Secondary | ICD-10-CM

## 2012-09-04 DIAGNOSIS — D649 Anemia, unspecified: Secondary | ICD-10-CM

## 2012-09-04 DIAGNOSIS — Z5112 Encounter for antineoplastic immunotherapy: Secondary | ICD-10-CM

## 2012-09-04 LAB — CBC WITH DIFFERENTIAL/PLATELET
BASO%: 1 % (ref 0.0–2.0)
Eosinophils Absolute: 0.1 10*3/uL (ref 0.0–0.5)
HCT: 31.5 % — ABNORMAL LOW (ref 34.8–46.6)
LYMPH%: 10.7 % — ABNORMAL LOW (ref 14.0–49.7)
MCHC: 32.7 g/dL (ref 31.5–36.0)
MCV: 101 fL (ref 79.5–101.0)
MONO#: 0.8 10*3/uL (ref 0.1–0.9)
MONO%: 9.1 % (ref 0.0–14.0)
NEUT%: 77.5 % — ABNORMAL HIGH (ref 38.4–76.8)
Platelets: 260 10*3/uL (ref 145–400)
WBC: 8.4 10*3/uL (ref 3.9–10.3)

## 2012-09-04 LAB — COMPREHENSIVE METABOLIC PANEL (CC13)
BUN: 8.7 mg/dL (ref 7.0–26.0)
CO2: 25 mEq/L (ref 22–29)
Calcium: 9.4 mg/dL (ref 8.4–10.4)
Chloride: 103 mEq/L (ref 98–107)
Creatinine: 0.7 mg/dL (ref 0.6–1.1)
Total Bilirubin: 0.32 mg/dL (ref 0.20–1.20)

## 2012-09-04 MED ORDER — SODIUM CHLORIDE 0.9 % IV SOLN
450.0000 mg/m2 | Freq: Once | INTRAVENOUS | Status: AC
  Administered 2012-09-04: 600 mg via INTRAVENOUS
  Filled 2012-09-04: qty 30

## 2012-09-04 MED ORDER — ACETAMINOPHEN 325 MG PO TABS
650.0000 mg | ORAL_TABLET | Freq: Once | ORAL | Status: AC
  Administered 2012-09-04: 650 mg via ORAL

## 2012-09-04 MED ORDER — ONDANSETRON 16 MG/50ML IVPB (CHCC)
16.0000 mg | Freq: Once | INTRAVENOUS | Status: AC
  Administered 2012-09-04: 16 mg via INTRAVENOUS

## 2012-09-04 MED ORDER — VINCRISTINE SULFATE CHEMO INJECTION 1 MG/ML
2.0000 mg | Freq: Once | INTRAVENOUS | Status: AC
  Administered 2012-09-04: 2 mg via INTRAVENOUS
  Filled 2012-09-04: qty 2

## 2012-09-04 MED ORDER — SODIUM CHLORIDE 0.9 % IV SOLN
Freq: Once | INTRAVENOUS | Status: AC
  Administered 2012-09-04: 11:00:00 via INTRAVENOUS

## 2012-09-04 MED ORDER — SODIUM CHLORIDE 0.9 % IV SOLN
375.0000 mg/m2 | Freq: Once | INTRAVENOUS | Status: AC
  Administered 2012-09-04: 500 mg via INTRAVENOUS
  Filled 2012-09-04: qty 50

## 2012-09-04 MED ORDER — HEPARIN SOD (PORK) LOCK FLUSH 100 UNIT/ML IV SOLN
500.0000 [IU] | Freq: Once | INTRAVENOUS | Status: AC | PRN
  Administered 2012-09-04: 500 [IU]
  Filled 2012-09-04: qty 5

## 2012-09-04 MED ORDER — SODIUM CHLORIDE 0.9 % IJ SOLN
10.0000 mL | INTRAMUSCULAR | Status: DC | PRN
  Administered 2012-09-04: 10 mL
  Filled 2012-09-04: qty 10

## 2012-09-04 MED ORDER — DEXAMETHASONE SODIUM PHOSPHATE 4 MG/ML IJ SOLN
20.0000 mg | Freq: Once | INTRAMUSCULAR | Status: AC
  Administered 2012-09-04: 20 mg via INTRAVENOUS

## 2012-09-04 MED ORDER — DIPHENHYDRAMINE HCL 25 MG PO CAPS
50.0000 mg | ORAL_CAPSULE | Freq: Once | ORAL | Status: AC
  Administered 2012-09-04: 50 mg via ORAL

## 2012-09-04 MED ORDER — DOXORUBICIN HCL CHEMO IV INJECTION 2 MG/ML
30.0000 mg/m2 | Freq: Once | INTRAVENOUS | Status: AC
  Administered 2012-09-04: 40 mg via INTRAVENOUS
  Filled 2012-09-04: qty 20

## 2012-09-04 NOTE — Patient Instructions (Addendum)
[facility] Discharge Instructions for Patients Receiving Chemotherapy  Today you received the following chemotherapy agents Rituxan, Adriamycin, Cytoxan and Vincristine.  To help prevent nausea and vomiting after your treatment, we encourage you to take your nausea medication as directed.   If you develop nausea and vomiting that is not controlled by your nausea medication, call the clinic. If it is after clinic hours your family physician or the after hours number for the clinic or go to the Emergency Department.   BELOW ARE SYMPTOMS THAT SHOULD BE REPORTED IMMEDIATELY:  *FEVER GREATER THAN 100.5 F  *CHILLS WITH OR WITHOUT FEVER  NAUSEA AND VOMITING THAT IS NOT CONTROLLED WITH YOUR NAUSEA MEDICATION  *UNUSUAL SHORTNESS OF BREATH  *UNUSUAL BRUISING OR BLEEDING  TENDERNESS IN MOUTH AND THROAT WITH OR WITHOUT PRESENCE OF ULCERS  *URINARY PROBLEMS  *BOWEL PROBLEMS  UNUSUAL RASH Items with * indicate a potential emergency and should be followed up as soon as possible.  Feel free to call the clinic you have any questions or concerns. The clinic phone number is [phone].

## 2012-09-04 NOTE — Progress Notes (Signed)
[facility]  Telephone:(336) [phone] Fax:(336) [phone]   OFFICE PROGRESS NOTE   Cc:  [name], MD  DIAGNOSIS: Transformed diffuse large B-cell lymphoma from follicular B-cell NonHodgkin's lymphoma; stage II.   PAST THERAPY: For history of stage IV, low grade but high risk follicular lymphoma: 3 cycles of Bendamustine/Rituxan between Jan 2013 and March 2013. She stopped chemo due to severe pain from compression fracture and severe bone pain.   CURRENT THERAPY: started mini-RCHOP 04/24/2012; goal for 6 cycles total.   INTERVAL HISTORY: Colleen Cooke 78 y.o. female returns for regular follow up for cycle #6 of chemo dose-reduced R-CHOP. She denied calf/thigh pain.  She has mild fatigue; however, she is able to work out on a bicycle at the gym for 30-15min 3x/week.  She denied palpable nodes, fever, mucositis, nausea/vomiting, abd pain, neuropathy, CP, PND, orthopnea.  She has normal appetite in her opinion but has not regained much weight. States that she stopped her MSIR as it was not really helping. Has adequate pain relief with Tylenol once a day.   Past Medical History  Diagnosis Date  . Chicken pox   . 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 Non-Hodgkin's lymphoma, Burkitt's     Jan. 2013  . Back pain     pt has compression fractures in back  . Wears glasses   . Wears dentures     upper and lower  . Diffuse large B cell lymphoma 04/13/2012    Past Surgical History  Procedure Laterality Date  . Appendectomy      age 81  . Tonsillectomy      age 65  . Mass biopsy  04/04/2012    Procedure: NECK MASS BIOPSY;  Surgeon: Drema Halon, MD;  Location: [facility];  Service: ENT;  Laterality: Right;    Current Outpatient Prescriptions  Medication Sig Dispense Refill  . acetaminophen (TYLENOL) 500 MG tablet Take 1,000 mg by mouth every morning.      Marland Kitchen alendronate (FOSAMAX) 70 MG tablet Take 70 mg by mouth every Sunday. Take with a full  glass of water on an empty stomach.      . Calcium Carbonate (CALCIUM 600 PO) Take 630 mg by mouth daily.      . cholecalciferol (VITAMIN D) 1000 UNITS tablet Take 1,000 Units by mouth daily.      . Diphenhyd-Hydrocort-Nystatin SUSP Swish and spit 15 mLs 4 (four) times daily as needed (Swish/gargle and spit as needed for mouth soreness).  500 mL  2  . furosemide (LASIX) 20 MG tablet Take 1 tablet (20 mg total) by mouth daily.  30 tablet  3  . gabapentin (NEURONTIN) 100 MG capsule Take 100 mg by mouth 2 (two) times daily.      Marland Kitchen lactose free nutrition (BOOST PLUS) LIQD Take 237 mLs by mouth 3 (three) times daily with meals.  10 Can  3  . lidocaine-prilocaine (EMLA) cream Apply topically as needed. Put it on portacath one hour before access.  30 g  2  . ondansetron (ZOFRAN) 8 MG tablet Take 1 tablet (8 mg total) by mouth 2 (two) times daily. Take two times a day starting the day after chemo for 3 days. Then take two times a day as needed for nausea or vomiting.  30 tablet  1  . OVER THE COUNTER MEDICATION Take 1,000 mg by mouth daily. Emergency vitamin c powder      . polyethylene glycol (MIRALAX / GLYCOLAX) packet Take  17 g by mouth every morning.      . predniSONE (DELTASONE) 20 MG tablet Take 2 tablets (40 mg total) by mouth daily. Take on days 1-5 of chemotherapy.  10 tablet  5  . PRESCRIPTION MEDICATION Inject 2 mg into the vein once. vinCRIStine (ONCOVIN) 2 mg in sodium chloride 0.9 % 50 mL chemo infusion  2 mg  Once  06/26/2012  06/26/2012      Route: Intravenous      . PRESCRIPTION MEDICATION Inject 40 mg into the vein once. DOXOrubicin (ADRIAMYCIN) chemo injection 40 mg 30 mg/m2  1.32 m2 (Treatment Plan Actual)      . PRESCRIPTION MEDICATION Inject 600 mg into the vein once. cyclophosphamide (CYTOXAN) 600 mg in sodium chloride 0.9 % 250 mL chemo infusion 450 mg/m2  1.32 m2 (Treatment Plan Actual)      . PRESCRIPTION MEDICATION Inject 500 mg into the vein once. riTUXimab (RITUXAN) 500 mg in  sodium chloride 0.9 % 250 mL chemo infusion 375 mg/m2  1.32 m2 (Treatment Plan Actual)  Once 06/26/2012    Route:  Intravenous      . prochlorperazine (COMPAZINE) 10 MG tablet Take 1 tablet (10 mg total) by mouth every 6 (six) hours as needed (Nausea or vomiting).  30 tablet  6   No current facility-administered medications for this visit.   Facility-Administered Medications Ordered in Other Visits  Medication Dose Route Frequency Provider Last Rate Last Dose  . sodium chloride 0.9 % injection 10 mL  10 mL Intracatheter PRN Exie Parody, MD   10 mL at 09/04/12 1547    ALLERGIES:  is allergic to penicillins.  REVIEW OF SYSTEMS:  The rest of the 14-point review of system was negative.   Filed Vitals:   09/04/12 0851  BP: 130/70  Pulse: 86  Temp: 96.7 F (35.9 C)  Resp: 18   Wt Readings from Last 3 Encounters:  09/04/12 90 lb 8 oz (41.051 kg)  08/04/12 91 lb 4.8 oz (41.413 kg)  07/19/12 93 lb (42.185 kg)   ECOG Performance status: 1  PHYSICAL EXAMINATION:   General: Thin-appearing woman, in no acute distress. Eyes: no scleral icterus. ENT: There were no oropharyngeal lesions. Neck was without thyromegaly. Lymphatics: Negative for cervical, supraclavicular, inguinal, axillary adenopathy. Respiratory: lungs were clear bilaterally without wheezing or crackles. Cardiovascular: Regular rate and rhythm, S1/S2, without murmur, rub or gallop. There was no pedal edema. GI: abdomen was soft, flat, nontender, nondistended, without organomegaly. Muscoloskeletal: no spinal tenderness of palpation of vertebral spine. Skin exam was without echymosis, petichae. Neuro exam was nonfocal. Patient was able to get on and off exam table without assistance. Gait was measured.  She can ambulate now on her own. Patient was alerted and oriented. Attention was good. Language was appropriate. Mood was normal without depression. Speech was not pressured. Thought content was not tangential.  There was increased in skin  tone and nonpitting woody, SQ tissue in the right lateral lower calf above the right lateral malleolus.  There is no skin erythema, increased warmth or pain on palpation of the area.    LABORATORY/RADIOLOGY DATA:  Lab Results  Component Value Date   WBC 8.4 09/04/2012   HGB 10.3* 09/04/2012   HCT 31.5* 09/04/2012   PLT 260 09/04/2012   GLUCOSE 90 09/04/2012   ALKPHOS 92 09/04/2012   ALT 16 09/04/2012   AST 25 09/04/2012   NA 138 09/04/2012   K 4.0 09/04/2012   CL 103 09/04/2012   CREATININE  0.7 09/04/2012   BUN 8.7 09/04/2012   CO2 25 09/04/2012   INR 0.88 04/18/2012     ASSESSMENT AND PLAN:  1. Diagnosis: Transformation of follicular lymphoma now to diffuse large B-cell lymphoma - She is s/p 5 cycles of dose reduced R-CHOP.   For cycle #3 the dose of Cytoxan and Adriamycin were increased to 60% from 50% of baseline. She has grade 1 anemia, grade 1 weight loss, and grade 1 fatigue. There is no indication for dose reduction. I recommended proceeding with cycle 6 today (same dose as the 5th cycle).  She expressed informed understanding and wished to proceed as recommended.   2. Osteoarhritis: Due to compression fractures. Using Tylenol and does not want to take MSIR. She is also on Fosamax, calcium, and vitamin D.   3. Grade 1 anemia: most likely anemia of chemo.  There is no active bleeding. There is no indication for blood transfusion.   4.  Grade 1weight loss:  Due to chemotherapy. She is on oral boost supplements.   5. Follow up at the end of this month as already scheduled. PET scan is scheduled prior to visit.

## 2012-09-05 ENCOUNTER — Ambulatory Visit (HOSPITAL_BASED_OUTPATIENT_CLINIC_OR_DEPARTMENT_OTHER): Payer: Medicare HMO

## 2012-09-05 ENCOUNTER — Telehealth: Payer: Self-pay | Admitting: Family Medicine

## 2012-09-05 ENCOUNTER — Ambulatory Visit: Payer: Medicare HMO | Admitting: Family Medicine

## 2012-09-05 VITALS — BP 111/33 | HR 99 | Temp 97.5°F

## 2012-09-05 DIAGNOSIS — C8330 Diffuse large B-cell lymphoma, unspecified site: Secondary | ICD-10-CM

## 2012-09-05 DIAGNOSIS — C8589 Other specified types of non-Hodgkin lymphoma, extranodal and solid organ sites: Secondary | ICD-10-CM

## 2012-09-05 MED ORDER — ALENDRONATE SODIUM 70 MG PO TABS: 70.0000 mg | ORAL_TABLET | ORAL | Status: DC

## 2012-09-05 MED ORDER — PEGFILGRASTIM INJECTION 6 MG/0.6ML
6.0000 mg | Freq: Once | SUBCUTANEOUS | Status: AC
  Administered 2012-09-05: 6 mg via SUBCUTANEOUS
  Filled 2012-09-05: qty 0.6

## 2012-09-05 NOTE — Telephone Encounter (Signed)
Pt would like refill of alendronate (FOSAMAX) 70 MG tablet.  Sent to Pharm: [facility] (from now on)

## 2012-09-13 ENCOUNTER — Telehealth: Payer: Self-pay | Admitting: Family Medicine

## 2012-09-13 NOTE — Telephone Encounter (Signed)
Pt is requesting a new rx morphine 15 mg #30 for her back fractures. Pt stated Md has prescribed med in past

## 2012-09-13 NOTE — Telephone Encounter (Signed)
Our records show:  Morphine last filled by Manson Passey MD, #45 with 0 refills on 07-05-12 "                                  Dr Gaylyn Rong, #90 with 0 refills on 04-14-12 "                                  Dr Caryl Never, #30 with 0 refills on 03-24-12

## 2012-09-13 NOTE — Telephone Encounter (Signed)
Pt cannot get this from more than one provider.  I did not see if she has signed controlled med contract. We will need to have her sign contract and f/u to discuss who will be filling this medication. Hopefully, she can start tapering back.

## 2012-09-14 NOTE — Telephone Encounter (Signed)
Pt informed.  She see's Dr Gaylyn Rong on Monday, 3/24 for a final visit and I asked her to schedule OV with Dr Caryl Never after that appt.  FYI

## 2012-09-15 ENCOUNTER — Encounter (HOSPITAL_COMMUNITY)
Admission: RE | Admit: 2012-09-15 | Discharge: 2012-09-15 | Disposition: A | Discharge status: Home / Self Care | Payer: Medicare HMO | Source: Ambulatory Visit | Attending: Oncology | Admitting: Oncology

## 2012-09-15 ENCOUNTER — Encounter (HOSPITAL_COMMUNITY): Payer: Self-pay

## 2012-09-15 DIAGNOSIS — C8330 Diffuse large B-cell lymphoma, unspecified site: Secondary | ICD-10-CM

## 2012-09-15 DIAGNOSIS — J438 Other emphysema: Secondary | ICD-10-CM | POA: Insufficient documentation

## 2012-09-15 DIAGNOSIS — I708 Atherosclerosis of other arteries: Secondary | ICD-10-CM | POA: Insufficient documentation

## 2012-09-15 DIAGNOSIS — I700 Atherosclerosis of aorta: Secondary | ICD-10-CM | POA: Insufficient documentation

## 2012-09-15 DIAGNOSIS — X58XXXA Exposure to other specified factors, initial encounter: Secondary | ICD-10-CM | POA: Insufficient documentation

## 2012-09-15 DIAGNOSIS — S32009A Unspecified fracture of unspecified lumbar vertebra, initial encounter for closed fracture: Secondary | ICD-10-CM | POA: Insufficient documentation

## 2012-09-15 DIAGNOSIS — S22009A Unspecified fracture of unspecified thoracic vertebra, initial encounter for closed fracture: Secondary | ICD-10-CM | POA: Insufficient documentation

## 2012-09-15 DIAGNOSIS — C8589 Other specified types of non-Hodgkin lymphoma, extranodal and solid organ sites: Secondary | ICD-10-CM | POA: Insufficient documentation

## 2012-09-15 LAB — GLUCOSE, CAPILLARY: Glucose-Capillary: 101 mg/dL — ABNORMAL HIGH (ref 70–99)

## 2012-09-15 MED ORDER — FLUDEOXYGLUCOSE F - 18 (FDG) INJECTION
19.1000 | Freq: Once | INTRAVENOUS | Status: AC | PRN
  Administered 2012-09-15: 19.1 via INTRAVENOUS

## 2012-09-17 NOTE — Patient Instructions (Addendum)
1.  History of transformed follicular lymphoma to diffuse large cell lymphoma. 2.  Treatment:  6 cycles of R-CHOP with complete response. 3.  Treatment option:     *  Consideration of starting Rixutan IV every 2 months x 2 year maintenance therapy.  *  Observation with repeat scan in about 6 months.

## 2012-09-18 ENCOUNTER — Other Ambulatory Visit (HOSPITAL_BASED_OUTPATIENT_CLINIC_OR_DEPARTMENT_OTHER): Payer: Medicare HMO | Admitting: Lab

## 2012-09-18 ENCOUNTER — Telehealth: Payer: Self-pay | Admitting: Oncology

## 2012-09-18 ENCOUNTER — Ambulatory Visit (HOSPITAL_BASED_OUTPATIENT_CLINIC_OR_DEPARTMENT_OTHER): Payer: Medicare HMO | Admitting: Oncology

## 2012-09-18 ENCOUNTER — Telehealth: Payer: Self-pay | Admitting: *Deleted

## 2012-09-18 VITALS — BP 121/68 | HR 85 | Temp 97.0°F | Resp 18 | Ht 60.5 in | Wt 91.5 lb

## 2012-09-18 DIAGNOSIS — D649 Anemia, unspecified: Secondary | ICD-10-CM

## 2012-09-18 DIAGNOSIS — C8330 Diffuse large B-cell lymphoma, unspecified site: Secondary | ICD-10-CM

## 2012-09-18 DIAGNOSIS — C8589 Other specified types of non-Hodgkin lymphoma, extranodal and solid organ sites: Secondary | ICD-10-CM

## 2012-09-18 LAB — CBC WITH DIFFERENTIAL/PLATELET
BASO%: 0.9 % (ref 0.0–2.0)
EOS%: 1.3 % (ref 0.0–7.0)
MCH: 35.1 pg — ABNORMAL HIGH (ref 25.1–34.0)
MCHC: 34.1 g/dL (ref 31.5–36.0)
RBC: 2.84 10*6/uL — ABNORMAL LOW (ref 3.70–5.45)
RDW: 15.8 % — ABNORMAL HIGH (ref 11.2–14.5)
lymph#: 0.6 10*3/uL — ABNORMAL LOW (ref 0.9–3.3)

## 2012-09-18 LAB — COMPREHENSIVE METABOLIC PANEL (CC13)
ALT: 18 U/L (ref 0–55)
AST: 21 U/L (ref 5–34)
Albumin: 3.4 g/dL — ABNORMAL LOW (ref 3.5–5.0)
Calcium: 10 mg/dL (ref 8.4–10.4)
Chloride: 103 mEq/L (ref 98–107)
Creatinine: 0.7 mg/dL (ref 0.6–1.1)
Potassium: 3.7 mEq/L (ref 3.5–5.1)
Sodium: 140 mEq/L (ref 136–145)

## 2012-09-18 NOTE — Telephone Encounter (Signed)
Per staff phone call and POF I have schedueld appts.  JMW  

## 2012-09-18 NOTE — Progress Notes (Signed)
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   PAST THERAPY: For history of stage IV, low grade but high risk follicular lymphoma: 3 cycles of Bendamustine/Rituxan between Jan 2013 and March 2013. She stopped chemo due to severe pain from compression fracture and severe bone pain.  She started mini-RCHOP 04/24/2012 for transformed NHL.   CURRENT THERAPY: Watchful observation.  INTERVAL HISTORY: Colleen Cooke 78 y.o. female returns for regular follow up with her husband. She reports that she is feeling better being off of chemotherapy.  Her taste is coming back. Her weight is coming up. Her strength is also improving. She is independent of all activities of daily living. She has chronic low back pain from osteoarthritis. She requested pain medication from me. She denies lower extremity weakness, paresthesia, bowel bladder incontinence. She used to have insomnia being on chemotherapy which resolved with mirtazapine. She requested this again. She denies fever, palpable lymph node swelling, drenching night sweats, mucositis, PND, orthopnea, pedal edema, chest pain, palpitation, abdominal pain, abdominal swelling, visible bleeding symptoms, skin rash, neuropathy. The rest of the 14 point review of system was negative.   Past Medical History  Diagnosis Date  . Chicken pox   . 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 Non-Hodgkin's lymphoma, Burkitt's     Jan. 2013  . Back pain     pt has compression fractures in back  . Wears glasses   . Wears dentures     upper and lower  . Diffuse large B cell lymphoma 04/13/2012    Past Surgical History  Procedure Laterality Date  . Appendectomy      age 68  . Tonsillectomy      age 80  . Mass biopsy  04/04/2012    Procedure: NECK MASS BIOPSY;  Surgeon:  Drema Halon, MD;  Location: [facility];  Service: ENT;  Laterality: Right;    Current Outpatient Prescriptions  Medication Sig Dispense Refill  . acetaminophen (TYLENOL) 500 MG tablet Take 1,000 mg by mouth every morning.      Marland Kitchen alendronate (FOSAMAX) 70 MG tablet Take 1 tablet (70 mg total) by mouth every Sunday. Take with a full glass of water on an empty stomach.  12 tablet  3  . Calcium Carbonate (CALCIUM 600 PO) Take 630 mg by mouth daily.      . cholecalciferol (VITAMIN D) 1000 UNITS tablet Take 1,000 Units by mouth daily.      . Diphenhyd-Hydrocort-Nystatin SUSP Swish and spit 15 mLs 4 (four) times daily as needed (Swish/gargle and spit as needed for mouth soreness).  500 mL  2  . furosemide (LASIX) 20 MG tablet Take 1 tablet (20 mg total) by mouth daily.  30 tablet  3  . gabapentin (NEURONTIN) 100 MG capsule Take 100 mg by mouth 2 (two) times daily.      Marland Kitchen lactose free nutrition (BOOST PLUS) LIQD Take 237 mLs by mouth 3 (three) times daily with meals.  10 Can  3  . lidocaine-prilocaine (EMLA) cream Apply topically as needed. Put it on portacath one hour before access.  30 g  2  . ondansetron (ZOFRAN) 8 MG tablet Take 1 tablet (8 mg total) by mouth 2 (two) times daily. Take two times a day starting the day after chemo for 3 days. Then take two  times a day as needed for nausea or vomiting.  30 tablet  1  . OVER THE COUNTER MEDICATION Take 1,000 mg by mouth daily. Emergency vitamin c powder      . polyethylene glycol (MIRALAX / GLYCOLAX) packet Take 17 g by mouth every morning.      . predniSONE (DELTASONE) 20 MG tablet Take 2 tablets (40 mg total) by mouth daily. Take on days 1-5 of chemotherapy.  10 tablet  5  . PRESCRIPTION MEDICATION Inject 2 mg into the vein once. vinCRIStine (ONCOVIN) 2 mg in sodium chloride 0.9 % 50 mL chemo infusion  2 mg  Once  06/26/2012  06/26/2012      Route: Intravenous      . PRESCRIPTION MEDICATION Inject 40 mg into the vein once.  DOXOrubicin (ADRIAMYCIN) chemo injection 40 mg 30 mg/m2  1.32 m2 (Treatment Plan Actual)      . PRESCRIPTION MEDICATION Inject 600 mg into the vein once. cyclophosphamide (CYTOXAN) 600 mg in sodium chloride 0.9 % 250 mL chemo infusion 450 mg/m2  1.32 m2 (Treatment Plan Actual)      . PRESCRIPTION MEDICATION Inject 500 mg into the vein once. riTUXimab (RITUXAN) 500 mg in sodium chloride 0.9 % 250 mL chemo infusion 375 mg/m2  1.32 m2 (Treatment Plan Actual)  Once 06/26/2012    Route:  Intravenous      . prochlorperazine (COMPAZINE) 10 MG tablet Take 1 tablet (10 mg total) by mouth every 6 (six) hours as needed (Nausea or vomiting).  30 tablet  6   No current facility-administered medications for this visit.    ALLERGIES:  is allergic to penicillins.   Filed Vitals:   09/18/12 0937  BP: 121/68  Pulse: 85  Temp: 97 F (36.1 C)  Resp: 18   Wt Readings from Last 3 Encounters:  09/18/12 91 lb 8 oz (41.504 kg)  09/04/12 90 lb 8 oz (41.051 kg)  08/04/12 91 lb 4.8 oz (41.413 kg)   ECOG Performance status: 1  PHYSICAL EXAMINATION:   General: Thin-appearing woman, in no acute distress. Eyes: no scleral icterus. ENT: There were no oropharyngeal lesions. Neck was without thyromegaly. Lymphatics: Negative for cervical, supraclavicular, inguinal, axillary adenopathy. Respiratory: lungs were clear bilaterally without wheezing or crackles. Cardiovascular: Regular rate and rhythm, S1/S2, without murmur, rub or gallop. There was no pedal edema. GI: abdomen was soft, flat, nontender, nondistended, without organomegaly. Muscoloskeletal: no spinal tenderness of palpation of vertebral spine. Skin exam was without echymosis, petichae. Neuro exam was nonfocal. Patient was able to get on and off exam table without assistance. Gait was normal.  She can ambulate now on her own. Patient was alerted and oriented. Attention was good. Language was appropriate. Mood was normal without depression. Speech was not  pressured. Thought content was not tangential.  There was increased in skin tone and nonpitting woody, SQ tissue in the right lateral lower calf above the right lateral malleolus.  There is no skin erythema, increased warmth or pain on palpation of the area.    LABORATORY/RADIOLOGY DATA:  Lab Results  Component Value Date   WBC 13.6* 09/18/2012   HGB 10.0* 09/18/2012   HCT 29.3* 09/18/2012   PLT 207 09/18/2012   GLUCOSE 127* 09/18/2012   ALKPHOS 144 09/18/2012   ALT 18 09/18/2012   AST 21 09/18/2012   NA 140 09/18/2012   K 3.7 09/18/2012   CL 103 09/18/2012   CREATININE 0.7 09/18/2012   BUN 9.1 09/18/2012   CO2 28 09/18/2012  INR 0.88 04/18/2012   Imaging: I personally reviewed the following PET scan and showed the pictures the patient and husband.   Findings:  Neck: Prior hypermetabolic lymph nodes have resolved. No current  abnormal extraosseous activity in the neck.  Chest: No hypermetabolic mediastinal or hilar nodes. Emphysema.  Trace right pleural effusion.  Abdomen/Pelvis: No abnormal hypermetabolic activity within the  liver, pancreas, adrenal glands, or spleen. No hypermetabolic  lymph nodes in the abdomen or pelvis. Aortoiliac atherosclerotic  vascular disease. Indistinct right retrocrural nodes are not  hypermetabolic.  Skeleton: Diffuse patchy hypermetabolic activity throughout the  skeleton, probably from marrow stimulating therapy given the  diffuse nature, less likely to be diffuse marrow infiltration by  malignancy. Multiple thoracic and lumbar compression fractures.  IMPRESSION:  1. Resolved adenopathy in the neck. No current abnormal  extraosseous hypermetabolic activity suspicious for tumor.  2. Diffuse new and increased patchy hypermetabolic activity  throughout the skeleton, probably from marrow stimulating therapy  (correlate with patient history), less likely be due to diffuse  marrow infiltration by malignancy.  3. Emphysema with trace right pleural  effusion.  ASSESSMENT AND PLAN:  1.  History of transformed follicular lymphoma to diffuse large B-cell lymphoma - She is s/p 6 cycles of R-CHOP without any residual disease. - Treatment options at this time include:     *  Consideration of starting Rixutan IV every 2 months x 2 year maintenance therapy.  There is a slight risk of infection with maintenance Rituxan. However she tolerate R. CHOP well without problem. Therefore I think she will tolerate this well.  The potential benefit of maintenance Rituxan is to increase the duration of remission. It does not increase survival compared to observation.  *  Observation with repeat scan in about 6 months.    Mrs. Courtney would like to discuss this over with husband and decide in about 2 months.  2. Osteoarhritis: Due to compression fractures. I advised her to follow Dr. Caryl Never for treatment options.  I advised her to also see acupuncture or physical therapy in addition to pain medications.  Her pain is not due to cancer. Therefore pain medication from me is not appropriate to  3. Grade 1 anemia: most likely anemia of chemo.  There is no active bleeding. There is no indication for blood transfusion.   4.  Insomnia:  This was due to prednisone when she was on chemotherapy. She is not on prednisone anymore. I advised her to talk with Dr. Caryl Never for further advice and treatment options.  5.  Grade 1weight loss:  Due to chemotherapy. She is on oral boost supplements.  Her weight has slightly increased  6. Follow up in 2 months.    FULL CODE.

## 2012-09-21 ENCOUNTER — Encounter: Payer: Self-pay | Admitting: Family Medicine

## 2012-09-21 ENCOUNTER — Ambulatory Visit (INDEPENDENT_AMBULATORY_CARE_PROVIDER_SITE_OTHER): Payer: Medicare HMO | Admitting: Family Medicine

## 2012-09-21 VITALS — BP 120/78 | Temp 97.6°F | Wt 94.0 lb

## 2012-09-21 DIAGNOSIS — M810 Age-related osteoporosis without current pathological fracture: Secondary | ICD-10-CM

## 2012-09-21 DIAGNOSIS — M549 Dorsalgia, unspecified: Secondary | ICD-10-CM

## 2012-09-21 DIAGNOSIS — I10 Essential (primary) hypertension: Secondary | ICD-10-CM

## 2012-09-21 MED ORDER — MIRTAZAPINE 15 MG PO TABS: 15.0000 mg | ORAL_TABLET | Freq: Every day | ORAL | Status: DC

## 2012-09-21 MED ORDER — MORPHINE SULFATE 15 MG PO TABS: 15.0000 mg | ORAL_TABLET | Freq: Every morning | ORAL | Status: DC

## 2012-09-21 NOTE — Progress Notes (Signed)
Subjective:    Patient ID: Colleen Cooke, female    DOB: 1935/01/27, 78 y.o.   MRN: [id number]  HPI Patient seen for medical followup.  Chronic problems include history of hypertension, chronic lower extremity edema, diffuse large B-cell lymphoma, osteoporosis, recurrent cellulitis right leg, and history of malnutrition in the context of chronic illness. She's actually recently gained a couple pounds.  She remains on Remeron taking 7.5 mg at night. Generally sleeping well. Denies significant depression issues  She's had some chronic poorly localized back pain thoracic and lumbar region. History of compression fractures though current pain does not localize to any one area. She continues to take morphine intermediate release 15 mg once daily. Requesting refills. She's had previous physical therapy which did not help. There've been discussions of possible acupuncture but she is not interested because of lack of insurance coverage  She has history of lumbar compression fractures but has not had baseline DEXA scan. Remains on Fosamax along with calcium and vitamin D.  Past Medical History  Diagnosis Date  . Chicken pox   . 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 Non-Hodgkin's lymphoma, Burkitt's     Jan. 2013  . Back pain     pt has compression fractures in back  . Wears glasses   . Wears dentures     upper and lower  . Diffuse large B cell lymphoma 04/13/2012   Past Surgical History  Procedure Laterality Date  . Appendectomy      age 29  . Tonsillectomy      age 23  . Mass biopsy  04/04/2012    Procedure: NECK MASS BIOPSY;  Surgeon: Drema Halon, MD;  Location: [facility];  Service: ENT;  Laterality: Right;    reports that she quit smoking about 18 years ago. Her smoking use included Cigarettes. She has a 20 pack-year smoking history. She has never used smokeless tobacco. She reports that she drinks about 0.6 ounces of alcohol per week. She reports that she does not  use illicit drugs. family history includes Coronary artery disease (age of onset: 73) in her mother. Allergies  Allergen Reactions  . Penicillins     REACTION: swelling localized      Review of Systems  Constitutional: Negative for appetite change and unexpected weight change.  Respiratory: Negative for cough and shortness of breath.   Cardiovascular: Negative for chest pain.  Gastrointestinal: Negative for abdominal pain.  Genitourinary: Negative for dysuria.  Musculoskeletal: Positive for back pain.  Neurological: Negative for dizziness and headaches.       Objective:   Physical Exam  Constitutional: She appears well-developed and well-nourished. No distress.  Neck: Neck supple. No thyromegaly present.  Cardiovascular: Normal rate and regular rhythm.   Pulmonary/Chest: Effort normal and breath sounds normal. No respiratory distress. She has no wheezes. She has no rales.  Musculoskeletal: She exhibits edema (Trace edema both legs).  Neurological:  No focal lower extremity weakness  Psychiatric: She has a normal mood and affect. Her behavior is normal.          Assessment & Plan:  #1 chronic low back pain and thoracic back pain. Poorly localized. We discussed physical therapy ans she's not interested. She's looked at options of acupuncture but concerned b/o cost.. 1 refill morphine intermediate release 15 mg 1 daily. We have recommended trying to taper back. #2 osteoporosis. Schedule DEXA scan. We discussed other therapies such as Reclast or Prolia and explained these would be much more costly and she  is not interested at this time.  Currently tolerating Fosamax without difficulty. #3 underweight with history of malnutrition related to chronic illness. Increase in Remeron to 15 mg at night. Continue with protein calorie supplements. #4 history of hypertension which has been stable off medication secondary to her weight loss

## 2012-10-04 ENCOUNTER — Ambulatory Visit (INDEPENDENT_AMBULATORY_CARE_PROVIDER_SITE_OTHER)
Admission: RE | Admit: 2012-10-04 | Discharge: 2012-10-04 | Disposition: A | Discharge status: Home / Self Care | Payer: Medicare HMO | Source: Ambulatory Visit | Attending: Family Medicine | Admitting: Family Medicine

## 2012-10-04 ENCOUNTER — Other Ambulatory Visit: Payer: Self-pay | Admitting: Family Medicine

## 2012-10-04 DIAGNOSIS — M810 Age-related osteoporosis without current pathological fracture: Secondary | ICD-10-CM

## 2012-10-19 ENCOUNTER — Ambulatory Visit: Payer: Medicare HMO | Admitting: Family Medicine

## 2012-10-19 ENCOUNTER — Encounter: Payer: Self-pay | Admitting: Oncology

## 2012-10-19 NOTE — Progress Notes (Signed)
Pt is denied for financial assistance.  Pt is overqualified. I will send denial letter today.

## 2012-10-27 ENCOUNTER — Encounter: Payer: Self-pay | Admitting: Oncology

## 2012-10-27 NOTE — Progress Notes (Signed)
Applied for copay assistance with [place] for Rituxan.  Waiting for response.

## 2012-11-07 ENCOUNTER — Other Ambulatory Visit: Payer: Self-pay | Admitting: Oncology

## 2012-11-16 ENCOUNTER — Telehealth: Payer: Self-pay | Admitting: Family Medicine

## 2012-11-16 MED ORDER — MIRTAZAPINE 15 MG PO TABS: 15.0000 mg | ORAL_TABLET | Freq: Every day | ORAL | Status: DC

## 2012-11-16 MED ORDER — GABAPENTIN 100 MG PO CAPS: 100.0000 mg | ORAL_CAPSULE | Freq: Every day | ORAL | Status: DC

## 2012-11-16 NOTE — Telephone Encounter (Signed)
Pt stated rxs was lost thorough right source. Pt would like to have rxs gabapentin 100mg  #270 and mirtazapine 15 mg #90 sent [facility]

## 2012-11-17 ENCOUNTER — Ambulatory Visit (HOSPITAL_BASED_OUTPATIENT_CLINIC_OR_DEPARTMENT_OTHER): Payer: Medicare HMO | Admitting: Oncology

## 2012-11-17 ENCOUNTER — Other Ambulatory Visit (HOSPITAL_BASED_OUTPATIENT_CLINIC_OR_DEPARTMENT_OTHER): Payer: Medicare HMO | Admitting: Lab

## 2012-11-17 ENCOUNTER — Telehealth: Payer: Self-pay | Admitting: *Deleted

## 2012-11-17 ENCOUNTER — Telehealth: Payer: Self-pay | Admitting: Oncology

## 2012-11-17 ENCOUNTER — Ambulatory Visit (HOSPITAL_BASED_OUTPATIENT_CLINIC_OR_DEPARTMENT_OTHER): Payer: Medicare HMO

## 2012-11-17 VITALS — BP 123/46 | HR 64 | Temp 98.1°F | Resp 18 | Ht 60.5 in | Wt 96.4 lb

## 2012-11-17 VITALS — BP 115/66 | HR 84 | Temp 98.1°F | Resp 20

## 2012-11-17 DIAGNOSIS — M1990 Unspecified osteoarthritis, unspecified site: Secondary | ICD-10-CM

## 2012-11-17 DIAGNOSIS — C8589 Other specified types of non-Hodgkin lymphoma, extranodal and solid organ sites: Secondary | ICD-10-CM

## 2012-11-17 DIAGNOSIS — C8330 Diffuse large B-cell lymphoma, unspecified site: Secondary | ICD-10-CM

## 2012-11-17 DIAGNOSIS — Z5112 Encounter for antineoplastic immunotherapy: Secondary | ICD-10-CM

## 2012-11-17 DIAGNOSIS — R634 Abnormal weight loss: Secondary | ICD-10-CM

## 2012-11-17 DIAGNOSIS — D649 Anemia, unspecified: Secondary | ICD-10-CM

## 2012-11-17 DIAGNOSIS — C8590 Non-Hodgkin lymphoma, unspecified, unspecified site: Secondary | ICD-10-CM

## 2012-11-17 LAB — CBC WITH DIFFERENTIAL/PLATELET
BASO%: 1.5 % (ref 0.0–2.0)
EOS%: 3.1 % (ref 0.0–7.0)
HCT: 31.3 % — ABNORMAL LOW (ref 34.8–46.6)
MCH: 33.9 pg (ref 25.1–34.0)
MCHC: 33.8 g/dL (ref 31.5–36.0)
NEUT%: 70.3 % (ref 38.4–76.8)
lymph#: 0.6 10*3/uL — ABNORMAL LOW (ref 0.9–3.3)

## 2012-11-17 LAB — COMPREHENSIVE METABOLIC PANEL (CC13)
AST: 27 U/L (ref 5–34)
Albumin: 3.5 g/dL (ref 3.5–5.0)
Alkaline Phosphatase: 101 U/L (ref 40–150)
BUN: 13.9 mg/dL (ref 7.0–26.0)
Creatinine: 0.8 mg/dL (ref 0.6–1.1)
Potassium: 4.1 mEq/L (ref 3.5–5.1)

## 2012-11-17 MED ORDER — SODIUM CHLORIDE 0.9 % IJ SOLN
10.0000 mL | INTRAMUSCULAR | Status: DC | PRN
  Administered 2012-11-17: 10 mL
  Filled 2012-11-17: qty 10

## 2012-11-17 MED ORDER — DIPHENHYDRAMINE HCL 25 MG PO CAPS
50.0000 mg | ORAL_CAPSULE | Freq: Once | ORAL | Status: AC
  Administered 2012-11-17: 50 mg via ORAL

## 2012-11-17 MED ORDER — HEPARIN SOD (PORK) LOCK FLUSH 100 UNIT/ML IV SOLN
500.0000 [IU] | Freq: Once | INTRAVENOUS | Status: AC | PRN
  Administered 2012-11-17: 500 [IU]
  Filled 2012-11-17: qty 5

## 2012-11-17 MED ORDER — ACETAMINOPHEN 325 MG PO TABS
650.0000 mg | ORAL_TABLET | Freq: Once | ORAL | Status: AC
  Administered 2012-11-17: 650 mg via ORAL

## 2012-11-17 MED ORDER — SODIUM CHLORIDE 0.9 % IV SOLN
Freq: Once | INTRAVENOUS | Status: AC
  Administered 2012-11-17: 10:00:00 via INTRAVENOUS

## 2012-11-17 MED ORDER — SODIUM CHLORIDE 0.9 % IV SOLN
375.0000 mg/m2 | Freq: Once | INTRAVENOUS | Status: AC
  Administered 2012-11-17: 500 mg via INTRAVENOUS
  Filled 2012-11-17: qty 50

## 2012-11-17 NOTE — Patient Instructions (Addendum)
[facility] Discharge Instructions for Patients Receiving Chemotherapy  Today you received the following chemotherapy agents Rituxan.  To help prevent nausea and vomiting after your treatment, we encourage you to take your nausea medication as prescribed.    If you develop nausea and vomiting that is not controlled by your nausea medication, call the clinic. If it is after clinic hours your family physician or the after hours number for the clinic or go to the Emergency Department.   BELOW ARE SYMPTOMS THAT SHOULD BE REPORTED IMMEDIATELY:  *FEVER GREATER THAN 100.5 F  *CHILLS WITH OR WITHOUT FEVER  NAUSEA AND VOMITING THAT IS NOT CONTROLLED WITH YOUR NAUSEA MEDICATION  *UNUSUAL SHORTNESS OF BREATH  *UNUSUAL BRUISING OR BLEEDING  TENDERNESS IN MOUTH AND THROAT WITH OR WITHOUT PRESENCE OF ULCERS  *URINARY PROBLEMS  *BOWEL PROBLEMS  UNUSUAL RASH Items with * indicate a potential emergency and should be followed up as soon as possible.  Please let the nurse know about any problems that you may have experienced. Feel free to call the clinic you have any questions or concerns. The clinic phone number is [phone].   I have been informed and understand all the instructions given to me. I know to contact the clinic, my physician, or go to the Emergency Department if any problems should occur. I do not have any questions at this time, but understand that I may call the clinic during office hours   should I have any questions or need assistance in obtaining follow up care.    __________________________________________  _____________  __________ Signature of Patient or Authorized Representative            Date                   Time    __________________________________________ Nurse's Signature    

## 2012-11-17 NOTE — Telephone Encounter (Signed)
Per staff message and POF I have scheduled appts.  JMW  

## 2012-11-19 NOTE — Progress Notes (Signed)
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   PAST THERAPY: For history of stage IV, low grade but high risk follicular lymphoma: 3 cycles of Bendamustine/Rituxan between Jan 2013 and March 2013. She stopped chemo due to severe pain from compression fracture and severe bone pain.  She developed transformed DLBCL and was treated with mini-RCHOP between 04/24/2012 and 09/04/2012.  She was was remission.   CURRENT THERAPY: due to start maintenance Rituxan today 11/17/2012 for follicular NHL.   INTERVAL HISTORY: Colleen Cooke 78 y.o. female returns for regular follow up by herself.  She reports feeling well.  He appetite and her weight continue to increase.  She denied fatigue, palpable node, anorexia, night sweat, neuropathy, chest pain, PND, orthopnea, skin rash.  She still has chronic lower back pain from DJD which is stable.  The rest of the 14-point review of system was negative.    Past Medical History  Diagnosis Date  . Chicken pox   . 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 Non-Hodgkin's lymphoma, Burkitt's     Jan. 2013  . Back pain     pt has compression fractures in back  . Wears glasses   . Wears dentures     upper and lower  . Diffuse large B cell lymphoma 04/13/2012    Past Surgical History  Procedure Laterality Date  . Appendectomy      age 30  . Tonsillectomy      age 7  . Mass biopsy  04/04/2012    Procedure: NECK MASS BIOPSY;  Surgeon: Drema Halon, MD;  Location: [facility];  Service: ENT;  Laterality: Right;    Current Outpatient Prescriptions  Medication Sig Dispense Refill  . acetaminophen (TYLENOL) 500 MG tablet Take 1,000 mg by mouth every morning.      Marland Kitchen alendronate (FOSAMAX) 70 MG tablet Take 1 tablet (70 mg total) by mouth every  Sunday. Take with a full glass of water on an empty stomach.  12 tablet  3  . Calcium Carbonate (CALCIUM 600 PO) Take 630 mg by mouth daily.      . cholecalciferol (VITAMIN D) 1000 UNITS tablet Take 1,000 Units by mouth daily.      . furosemide (LASIX) 20 MG tablet Take 1 tablet (20 mg total) by mouth daily.  30 tablet  3  . gabapentin (NEURONTIN) 100 MG capsule Take 1 capsule (100 mg total) by mouth daily.  90 capsule  3  . lactose free nutrition (BOOST PLUS) LIQD Take 237 mLs by mouth 3 (three) times daily with meals.  10 Can  3  . lidocaine-prilocaine (EMLA) cream Apply topically as needed. Put it on portacath one hour before access.  30 g  2  . mirtazapine (REMERON) 15 MG tablet Take 1 tablet (15 mg total) by mouth at bedtime.  90 tablet  3  . OVER THE COUNTER MEDICATION Take 1,000 mg by mouth daily. Emergency vitamin c powder      . polyethylene glycol (MIRALAX / GLYCOLAX) packet Take 17 g by mouth every morning.      Marland Kitchen morphine (MSIR) 15 MG tablet Take 1 tablet (15 mg total) by mouth every morning.  30 tablet  0   No current facility-administered medications for this visit.    ALLERGIES:  is allergic to penicillins.  Filed Vitals:   11/17/12 0840  BP: 123/46  Pulse: 64  Temp: 98.1 F (36.7 C)  Resp: 18   Wt Readings from Last 3 Encounters:  11/17/12 96 lb 6.4 oz (43.727 kg)  09/21/12 94 lb (42.638 kg)  09/18/12 91 lb 8 oz (41.504 kg)   ECOG Performance status: 0-1  PHYSICAL EXAMINATION:   General: Thin-appearing woman, in no acute distress. Eyes: no scleral icterus. ENT: There were no oropharyngeal lesions. Neck was without thyromegaly. Lymphatics: Negative for cervical, supraclavicular, inguinal, axillary adenopathy. Respiratory: lungs were clear bilaterally without wheezing or crackles. Cardiovascular: Regular rate and rhythm, S1/S2, without murmur, rub or gallop. There was no pedal edema. GI: abdomen was soft, flat, nontender, nondistended, without organomegaly.  Muscoloskeletal: no spinal tenderness of palpation of vertebral spine. Skin exam was without echymosis, petichae. Neuro exam was nonfocal. Patient was able to get on and off exam table without assistance. Gait was normal.  She can ambulate now on her own. Patient was alert and oriented. Attention was good. Language was appropriate. Mood was normal without depression. Speech was not pressured. Thought content was not tangential.     LABORATORY/RADIOLOGY DATA:  Lab Results  Component Value Date   WBC 4.6 11/17/2012   HGB 10.6* 11/17/2012   HCT 31.3* 11/17/2012   PLT 214 11/17/2012   GLUCOSE 98 11/17/2012   ALKPHOS 101 11/17/2012   ALT 19 11/17/2012   AST 27 11/17/2012   NA 139 11/17/2012   K 4.1 11/17/2012   CL 102 11/17/2012   CREATININE 0.8 11/17/2012   BUN 13.9 11/17/2012   CO2 27 11/17/2012   INR 0.88 04/18/2012   ASSESSMENT AND PLAN:  1.  History of transformed follicular lymphoma to diffuse large B-cell lymphoma - She is s/p 6 cycles of R-CHOP without any residual disease. - She had the chance to think it over and discussed with her husband.  I again explained to Ms. Goodgame the potential benefit of maintenance Rituxan to increase duration of remission.  There are potential risks of maintenance Rituxan which include but not limited to increased risk of  Infection.  Ms. Swendsen expressed informed understanding and would like to start maintenance Rituxan for follicular lymphoma today.    2. Osteoarhritis: Due to compression fractures. She is on pain meds per PCP prn.   3. Grade 1 anemia: most likely anemia of chemo.  There is no active bleeding. There is no indication for blood transfusion.   4.  Grade 1weight loss:  Due to chemotherapy. This has slowly improve.   6. Follow up in 2 months.      I informed Ms. Asaro that I am leaving the practice.  [facility] will [facility] for her to see a new provider when she returns.    Jonny Longino T. Gaylyn Rong, M.D.

## 2012-12-15 ENCOUNTER — Encounter: Payer: Self-pay | Admitting: Family Medicine

## 2012-12-15 ENCOUNTER — Ambulatory Visit (INDEPENDENT_AMBULATORY_CARE_PROVIDER_SITE_OTHER): Payer: Medicare HMO | Admitting: Family Medicine

## 2012-12-15 VITALS — BP 140/68 | Temp 98.3°F | Wt 101.0 lb

## 2012-12-15 DIAGNOSIS — M549 Dorsalgia, unspecified: Secondary | ICD-10-CM

## 2012-12-15 DIAGNOSIS — C8589 Other specified types of non-Hodgkin lymphoma, extranodal and solid organ sites: Secondary | ICD-10-CM

## 2012-12-15 DIAGNOSIS — C8590 Non-Hodgkin lymphoma, unspecified, unspecified site: Secondary | ICD-10-CM

## 2012-12-15 DIAGNOSIS — I10 Essential (primary) hypertension: Secondary | ICD-10-CM

## 2012-12-15 DIAGNOSIS — M810 Age-related osteoporosis without current pathological fracture: Secondary | ICD-10-CM

## 2012-12-15 DIAGNOSIS — R636 Underweight: Secondary | ICD-10-CM

## 2012-12-15 MED ORDER — FUROSEMIDE 20 MG PO TABS: 20.0000 mg | ORAL_TABLET | Freq: Every day | ORAL | Status: DC

## 2012-12-15 NOTE — Progress Notes (Signed)
Subjective:    Patient ID: Colleen Cooke, female    DOB: 1935-05-27, 78 y.o.   MRN: [id number]  HPI Patient has history of lymphoma, osteoporosis with compression fractures, past history of hypertension, normocytic anemia. She is on maintenance therapy for lymphoma per oncology. This will go for about 2 years. She's been underweight and we increased her Remeron to 15 mg last visit. She has gained 7 pounds since then and is sleeping good and feels very well overall  She still has some back pains which are somewhat poorly localized. She's not taking any morphine. Pain generally managed with Tylenol  Past history of hypertension but currently stable with her recent weight loss issues. By home monitor consistently around 120/70. She continues to take Lasix 20 mg as needed for edema issues and this is rare. No recent dyspnea. No chest pains  Past Medical History  Diagnosis Date  . Chicken pox   . 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 Non-Hodgkin's lymphoma, Burkitt's     Jan. 2013  . Back pain     pt has compression fractures in back  . Wears glasses   . Wears dentures     upper and lower  . Diffuse large B cell lymphoma 04/13/2012   Past Surgical History  Procedure Laterality Date  . Appendectomy      age 55  . Tonsillectomy      age 51  . Mass biopsy  04/04/2012    Procedure: NECK MASS BIOPSY;  Surgeon: Drema Halon, MD;  Location: [facility];  Service: ENT;  Laterality: Right;    reports that she quit smoking about 18 years ago. Her smoking use included Cigarettes. She has a 20 pack-year smoking history. She has never used smokeless tobacco. She reports that she drinks about 0.6 ounces of alcohol per week. She reports that she does not use illicit drugs. family history includes Coronary artery disease (age of onset: 79) in her mother. Allergies  Allergen Reactions  . Penicillins     REACTION: swelling localized     Review of Systems  Constitutional: Negative  for fever, chills, appetite change and fatigue.  Eyes: Negative for visual disturbance.  Respiratory: Negative for cough, chest tightness, shortness of breath and wheezing.   Cardiovascular: Negative for chest pain, palpitations and leg swelling.  Endocrine: Negative for polydipsia and polyuria.  Musculoskeletal: Positive for back pain.  Neurological: Negative for dizziness, seizures, syncope, weakness, light-headedness and headaches.  Psychiatric/Behavioral: Negative for sleep disturbance.       Objective:   Physical Exam  Constitutional: She appears well-developed and well-nourished.  HENT:  Right Ear: External ear normal.  Left Ear: External ear normal.  Mouth/Throat: Oropharynx is clear and moist.  Neck: Neck supple. No thyromegaly present.  No cervical adenopathy  Cardiovascular: Normal rate and regular rhythm.   Pulmonary/Chest: Effort normal and breath sounds normal. No respiratory distress. She has no wheezes. She has no rales.  Musculoskeletal: She exhibits no edema.  Psychiatric: She has a normal mood and affect. Her behavior is normal. Judgment and thought content normal.          Assessment & Plan:  #1 underweight. Her weight has gone up significantly -7 pounds since last visit. Continue Remeron 15 mg at night #2 lymphoma on maintenance therapy per oncology #3 history of hypertension. Currently stable off medications #4 peripheral edema. Mild. Suspect this will improve as her nutritional status and low albumin improve. Refill furosemide for as needed use only #5 chronic  back pain. History of compression fractures. Continue Fosamax calcium and vitamin D. Infrequent use of morphine for severe pain

## 2013-01-16 ENCOUNTER — Telehealth: Payer: Self-pay | Admitting: *Deleted

## 2013-01-16 ENCOUNTER — Ambulatory Visit (HOSPITAL_BASED_OUTPATIENT_CLINIC_OR_DEPARTMENT_OTHER): Payer: Medicare HMO | Admitting: Oncology

## 2013-01-16 ENCOUNTER — Telehealth: Payer: Self-pay | Admitting: Oncology

## 2013-01-16 ENCOUNTER — Other Ambulatory Visit (HOSPITAL_BASED_OUTPATIENT_CLINIC_OR_DEPARTMENT_OTHER): Payer: Medicare HMO | Admitting: Lab

## 2013-01-16 ENCOUNTER — Ambulatory Visit (HOSPITAL_BASED_OUTPATIENT_CLINIC_OR_DEPARTMENT_OTHER): Payer: Medicare HMO

## 2013-01-16 ENCOUNTER — Encounter: Payer: Self-pay | Admitting: Oncology

## 2013-01-16 VITALS — BP 118/45 | HR 81 | Temp 97.4°F | Resp 18

## 2013-01-16 VITALS — BP 128/51 | HR 94 | Temp 97.7°F | Resp 19 | Ht 60.0 in | Wt 100.9 lb

## 2013-01-16 DIAGNOSIS — C8590 Non-Hodgkin lymphoma, unspecified, unspecified site: Secondary | ICD-10-CM

## 2013-01-16 DIAGNOSIS — C8330 Diffuse large B-cell lymphoma, unspecified site: Secondary | ICD-10-CM

## 2013-01-16 DIAGNOSIS — C8589 Other specified types of non-Hodgkin lymphoma, extranodal and solid organ sites: Secondary | ICD-10-CM

## 2013-01-16 DIAGNOSIS — M1990 Unspecified osteoarthritis, unspecified site: Secondary | ICD-10-CM

## 2013-01-16 DIAGNOSIS — Z5112 Encounter for antineoplastic immunotherapy: Secondary | ICD-10-CM

## 2013-01-16 LAB — CBC WITH DIFFERENTIAL/PLATELET
Basophils Absolute: 0.1 10*3/uL (ref 0.0–0.1)
EOS%: 2.5 % (ref 0.0–7.0)
HCT: 36.3 % (ref 34.8–46.6)
HGB: 11.9 g/dL (ref 11.6–15.9)
MCH: 32.6 pg (ref 25.1–34.0)
MCV: 99.5 fL (ref 79.5–101.0)
MONO%: 10.7 % (ref 0.0–14.0)
NEUT%: 67.1 % (ref 38.4–76.8)
Platelets: 159 10*3/uL (ref 145–400)

## 2013-01-16 LAB — COMPREHENSIVE METABOLIC PANEL (CC13)
CO2: 27 mEq/L (ref 22–29)
Calcium: 9.8 mg/dL (ref 8.4–10.4)
Creatinine: 0.7 mg/dL (ref 0.6–1.1)
Glucose: 76 mg/dl (ref 70–140)
Total Bilirubin: 0.3 mg/dL (ref 0.20–1.20)
Total Protein: 6.1 g/dL — ABNORMAL LOW (ref 6.4–8.3)

## 2013-01-16 MED ORDER — HEPARIN SOD (PORK) LOCK FLUSH 100 UNIT/ML IV SOLN
500.0000 [IU] | Freq: Once | INTRAVENOUS | Status: AC | PRN
  Administered 2013-01-16: 500 [IU]
  Filled 2013-01-16: qty 5

## 2013-01-16 MED ORDER — SODIUM CHLORIDE 0.9 % IJ SOLN
10.0000 mL | INTRAMUSCULAR | Status: DC | PRN
  Administered 2013-01-16: 10 mL
  Filled 2013-01-16: qty 10

## 2013-01-16 MED ORDER — DIPHENHYDRAMINE HCL 25 MG PO CAPS
50.0000 mg | ORAL_CAPSULE | Freq: Once | ORAL | Status: AC
  Administered 2013-01-16: 50 mg via ORAL

## 2013-01-16 MED ORDER — SODIUM CHLORIDE 0.9 % IV SOLN
Freq: Once | INTRAVENOUS | Status: AC
  Administered 2013-01-16: 11:00:00 via INTRAVENOUS

## 2013-01-16 MED ORDER — ACETAMINOPHEN 325 MG PO TABS
650.0000 mg | ORAL_TABLET | Freq: Once | ORAL | Status: AC
  Administered 2013-01-16: 650 mg via ORAL

## 2013-01-16 MED ORDER — SODIUM CHLORIDE 0.9 % IV SOLN
375.0000 mg/m2 | Freq: Once | INTRAVENOUS | Status: AC
  Administered 2013-01-16: 500 mg via INTRAVENOUS
  Filled 2013-01-16: qty 50

## 2013-01-16 NOTE — Telephone Encounter (Signed)
Printed AVS , with pt appt on 9/22 MD and 9/19 lab , gave pt oral contrast for CT

## 2013-01-16 NOTE — Telephone Encounter (Signed)
Per staff message and POF I have scheduled appts.  JMW  

## 2013-01-16 NOTE — Patient Instructions (Addendum)
[facility] Discharge Instructions for Patients Receiving Chemotherapy  Today you received the following chemotherapy agents Rituxan.  To help prevent nausea and vomiting after your treatment, we encourage you to take your nausea medication as prescribed.    If you develop nausea and vomiting that is not controlled by your nausea medication, call the clinic. If it is after clinic hours your family physician or the after hours number for the clinic or go to the Emergency Department.   BELOW ARE SYMPTOMS THAT SHOULD BE REPORTED IMMEDIATELY:  *FEVER GREATER THAN 100.5 F  *CHILLS WITH OR WITHOUT FEVER  NAUSEA AND VOMITING THAT IS NOT CONTROLLED WITH YOUR NAUSEA MEDICATION  *UNUSUAL SHORTNESS OF BREATH  *UNUSUAL BRUISING OR BLEEDING  TENDERNESS IN MOUTH AND THROAT WITH OR WITHOUT PRESENCE OF ULCERS  *URINARY PROBLEMS  *BOWEL PROBLEMS  UNUSUAL RASH Items with * indicate a potential emergency and should be followed up as soon as possible.  Please let the nurse know about any problems that you may have experienced. Feel free to call the clinic you have any questions or concerns. The clinic phone number is [phone].   I have been informed and understand all the instructions given to me. I know to contact the clinic, my physician, or go to the Emergency Department if any problems should occur. I do not have any questions at this time, but understand that I may call the clinic during office hours   should I have any questions or need assistance in obtaining follow up care.    __________________________________________  _____________  __________ Signature of Patient or Authorized Representative            Date                   Time    __________________________________________ Nurse's Signature

## 2013-01-16 NOTE — Progress Notes (Signed)
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   PAST THERAPY: For history of stage IV, low grade but high risk follicular lymphoma: 3 cycles of Bendamustine/Rituxan between Jan 2013 and March 2013. She stopped chemo due to severe pain from compression fracture and severe bone pain.  She developed transformed DLBCL and was treated with mini-RCHOP between 04/24/2012 and 09/04/2012.  She was was remission.   CURRENT THERAPY: Started maintenance Rituxan today 11/17/2012 for follicular NHL.   INTERVAL HISTORY: Colleen Cooke 78 y.o. female returns for regular follow up by herself.  She reports feeling well.  He appetite and her weight continue to increase.  She denied fatigue, palpable node, anorexia, night sweat, neuropathy, chest pain, PND, orthopnea, skin rash.  She still has chronic lower back pain from DJD which is stable.  The rest of the 14-point review of system was negative.    Past Medical History  Diagnosis Date  . Chicken pox   . 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 Non-Hodgkin's lymphoma, Burkitt's     Jan. 2013  . Back pain     pt has compression fractures in back  . Wears glasses   . Wears dentures     upper and lower  . Diffuse large B cell lymphoma 04/13/2012    Past Surgical History  Procedure Laterality Date  . Appendectomy      age 17  . Tonsillectomy      age 9  . Mass biopsy  04/04/2012    Procedure: NECK MASS BIOPSY;  Surgeon: Drema Halon, MD;  Location: [facility];  Service: ENT;  Laterality: Right;    Current Outpatient Prescriptions  Medication Sig Dispense Refill  . alendronate (FOSAMAX) 70 MG tablet Take 1 tablet (70 mg total) by mouth every Sunday. Take with a full glass of water on an empty stomach.  12 tablet  3  . Calcium  Carbonate (CALCIUM 600 PO) Take 630 mg by mouth daily.      . cholecalciferol (VITAMIN D) 1000 UNITS tablet Take 1,000 Units by mouth daily.      Marland Kitchen gabapentin (NEURONTIN) 100 MG capsule Take 1 capsule (100 mg total) by mouth daily.  90 capsule  3  . lidocaine-prilocaine (EMLA) cream Apply topically as needed. Put it on portacath one hour before access.  30 g  2  . mirtazapine (REMERON) 15 MG tablet Take 1 tablet (15 mg total) by mouth at bedtime.  90 tablet  3  . OVER THE COUNTER MEDICATION Take 1,000 mg by mouth daily. Emergency vitamin c powder       No current facility-administered medications for this visit.    ALLERGIES:  is allergic to penicillins.   Filed Vitals:   01/16/13 0938  BP: 128/51  Pulse: 94  Temp: 97.7 F (36.5 C)  Resp: 19   Wt Readings from Last 3 Encounters:  01/16/13 100 lb 14.4 oz (45.768 kg)  12/15/12 101 lb (45.813 kg)  11/17/12 96 lb 6.4 oz (43.727 kg)   ECOG Performance status: 0-1  PHYSICAL EXAMINATION:   General: Thin-appearing woman, in no acute distress. Eyes: no scleral icterus. ENT: There were no oropharyngeal lesions. Neck was without thyromegaly. Lymphatics: Negative for cervical, supraclavicular, inguinal, axillary adenopathy. Respiratory: lungs were clear bilaterally without wheezing or crackles. Cardiovascular: Regular rate and rhythm,  S1/S2, without murmur, rub or gallop. There was no pedal edema. GI: abdomen was soft, flat, nontender, nondistended, without organomegaly. Muscoloskeletal: no spinal tenderness of palpation of vertebral spine. Skin exam was without echymosis, petichae. Neuro exam was nonfocal. Patient was able to get on and off exam table without assistance. Gait was normal.  She can ambulate now on her own. Patient was alert and oriented. Attention was good. Language was appropriate. Mood was normal without depression. Speech was not pressured. Thought content was not tangential.     LABORATORY/RADIOLOGY DATA:  Lab Results    Component Value Date   WBC 4.9 01/16/2013   HGB 11.9 01/16/2013   HCT 36.3 01/16/2013   PLT 159 01/16/2013   GLUCOSE 98 11/17/2012   ALKPHOS 101 11/17/2012   ALT 19 11/17/2012   AST 27 11/17/2012   NA 139 11/17/2012   K 4.1 11/17/2012   CL 102 11/17/2012   CREATININE 0.8 11/17/2012   BUN 13.9 11/17/2012   CO2 27 11/17/2012   INR 0.88 04/18/2012   ASSESSMENT AND PLAN:  1.  History of transformed follicular lymphoma to diffuse large B-cell lymphoma - She is s/p 6 cycles of R-CHOP without any residual disease. - She is now maintenance Rituxan tolerating this well. Recommend that she proceed with Rituxan today that dose modification. - She is due for restaging CT scans for surveillance in September 2014 which I have ordered today.  2. Osteoarhritis: Due to compression fractures. Pain controlled without the use of medication.   3. Grade 1 anemia: Resolved.   4.  Grade 1weight loss:  Due to chemotherapy. This has slowly improve.   5. Follow up in 2 months.

## 2013-01-17 ENCOUNTER — Ambulatory Visit: Payer: Medicare HMO

## 2013-01-17 NOTE — Progress Notes (Signed)
Rituxan infused over 1 hr 45 min.  Pt denies complaints. V/S WNL. Colleen Cooke notified. Pt discharged alert and ambulatory with no complaints.

## 2013-01-18 ENCOUNTER — Encounter: Payer: Self-pay | Admitting: Oncology

## 2013-01-20 ENCOUNTER — Other Ambulatory Visit: Payer: Self-pay | Admitting: Oncology

## 2013-01-22 ENCOUNTER — Telehealth: Payer: Self-pay | Admitting: Oncology

## 2013-01-22 NOTE — Telephone Encounter (Signed)
s.w. pt and advised that ct scan in Sept has been cx...pt ok and aware

## 2013-03-16 ENCOUNTER — Other Ambulatory Visit (HOSPITAL_BASED_OUTPATIENT_CLINIC_OR_DEPARTMENT_OTHER): Payer: Commercial Managed Care - HMO

## 2013-03-16 ENCOUNTER — Other Ambulatory Visit (HOSPITAL_COMMUNITY): Payer: Medicare HMO

## 2013-03-16 DIAGNOSIS — C8330 Diffuse large B-cell lymphoma, unspecified site: Secondary | ICD-10-CM

## 2013-03-16 DIAGNOSIS — C8589 Other specified types of non-Hodgkin lymphoma, extranodal and solid organ sites: Secondary | ICD-10-CM

## 2013-03-16 LAB — COMPREHENSIVE METABOLIC PANEL (CC13)
ALT: 13 U/L (ref 0–55)
AST: 24 U/L (ref 5–34)
BUN: 12.7 mg/dL (ref 7.0–26.0)
Creatinine: 0.8 mg/dL (ref 0.6–1.1)
Total Bilirubin: 0.39 mg/dL (ref 0.20–1.20)

## 2013-03-16 LAB — CBC WITH DIFFERENTIAL/PLATELET
BASO%: 0.4 % (ref 0.0–2.0)
Basophils Absolute: 0 10*3/uL (ref 0.0–0.1)
HCT: 37.4 % (ref 34.8–46.6)
HGB: 12.7 g/dL (ref 11.6–15.9)
LYMPH%: 14.9 % (ref 14.0–49.7)
MCHC: 34 g/dL (ref 31.5–36.0)
MONO#: 0.5 10*3/uL (ref 0.1–0.9)
NEUT%: 69.3 % (ref 38.4–76.8)
Platelets: 171 10*3/uL (ref 145–400)
WBC: 4.3 10*3/uL (ref 3.9–10.3)

## 2013-03-19 ENCOUNTER — Encounter: Payer: Self-pay | Admitting: Hematology and Oncology

## 2013-03-19 ENCOUNTER — Ambulatory Visit (HOSPITAL_BASED_OUTPATIENT_CLINIC_OR_DEPARTMENT_OTHER): Payer: Medicare HMO | Admitting: Hematology and Oncology

## 2013-03-19 ENCOUNTER — Ambulatory Visit (HOSPITAL_BASED_OUTPATIENT_CLINIC_OR_DEPARTMENT_OTHER): Payer: Commercial Managed Care - HMO

## 2013-03-19 VITALS — BP 106/64 | HR 86 | Temp 98.0°F | Resp 16

## 2013-03-19 VITALS — BP 139/51 | HR 102 | Temp 97.2°F | Resp 18 | Ht 60.0 in | Wt 107.1 lb

## 2013-03-19 DIAGNOSIS — M898X9 Other specified disorders of bone, unspecified site: Secondary | ICD-10-CM

## 2013-03-19 DIAGNOSIS — C8330 Diffuse large B-cell lymphoma, unspecified site: Secondary | ICD-10-CM

## 2013-03-19 DIAGNOSIS — C8590 Non-Hodgkin lymphoma, unspecified, unspecified site: Secondary | ICD-10-CM

## 2013-03-19 DIAGNOSIS — C8589 Other specified types of non-Hodgkin lymphoma, extranodal and solid organ sites: Secondary | ICD-10-CM

## 2013-03-19 DIAGNOSIS — Z5112 Encounter for antineoplastic immunotherapy: Secondary | ICD-10-CM

## 2013-03-19 DIAGNOSIS — M899 Disorder of bone, unspecified: Secondary | ICD-10-CM

## 2013-03-19 MED ORDER — ACETAMINOPHEN 325 MG PO TABS
ORAL_TABLET | ORAL | Status: AC
  Filled 2013-03-19: qty 2

## 2013-03-19 MED ORDER — DIPHENHYDRAMINE HCL 25 MG PO CAPS
ORAL_CAPSULE | ORAL | Status: AC
  Filled 2013-03-19: qty 2

## 2013-03-19 MED ORDER — SODIUM CHLORIDE 0.9 % IJ SOLN
10.0000 mL | INTRAMUSCULAR | Status: DC | PRN
  Administered 2013-03-19: 10 mL
  Filled 2013-03-19: qty 10

## 2013-03-19 MED ORDER — ACETAMINOPHEN 325 MG PO TABS
650.0000 mg | ORAL_TABLET | Freq: Once | ORAL | Status: AC
  Administered 2013-03-19: 650 mg via ORAL

## 2013-03-19 MED ORDER — DIPHENHYDRAMINE HCL 25 MG PO CAPS
50.0000 mg | ORAL_CAPSULE | Freq: Once | ORAL | Status: AC
  Administered 2013-03-19: 50 mg via ORAL

## 2013-03-19 MED ORDER — SODIUM CHLORIDE 0.9 % IV SOLN
375.0000 mg/m2 | Freq: Once | INTRAVENOUS | Status: AC
  Administered 2013-03-19: 500 mg via INTRAVENOUS
  Filled 2013-03-19: qty 50

## 2013-03-19 MED ORDER — HEPARIN SOD (PORK) LOCK FLUSH 100 UNIT/ML IV SOLN
500.0000 [IU] | Freq: Once | INTRAVENOUS | Status: AC | PRN
  Administered 2013-03-19: 500 [IU]
  Filled 2013-03-19: qty 5

## 2013-03-19 MED ORDER — SODIUM CHLORIDE 0.9 % IV SOLN
Freq: Once | INTRAVENOUS | Status: AC
  Administered 2013-03-19: 10:00:00 via INTRAVENOUS

## 2013-03-19 NOTE — Patient Instructions (Addendum)

## 2013-03-19 NOTE — Progress Notes (Signed)
[facility] OFFICE PROGRESS NOTE  Colleen Covey, MD [address] [place] [address] No chief complaint on file.   DIAGNOSIS: History of high-risk follicular lymphoma and transformation into diffuse large B cell lymphoma, all maintainance rituximab  SUMMARY OF HEMATOLOGIC HISTORY: For history of stage IV, low grade but high risk follicular lymphoma: 3 cycles of Bendamustine/Rituxan between Jan 2013 and March 2013. According to the patient, she presented with a palpable lump. She stopped chemo due to severe pain from compression fracture and severe bone pain.  She developed transformed DLBCL and was treated with mini-RCHOP between 04/24/2012 and 09/04/2012.  She was in remission.   INTERVAL HISTORY: Colleen Cooke 78 y.o. female returns for further followup prior to cycle maintain his rituximab. She is doing very well, denies any new palpable lumps or bumps. Her appetite is stable and she continued to gain weight. Denies any recent fevers, chills, or night sweats. She denies any recent infections. She still has persistent back pain, but she cannot tolerate narcotics and she felt narcotics do not work. She just take a break if her back starts hurting. She manages well with just conservative management.  I have reviewed the past medical history, past surgical history, social history and family history with the patient and they are unchanged from previous note.  ALLERGIES:  is allergic to penicillins.  MEDICATIONS: has a current medication list which includes the following prescription(s): alendronate, calcium carbonate, cholecalciferol, gabapentin, lidocaine-prilocaine, mirtazapine, OVER THE COUNTER MEDICATION, and furosemide.   REVIEW OF SYSTEMS:   Constitutional: Denies fevers, chills or abnormal weight loss Eyes: Denies blurriness of vision Ears, nose, mouth, throat, and face: Denies mucositis or sore throat Respiratory: Denies cough, dyspnea or  wheezes Cardiovascular: Denies palpitation, chest discomfort or lower extremity swelling Gastrointestinal:  Denies nausea, heartburn or change in bowel habits Skin: Denies abnormal skin rashes Lymphatics: Denies new lymphadenopathy or easy bruising Neurological:Denies numbness, tingling or new weaknesses Behavioral/Psych: Mood is stable, no new changes  All other systems were reviewed with the patient and are negative.  PHYSICAL EXAMINATION: ECOG PERFORMANCE STATUS: 0 - Asymptomatic  Filed Vitals:   03/19/13 0900  BP: 139/51  Pulse: 102  Temp: 97.2 F (36.2 C)  Resp: 18    GENERAL:alert, no distress and comfortable. She will thin, mildly cachectic SKIN: skin color, texture, turgor are normal, no rashes or significant lesions EYES: normal, Conjunctiva are pink and non-injected, sclera clear OROPHARYNX:no exudate, no erythema and lips, buccal mucosa, and tongue normal  NECK: supple, thyroid normal size, non-tender, without nodularity LYMPH:  no palpable lymphadenopathy in the cervical, axillary or inguinal LUNGS: clear to auscultation and percussion with normal breathing effort HEART: regular rate & rhythm and no murmurs and no lower extremity edema ABDOMEN:abdomen soft, non-tender and normal bowel sounds Musculoskeletal:no cyanosis of digits and no clubbing  NEURO: alert & oriented x 3 with fluent speech, no focal motor/sensory deficits  LABORATORY DATA:  I have reviewed the data as listed Her last last week showed normal CBC. LDH is mildly elevated.  ASSESSMENT: diffuse large B cell lymphoma, in remission, on maintain his rituximab as to  #1 lymphoma We will proceed with rituximab today. The treatment guidelines recommend every other month for turgor of 2 years. She tolerated treatment well. We discussed about the risk and benefit of routine imaging study. Routine imaging studies have not been shown to improve survival. I recommend history and physical examination as well as  blood work every other month and 4  the patient reported she have any signs and symptoms of recurrence of disease. She is in agreement. #2 chronic back pain secondary to compression fractures 2 continue calcium, vitamin D, as well as Fosamax. The patient has declined narcotic prescription. #3 history of weight loss She started to gain weight. This is a good sign. She is losing weight again, we might need to repeat imaging studies to rule out recurrence of disease. In the meantime I continue to encourage her to increase oral intake.. #4 preventive care Since the patient has received chemotherapy recently, I recommend prophylactic influenza vaccination. She will see her primary care provider later this week to get vaccination in his clinic.  All questions were answered. The patient knows to call the clinic with any problems, questions or concerns. We can certainly see the patient much sooner if necessary. No barriers to learning was detected.  The patient and plan discussed with [facility], Colleen Cooke  and she is in agreement with the aforementioned.  I spent 25 minutes counseling the patient face to face. The total time spent in the appointment was 40 minutes and more than 50% was on counseling.     Colleen Boney, MD 03/19/2013 9:22 AM

## 2013-03-22 ENCOUNTER — Telehealth: Payer: Self-pay | Admitting: Hematology and Oncology

## 2013-03-22 ENCOUNTER — Other Ambulatory Visit: Payer: Self-pay | Admitting: Hematology and Oncology

## 2013-03-22 NOTE — Telephone Encounter (Signed)
x

## 2013-03-22 NOTE — Telephone Encounter (Signed)
Sent dr Bertis Ruddy a staff message regarding this pt asking about her rit tx for two months

## 2013-03-29 ENCOUNTER — Encounter: Payer: Self-pay | Admitting: Family Medicine

## 2013-03-29 ENCOUNTER — Ambulatory Visit (INDEPENDENT_AMBULATORY_CARE_PROVIDER_SITE_OTHER): Payer: Medicare HMO | Admitting: Family Medicine

## 2013-03-29 VITALS — BP 130/70 | HR 94 | Temp 97.7°F | Wt 107.0 lb

## 2013-03-29 DIAGNOSIS — Z23 Encounter for immunization: Secondary | ICD-10-CM

## 2013-03-29 DIAGNOSIS — E41 Nutritional marasmus: Secondary | ICD-10-CM

## 2013-03-29 DIAGNOSIS — E43 Unspecified severe protein-calorie malnutrition: Secondary | ICD-10-CM

## 2013-03-29 DIAGNOSIS — I10 Essential (primary) hypertension: Secondary | ICD-10-CM

## 2013-03-29 NOTE — Progress Notes (Signed)
  Subjective:    Patient ID: Colleen Cooke, female    DOB: 1934-08-11, 78 y.o.   MRN: [id number]  HPI Patient for medical followup. She has history of lymphoma which is currently in remission. She is followed closely by oncology. Recent labs including comprehensive and and metabolic panel and CBC reviewed. Her anemia has resolved. Her other chronic problems include history of hypertension, osteoporosis. She's had problems with weight loss and after starting Remeron she's had some steady weight gain -currently at 107 pounds which is 6 pounds up from last visit. She states her appetite is good. She is sleeping well.  She continues to have some thoracic back pain. History of compression fracture.  She takes low-dose gabapentin which she thinks helps. This was initially started by her oncologist  Past Medical History  Diagnosis Date  . Chicken pox   . 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 Non-Hodgkin's lymphoma, Burkitt's     Jan. 2013  . Back pain     pt has compression fractures in back  . Wears glasses   . Wears dentures     upper and lower  . Diffuse large B cell lymphoma 04/13/2012   Past Surgical History  Procedure Laterality Date  . Appendectomy      age 32  . Tonsillectomy      age 67  . Mass biopsy  04/04/2012    Procedure: NECK MASS BIOPSY;  Surgeon: Drema Halon, MD;  Location: [facility];  Service: ENT;  Laterality: Right;    reports that she quit smoking about 19 years ago. Her smoking use included Cigarettes. She has a 20 pack-year smoking history. She has never used smokeless tobacco. She reports that she drinks about 0.6 ounces of alcohol per week. She reports that she does not use illicit drugs. family history includes Coronary artery disease (age of onset: 29) in her mother. Allergies  Allergen Reactions  . Penicillins     REACTION: swelling localized      Review of Systems  Constitutional: Negative for fatigue.  Eyes: Negative for visual  disturbance.  Respiratory: Negative for cough, chest tightness, shortness of breath and wheezing.   Cardiovascular: Negative for chest pain, palpitations and leg swelling.  Musculoskeletal: Positive for back pain.  Neurological: Negative for dizziness, seizures, syncope, weakness, light-headedness and headaches.       Objective:   Physical Exam  Constitutional: She is oriented to person, place, and time. She appears well-developed and well-nourished.  Cardiovascular: Normal rate and regular rhythm.   Pulmonary/Chest: Effort normal and breath sounds normal. No respiratory distress. She has no wheezes. She has no rales.  Musculoskeletal: She exhibits no edema.  Neurological: She is alert and oriented to person, place, and time.          Assessment & Plan:  #1 poor weight gain. Improving with Remeron. We've recommended continue at least 6 more months. #2 health maintenance. Flu vaccine given. #3 history of hypertension currently stable off medications. Her peripheral edema has improved as her nutritional status and hypoalbuminemia have improved

## 2013-04-03 ENCOUNTER — Other Ambulatory Visit: Payer: Self-pay | Admitting: Hematology and Oncology

## 2013-04-04 ENCOUNTER — Telehealth: Payer: Self-pay | Admitting: *Deleted

## 2013-04-04 ENCOUNTER — Other Ambulatory Visit: Payer: Self-pay | Admitting: *Deleted

## 2013-04-04 NOTE — Telephone Encounter (Signed)
Pt called about chemo appt not being scheduled yet.  Informed her that Dr. Bertis Ruddy sent order for next Rituxan w/ next office visit on 11/19 and to expect call from Scheduler.  She verbalized understanding.

## 2013-04-05 ENCOUNTER — Telehealth: Payer: Self-pay | Admitting: *Deleted

## 2013-04-05 NOTE — Telephone Encounter (Signed)
Per staff message and POF I have scheduled appts.  JMW  

## 2013-04-09 ENCOUNTER — Telehealth: Payer: Self-pay | Admitting: Hematology and Oncology

## 2013-04-09 NOTE — Telephone Encounter (Signed)
s.w. pt and advised on 11.19.14 appt...pt ok and aware

## 2013-05-16 ENCOUNTER — Ambulatory Visit (HOSPITAL_BASED_OUTPATIENT_CLINIC_OR_DEPARTMENT_OTHER): Payer: Medicare HMO

## 2013-05-16 ENCOUNTER — Other Ambulatory Visit (HOSPITAL_BASED_OUTPATIENT_CLINIC_OR_DEPARTMENT_OTHER): Payer: Medicare HMO | Admitting: Lab

## 2013-05-16 ENCOUNTER — Telehealth: Payer: Self-pay | Admitting: Hematology and Oncology

## 2013-05-16 ENCOUNTER — Ambulatory Visit (HOSPITAL_BASED_OUTPATIENT_CLINIC_OR_DEPARTMENT_OTHER): Payer: Medicare HMO | Admitting: Hematology and Oncology

## 2013-05-16 VITALS — BP 148/74 | HR 93 | Temp 97.0°F | Resp 18 | Ht 60.0 in | Wt 110.5 lb

## 2013-05-16 VITALS — BP 122/52 | HR 83 | Temp 97.8°F | Resp 16

## 2013-05-16 DIAGNOSIS — C8589 Other specified types of non-Hodgkin lymphoma, extranodal and solid organ sites: Secondary | ICD-10-CM

## 2013-05-16 DIAGNOSIS — Z5112 Encounter for antineoplastic immunotherapy: Secondary | ICD-10-CM

## 2013-05-16 DIAGNOSIS — C8590 Non-Hodgkin lymphoma, unspecified, unspecified site: Secondary | ICD-10-CM

## 2013-05-16 DIAGNOSIS — C8330 Diffuse large B-cell lymphoma, unspecified site: Secondary | ICD-10-CM

## 2013-05-16 LAB — CBC WITH DIFFERENTIAL/PLATELET
Basophils Absolute: 0.1 10*3/uL (ref 0.0–0.1)
Eosinophils Absolute: 0.2 10*3/uL (ref 0.0–0.5)
HGB: 12.7 g/dL (ref 11.6–15.9)
MONO#: 0.9 10*3/uL (ref 0.1–0.9)
NEUT#: 4.1 10*3/uL (ref 1.5–6.5)
RBC: 3.85 10*6/uL (ref 3.70–5.45)
RDW: 14.4 % (ref 11.2–14.5)
WBC: 6 10*3/uL (ref 3.9–10.3)

## 2013-05-16 LAB — COMPREHENSIVE METABOLIC PANEL (CC13)
ALT: 10 U/L (ref 0–55)
AST: 20 U/L (ref 5–34)
Albumin: 3.8 g/dL (ref 3.5–5.0)
CO2: 27 mEq/L (ref 22–29)
Chloride: 106 mEq/L (ref 98–109)
Glucose: 66 mg/dl — ABNORMAL LOW (ref 70–140)
Potassium: 4.2 mEq/L (ref 3.5–5.1)
Sodium: 142 mEq/L (ref 136–145)
Total Bilirubin: 0.25 mg/dL (ref 0.20–1.20)

## 2013-05-16 MED ORDER — ACETAMINOPHEN 325 MG PO TABS
ORAL_TABLET | ORAL | Status: AC
  Filled 2013-05-16: qty 2

## 2013-05-16 MED ORDER — SODIUM CHLORIDE 0.9 % IV SOLN
375.0000 mg/m2 | Freq: Once | INTRAVENOUS | Status: AC
  Administered 2013-05-16: 500 mg via INTRAVENOUS
  Filled 2013-05-16: qty 50

## 2013-05-16 MED ORDER — SODIUM CHLORIDE 0.9 % IJ SOLN
10.0000 mL | INTRAMUSCULAR | Status: DC | PRN
  Administered 2013-05-16: 10 mL
  Filled 2013-05-16: qty 10

## 2013-05-16 MED ORDER — DIPHENHYDRAMINE HCL 25 MG PO CAPS
ORAL_CAPSULE | ORAL | Status: AC
  Filled 2013-05-16: qty 2

## 2013-05-16 MED ORDER — HEPARIN SOD (PORK) LOCK FLUSH 100 UNIT/ML IV SOLN
500.0000 [IU] | Freq: Once | INTRAVENOUS | Status: AC | PRN
  Administered 2013-05-16: 500 [IU]
  Filled 2013-05-16: qty 5

## 2013-05-16 MED ORDER — DIPHENHYDRAMINE HCL 25 MG PO CAPS
50.0000 mg | ORAL_CAPSULE | Freq: Once | ORAL | Status: AC
  Administered 2013-05-16: 50 mg via ORAL

## 2013-05-16 MED ORDER — SODIUM CHLORIDE 0.9 % IV SOLN
Freq: Once | INTRAVENOUS | Status: AC
  Administered 2013-05-16: 11:00:00 via INTRAVENOUS

## 2013-05-16 MED ORDER — ACETAMINOPHEN 325 MG PO TABS
650.0000 mg | ORAL_TABLET | Freq: Once | ORAL | Status: AC
  Administered 2013-05-16: 650 mg via ORAL

## 2013-05-16 NOTE — Progress Notes (Signed)
[facility] OFFICE PROGRESS NOTE  Patient Care Team: [name], MD as PCP - General  DIAGNOSIS: Recurrent lymphoma, on maintenance rituximab  SUMMARY OF ONCOLOGIC HISTORY: For history of stage IV, low grade but high risk follicular lymphoma: 3 cycles of Bendamustine/Rituxan between Jan 2013 and March 2013. According to the patient, she presented with a palpable lump. She stopped chemo due to severe pain from compression fracture and severe bone pain.  She developed transformed DLBCL and was treated with mini-RCHOP between 04/24/2012 and 09/04/2012.  She was in remission.   INTERVAL HISTORY: Colleen Cooke 78 y.o. female returns for 4th maintenance dose of rituximab. She denies any recent fever, chills, night sweats or abnormal weight loss Her appetite is very good. She denies any further back pain today.  I have reviewed the past medical history, past surgical history, social history and family history with the patient and they are unchanged from previous note.  ALLERGIES:  is allergic to penicillins.  MEDICATIONS:  Current Outpatient Prescriptions  Medication Sig Dispense Refill  . alendronate (FOSAMAX) 70 MG tablet Take 1 tablet (70 mg total) by mouth every Sunday. Take with a full glass of water on an empty stomach.  12 tablet  3  . Calcium Carbonate (CALCIUM 600 PO) Take 630 mg by mouth daily.      . cholecalciferol (VITAMIN D) 1000 UNITS tablet Take 1,000 Units by mouth daily.      . furosemide (LASIX) 20 MG tablet       . gabapentin (NEURONTIN) 100 MG capsule Take 1 capsule (100 mg total) by mouth daily.  90 capsule  3  . lidocaine-prilocaine (EMLA) cream Apply topically as needed. Put it on portacath one hour before access.  30 g  2  . mirtazapine (REMERON) 15 MG tablet Take 1 tablet (15 mg total) by mouth at bedtime.  90 tablet  3  . OVER THE COUNTER MEDICATION Take 1,000 mg by mouth daily. Emergency vitamin c powder       No current facility-administered  medications for this visit.    REVIEW OF SYSTEMS:   Constitutional: Denies fevers, chills or abnormal weight loss Eyes: Denies blurriness of vision Ears, nose, mouth, throat, and face: Denies mucositis or sore throat Respiratory: Denies cough, dyspnea or wheezes Cardiovascular: Denies palpitation, chest discomfort or lower extremity swelling Gastrointestinal:  Denies nausea, heartburn or change in bowel habits Skin: Denies abnormal skin rashes Lymphatics: Denies new lymphadenopathy or easy bruising Neurological:Denies numbness, tingling or new weaknesses Behavioral/Psych: Mood is stable, no new changes  All other systems were reviewed with the patient and are negative.  PHYSICAL EXAMINATION: ECOG PERFORMANCE STATUS: 0 - Asymptomatic  Filed Vitals:   05/16/13 0927  BP: 148/74  Pulse: 93  Temp: 97 F (36.1 C)  Resp: 18   Filed Weights   05/16/13 0927  Weight: 110 lb 8 oz (50.122 kg)    GENERAL:alert, no distress and comfortable. Patient looks thin SKIN: skin color, texture, turgor are normal, no rashes or significant lesions EYES: normal, Conjunctiva are pink and non-injected, sclera clear OROPHARYNX:no exudate, no erythema and lips, buccal mucosa, and tongue normal  NECK: supple, thyroid normal size, non-tender, without nodularity LYMPH:  no palpable lymphadenopathy in the cervical, axillary or inguinal LUNGS: clear to auscultation and percussion with normal breathing effort HEART: regular rate & rhythm and no murmurs and no lower extremity edema ABDOMEN:abdomen soft, non-tender and normal bowel sounds Musculoskeletal:no cyanosis of digits and no clubbing  NEURO: alert &  oriented x 3 with fluent speech, no focal motor/sensory deficits  LABORATORY DATA:  I have reviewed the data as listed    Component Value Date/Time   NA 140 03/16/2013 0817   NA 134* 07/06/2012 0700   K 4.3 03/16/2013 0817   K 4.3 07/06/2012 0700   CL 102 11/17/2012 0820   CL 102 07/06/2012 0700   CO2 28  03/16/2013 0817   CO2 28 07/06/2012 0700   GLUCOSE 97 03/16/2013 0817   GLUCOSE 98 11/17/2012 0820   GLUCOSE 95 07/06/2012 0700   BUN 12.7 03/16/2013 0817   BUN 6 07/06/2012 0700   CREATININE 0.8 03/16/2013 0817   CREATININE 0.52 07/06/2012 0700   CALCIUM 10.0 03/16/2013 0817   CALCIUM 9.0 07/06/2012 0700   PROT 6.4 03/16/2013 0817   PROT 5.0* 07/04/2012 0600   ALBUMIN 3.7 03/16/2013 0817   ALBUMIN 2.7* 07/04/2012 0600   AST 24 03/16/2013 0817   AST 9 07/04/2012 0600   ALT 13 03/16/2013 0817   ALT 17 07/04/2012 0600   ALKPHOS 85 03/16/2013 0817   ALKPHOS 102 07/04/2012 0600   BILITOT 0.39 03/16/2013 0817   BILITOT 0.7 07/04/2012 0600   GFRNONAA >90 07/06/2012 0700   GFRAA >90 07/06/2012 0700    No results found for this basename: SPEP,  UPEP,   kappa and lambda light chains    Lab Results  Component Value Date   WBC 6.0 05/16/2013   NEUTROABS 4.1 05/16/2013   HGB 12.7 05/16/2013   HCT 38.3 05/16/2013   MCV 99.5 05/16/2013   PLT 176 05/16/2013      Chemistry      Component Value Date/Time   NA 140 03/16/2013 0817   NA 134* 07/06/2012 0700   K 4.3 03/16/2013 0817   K 4.3 07/06/2012 0700   CL 102 11/17/2012 0820   CL 102 07/06/2012 0700   CO2 28 03/16/2013 0817   CO2 28 07/06/2012 0700   BUN 12.7 03/16/2013 0817   BUN 6 07/06/2012 0700   CREATININE 0.8 03/16/2013 0817   CREATININE 0.52 07/06/2012 0700      Component Value Date/Time   CALCIUM 10.0 03/16/2013 0817   CALCIUM 9.0 07/06/2012 0700   ALKPHOS 85 03/16/2013 0817   ALKPHOS 102 07/04/2012 0600   AST 24 03/16/2013 0817   AST 9 07/04/2012 0600   ALT 13 03/16/2013 0817   ALT 17 07/04/2012 0600   BILITOT 0.39 03/16/2013 0817   BILITOT 0.7 07/04/2012 0600     ASSESSMENT & PLAN:  #1 recurrent lymphoma We will proceed with rituximab today. Our recommendation would be to continue treatment every other month for a total of 2 years. We will only proceed with further imaging study if there is clinical suspicion of disease recurrence. Clinically there's no evidence of  disease recurrence #2 history of compression fracture We'll continue calcium, vitamin D, as well as Fosamax  Orders Placed This Encounter  Procedures  . CBC with Differential    Standing Status: Future     Number of Occurrences:      Standing Expiration Date: 02/05/2014  . Comprehensive metabolic panel    Standing Status: Future     Number of Occurrences:      Standing Expiration Date: 05/16/2014  . Lactate dehydrogenase    Standing Status: Future     Number of Occurrences:      Standing Expiration Date: 05/16/2014   All questions were answered. The patient knows to call the clinic with any problems,  questions or concerns. No barriers to learning was detected.    Doroteo Nickolson, MD 05/16/2013 9:38 AM

## 2013-05-16 NOTE — Patient Instructions (Signed)
[facility] Discharge Instructions for Patients Receiving Chemotherapy  Today you received the following chemotherapy agents: Rituxan. To help prevent nausea and vomiting after your treatment, we encourage you to take your nausea medication.   If you develop nausea and vomiting that is not controlled by your nausea medication, call the clinic.   BELOW ARE SYMPTOMS THAT SHOULD BE REPORTED IMMEDIATELY:  *FEVER GREATER THAN 100.5 F  *CHILLS WITH OR WITHOUT FEVER  NAUSEA AND VOMITING THAT IS NOT CONTROLLED WITH YOUR NAUSEA MEDICATION  *UNUSUAL SHORTNESS OF BREATH  *UNUSUAL BRUISING OR BLEEDING  TENDERNESS IN MOUTH AND THROAT WITH OR WITHOUT PRESENCE OF ULCERS  *URINARY PROBLEMS  *BOWEL PROBLEMS  UNUSUAL RASH Items with * indicate a potential emergency and should be followed up as soon as possible.  Feel free to call the clinic you have any questions or concerns. The clinic phone number is [phone].    

## 2013-05-16 NOTE — Telephone Encounter (Signed)
gv and printed papt sched andvs for pt for Jan 2015

## 2013-05-29 ENCOUNTER — Ambulatory Visit: Payer: Medicare HMO | Admitting: Hematology and Oncology

## 2013-05-29 ENCOUNTER — Other Ambulatory Visit: Payer: Medicare HMO

## 2013-07-11 ENCOUNTER — Ambulatory Visit (HOSPITAL_BASED_OUTPATIENT_CLINIC_OR_DEPARTMENT_OTHER): Payer: Medicare HMO | Admitting: Hematology and Oncology

## 2013-07-11 ENCOUNTER — Telehealth: Payer: Self-pay | Admitting: Hematology and Oncology

## 2013-07-11 ENCOUNTER — Ambulatory Visit (HOSPITAL_BASED_OUTPATIENT_CLINIC_OR_DEPARTMENT_OTHER): Payer: Medicare HMO

## 2013-07-11 ENCOUNTER — Encounter: Payer: Self-pay | Admitting: Hematology and Oncology

## 2013-07-11 ENCOUNTER — Other Ambulatory Visit (HOSPITAL_BASED_OUTPATIENT_CLINIC_OR_DEPARTMENT_OTHER): Payer: Medicare HMO

## 2013-07-11 VITALS — BP 108/41 | HR 83 | Temp 98.3°F | Resp 16

## 2013-07-11 VITALS — BP 119/59 | HR 94 | Temp 97.8°F | Resp 18 | Ht 60.0 in | Wt 112.2 lb

## 2013-07-11 DIAGNOSIS — C8589 Other specified types of non-Hodgkin lymphoma, extranodal and solid organ sites: Secondary | ICD-10-CM

## 2013-07-11 DIAGNOSIS — C8330 Diffuse large B-cell lymphoma, unspecified site: Secondary | ICD-10-CM

## 2013-07-11 DIAGNOSIS — Z5112 Encounter for antineoplastic immunotherapy: Secondary | ICD-10-CM

## 2013-07-11 DIAGNOSIS — C8590 Non-Hodgkin lymphoma, unspecified, unspecified site: Secondary | ICD-10-CM

## 2013-07-11 LAB — CBC WITH DIFFERENTIAL/PLATELET
BASO%: 1 % (ref 0.0–2.0)
BASOS ABS: 0.1 10*3/uL (ref 0.0–0.1)
EOS ABS: 0.1 10*3/uL (ref 0.0–0.5)
EOS%: 2.1 % (ref 0.0–7.0)
HCT: 38.5 % (ref 34.8–46.6)
HEMOGLOBIN: 12.9 g/dL (ref 11.6–15.9)
LYMPH%: 17 % (ref 14.0–49.7)
MCH: 33 pg (ref 25.1–34.0)
MCHC: 33.5 g/dL (ref 31.5–36.0)
MCV: 98.5 fL (ref 79.5–101.0)
MONO#: 0.5 10*3/uL (ref 0.1–0.9)
MONO%: 10.2 % (ref 0.0–14.0)
NEUT%: 69.7 % (ref 38.4–76.8)
NEUTROS ABS: 3.6 10*3/uL (ref 1.5–6.5)
PLATELETS: 204 10*3/uL (ref 145–400)
RBC: 3.91 10*6/uL (ref 3.70–5.45)
RDW: 13.4 % (ref 11.2–14.5)
WBC: 5.2 10*3/uL (ref 3.9–10.3)
lymph#: 0.9 10*3/uL (ref 0.9–3.3)
nRBC: 0 % (ref 0–0)

## 2013-07-11 LAB — COMPREHENSIVE METABOLIC PANEL (CC13)
ALT: 11 U/L (ref 0–55)
ANION GAP: 10 meq/L (ref 3–11)
AST: 21 U/L (ref 5–34)
Albumin: 3.8 g/dL (ref 3.5–5.0)
Alkaline Phosphatase: 92 U/L (ref 40–150)
BUN: 13.3 mg/dL (ref 7.0–26.0)
CO2: 25 meq/L (ref 22–29)
CREATININE: 0.8 mg/dL (ref 0.6–1.1)
Calcium: 10.4 mg/dL (ref 8.4–10.4)
Chloride: 106 mEq/L (ref 98–109)
GLUCOSE: 72 mg/dL (ref 70–140)
Potassium: 4.2 mEq/L (ref 3.5–5.1)
Sodium: 141 mEq/L (ref 136–145)
Total Bilirubin: 0.33 mg/dL (ref 0.20–1.20)
Total Protein: 6.7 g/dL (ref 6.4–8.3)

## 2013-07-11 LAB — LACTATE DEHYDROGENASE (CC13): LDH: 329 U/L — AB (ref 125–245)

## 2013-07-11 MED ORDER — HEPARIN SOD (PORK) LOCK FLUSH 100 UNIT/ML IV SOLN
500.0000 [IU] | Freq: Once | INTRAVENOUS | Status: AC | PRN
  Administered 2013-07-11: 500 [IU]
  Filled 2013-07-11: qty 5

## 2013-07-11 MED ORDER — SODIUM CHLORIDE 0.9 % IV SOLN
375.0000 mg/m2 | Freq: Once | INTRAVENOUS | Status: AC
  Administered 2013-07-11: 500 mg via INTRAVENOUS
  Filled 2013-07-11: qty 50

## 2013-07-11 MED ORDER — DIPHENHYDRAMINE HCL 25 MG PO CAPS
ORAL_CAPSULE | ORAL | Status: AC
  Filled 2013-07-11: qty 2

## 2013-07-11 MED ORDER — ACETAMINOPHEN 325 MG PO TABS
650.0000 mg | ORAL_TABLET | Freq: Once | ORAL | Status: AC
  Administered 2013-07-11: 650 mg via ORAL

## 2013-07-11 MED ORDER — DIPHENHYDRAMINE HCL 25 MG PO CAPS
50.0000 mg | ORAL_CAPSULE | Freq: Once | ORAL | Status: AC
  Administered 2013-07-11: 50 mg via ORAL

## 2013-07-11 MED ORDER — DIPHENHYDRAMINE HCL 50 MG/ML IJ SOLN
INTRAMUSCULAR | Status: AC
  Filled 2013-07-11: qty 1

## 2013-07-11 MED ORDER — SODIUM CHLORIDE 0.9 % IV SOLN
Freq: Once | INTRAVENOUS | Status: AC
  Administered 2013-07-11: 10:00:00 via INTRAVENOUS

## 2013-07-11 MED ORDER — SODIUM CHLORIDE 0.9 % IJ SOLN
10.0000 mL | INTRAMUSCULAR | Status: DC | PRN
  Administered 2013-07-11: 10 mL
  Filled 2013-07-11: qty 10

## 2013-07-11 MED ORDER — ACETAMINOPHEN 325 MG PO TABS
ORAL_TABLET | ORAL | Status: AC
  Filled 2013-07-11: qty 2

## 2013-07-11 NOTE — Telephone Encounter (Signed)
gv and printed appt sched nad avs for pt for March...sed added tx...appt made 58mths from 1.14.15. per written pof

## 2013-07-11 NOTE — Progress Notes (Signed)
[facility] OFFICE PROGRESS NOTE  Patient Care Team: Eulas Post, MD as PCP - General Heath Lark, MD as Consulting Physician (Hematology and Oncology)  DIAGNOSIS: Recurrent lymphoma, on maintenance rituximab  SUMMARY OF ONCOLOGIC HISTORY: Oncology History   .Lymphoma, follicular subtype   Primary site: Lymphoid Neoplasms (Bilateral)   Staging method: AJCC 6th Edition   Clinical: Stage IV (positiv pleural effusion)   Pathologic: Stage IV signed by Heath Lark, MD on 05/16/2013  9:45 AM   Summary: Stage IV  Diffuse large B cell lymphoma, relapsed from transformation   Primary site: Lymphoid Neoplasms (Right)   Staging method: AJCC 6th Edition   Clinical: Stage II signed by Heath Lark, MD on 05/16/2013 10:18 AM   Pathologic: Stage II signed by Heath Lark, MD on 05/16/2013 10:18 AM   Summary: Stage II       Lymphoma   06/09/2011 Procedure LN biopsy confirmed low grade follicular lymphoma   0/12/1217 Initial Diagnosis Lymphoma   07/06/2011 Bone Marrow Biopsy BM was negative   07/08/2011 Imaging Hypermetabolic lymphadenopathy in the neck, chest, abdomen, and pelvis,as well as pleural effusion   07/21/2011 - 09/17/2011 Chemotherapy Patient received 3 cycles of Bendamustine/Rituximab. Chemotherapy was stopped due to new palpable lump and compression fractures with bone pain   10/12/2011 Imaging PET/CT scan confirmed complete response   03/09/2012 Relapse/Recurrence Patient felt an enlarge LN in her neck   03/28/2012 Imaging Repeat PET/CT showed High-grade lymphoma recurrence with intensely hypermetabolic lymph nodes in the right neck.      04/04/2012 Procedure LN biopsy confirmed transformation to DLBCL   04/14/2012 Bone Marrow Biopsy BM biopsy was negative   04/24/2012 - 09/04/2012 Chemotherapy She completed 5 cycles of mini-RCHOP   09/15/2012 Imaging Repeat PET/CT showed resolved adenopathy in the neck.  No current abnormal extraosseous hypermetabolic activity suspicious for  tumor.    11/17/2012 -  Chemotherapy Patient is started on maintenance Rituximab every other month    INTERVAL HISTORY: [facility] Co[facility] 79 y.o. female returns for further followup. She is doing very well. Denies any palpable lymphadenopathy. She denies any recent fever, chills, night sweats or abnormal weight loss She denies any new back pain. I have reviewed the past medical history, past surgical history, social history and family history with the patient and they are unchanged from previous note.  ALLERGIES:  is allergic to penicillins.  MEDICATIONS:  Current Outpatient Prescriptions  Medication Sig Dispense Refill  . alendronate (FOSAMAX) 70 MG tablet Take 1 tablet (70 mg total) by mouth every Sunday. Take with a full glass of water on an empty stomach.  12 tablet  3  . Calcium Carbonate (CALCIUM 600 PO) Take 630 mg by mouth daily.      . cholecalciferol (VITAMIN D) 1000 UNITS tablet Take 1,000 Units by mouth daily.      . furosemide (LASIX) 20 MG tablet       . gabapentin (NEURONTIN) 100 MG capsule Take 1 capsule (100 mg total) by mouth daily.  90 capsule  3  . lidocaine-prilocaine (EMLA) cream Apply topically as needed. Put it on portacath one hour before access.  30 g  2  . mirtazapine (REMERON) 15 MG tablet Take 1 tablet (15 mg total) by mouth at bedtime.  90 tablet  3  . OVER THE COUNTER MEDICATION Take 1,000 mg by mouth daily. Emergency vitamin c powder       No current facility-administered medications for this visit.    REVIEW OF SYSTEMS:  Constitutional: Denies fevers, chills or abnormal weight loss Eyes: Denies blurriness of vision Ears, nose, mouth, throat, and face: Denies mucositis or sore throat Respiratory: Denies cough, dyspnea or wheezes Cardiovascular: Denies palpitation, chest discomfort or lower extremity swelling Gastrointestinal:  Denies nausea, heartburn or change in bowel habits Skin: Denies abnormal skin rashes Lymphatics: Denies new lymphadenopathy or  easy bruising Neurological:Denies numbness, tingling or new weaknesses Behavioral/Psych: Mood is stable, no new changes  All other systems were reviewed with the patient and are negative.  PHYSICAL EXAMINATION: ECOG PERFORMANCE STATUS: 0 - Asymptomatic  Filed Vitals:   07/11/13 0920  BP: 119/59  Pulse: 94  Temp: 97.8 F (36.6 C)  Resp: 18   Filed Weights   07/11/13 0920  Weight: 112 lb 3.2 oz (50.894 kg)    GENERAL:alert, no distress and comfortable. She looks thin and mildly cachectic SKIN: skin color, texture, turgor are normal, no rashes or significant lesions EYES: normal, Conjunctiva are pink and non-injected, sclera clear OROPHARYNX:no exudate, no erythema and lips, buccal mucosa, and tongue normal  NECK: supple, thyroid normal size, non-tender, without nodularity LYMPH:  no palpable lymphadenopathy in the cervical, axillary or inguinal LUNGS: clear to auscultation and percussion with normal breathing effort HEART: regular rate & rhythm and no murmurs and no lower extremity edema ABDOMEN:abdomen soft, non-tender and normal bowel sounds Musculoskeletal:no cyanosis of digits and no clubbing  NEURO: alert & oriented x 3 with fluent speech, no focal motor/sensory deficits  LABORATORY DATA:  I have reviewed the data as listed    Component Value Date/Time   NA 142 05/16/2013 0914   NA 134* 07/06/2012 0700   K 4.2 05/16/2013 0914   K 4.3 07/06/2012 0700   CL 102 11/17/2012 0820   CL 102 07/06/2012 0700   CO2 27 05/16/2013 0914   CO2 28 07/06/2012 0700   GLUCOSE 66* 05/16/2013 0914   GLUCOSE 98 11/17/2012 0820   GLUCOSE 95 07/06/2012 0700   BUN 14.4 05/16/2013 0914   BUN 6 07/06/2012 0700   CREATININE 0.7 05/16/2013 0914   CREATININE 0.52 07/06/2012 0700   CALCIUM 10.7* 05/16/2013 0914   CALCIUM 9.0 07/06/2012 0700   PROT 6.2* 05/16/2013 0914   PROT 5.0* 07/04/2012 0600   ALBUMIN 3.8 05/16/2013 0914   ALBUMIN 2.7* 07/04/2012 0600   AST 20 05/16/2013 0914   AST 9 07/04/2012 0600    ALT 10 05/16/2013 0914   ALT 17 07/04/2012 0600   ALKPHOS 87 05/16/2013 0914   ALKPHOS 102 07/04/2012 0600   BILITOT 0.25 05/16/2013 0914   BILITOT 0.7 07/04/2012 0600   GFRNONAA >90 07/06/2012 0700   GFRAA >90 07/06/2012 0700    No results found for this basename: SPEP,  UPEP,   kappa and lambda light chains    Lab Results  Component Value Date   WBC 5.2 07/11/2013   NEUTROABS 3.6 07/11/2013   HGB 12.9 07/11/2013   HCT 38.5 07/11/2013   MCV 98.5 07/11/2013   PLT 204 07/11/2013      Chemistry      Component Value Date/Time   NA 142 05/16/2013 0914   NA 134* 07/06/2012 0700   K 4.2 05/16/2013 0914   K 4.3 07/06/2012 0700   CL 102 11/17/2012 0820   CL 102 07/06/2012 0700   CO2 27 05/16/2013 0914   CO2 28 07/06/2012 0700   BUN 14.4 05/16/2013 0914   BUN 6 07/06/2012 0700   CREATININE 0.7 05/16/2013 0914   CREATININE 0.52 07/06/2012 0700  Component Value Date/Time   CALCIUM 10.7* 05/16/2013 0914   CALCIUM 9.0 07/06/2012 0700   ALKPHOS 87 05/16/2013 0914   ALKPHOS 102 07/04/2012 0600   AST 20 05/16/2013 0914   AST 9 07/04/2012 0600   ALT 10 05/16/2013 0914   ALT 17 07/04/2012 0600   BILITOT 0.25 05/16/2013 0914   BILITOT 0.7 07/04/2012 0600     ASSESSMENT & PLAN:  #1 recurrent lymphoma We will proceed with rituximab today. Our recommendation would be to continue treatment every other month for a total of 2 years. We will only proceed with further imaging study if there is clinical suspicion of disease recurrence. Clinically there's no evidence of disease recurrence #2 history of compression fracture We'll continue calcium, vitamin D, as well as Fosamax #3 recent hypercalcemia, resolved I suspect this is due to mild dehydration it has resolved.  Orders Placed This Encounter  Procedures  . CBC with Differential    Standing Status: Future     Number of Occurrences:      Standing Expiration Date: 04/02/2014  . Comprehensive metabolic panel    Standing Status: Future     Number of  Occurrences:      Standing Expiration Date: 07/11/2014  . Lactate dehydrogenase    Standing Status: Future     Number of Occurrences:      Standing Expiration Date: 07/11/2014   All questions were answered. The patient knows to call the clinic with any problems, questions or concerns. No barriers to learning was detected. I spent 25 minutes counseling the patient face to face. The total time spent in the appointment was 40 minutes and more than 50% was on counseling and review of test results     [facility], [place], MD 07/11/2013 9:39 AM

## 2013-07-11 NOTE — Patient Instructions (Signed)
[facility] Discharge Instructions for Patients Receiving Chemotherapy  Today you received the following chemotherapy agents Rituxan.   To help prevent nausea and vomiting after your treatment, we encourage you to take your nausea medication as needed.    If you develop nausea and vomiting that is not controlled by your nausea medication, call the clinic.   BELOW ARE SYMPTOMS THAT SHOULD BE REPORTED IMMEDIATELY:  *FEVER GREATER THAN 100.5 F  *CHILLS WITH OR WITHOUT FEVER  NAUSEA AND VOMITING THAT IS NOT CONTROLLED WITH YOUR NAUSEA MEDICATION  *UNUSUAL SHORTNESS OF BREATH  *UNUSUAL BRUISING OR BLEEDING  TENDERNESS IN MOUTH AND THROAT WITH OR WITHOUT PRESENCE OF ULCERS  *URINARY PROBLEMS  *BOWEL PROBLEMS  UNUSUAL RASH Items with * indicate a potential emergency and should be followed up as soon as possible.  Feel free to call the clinic you have any questions or concerns. The clinic phone number is [phone].  It was my pleasure to take care of you today!  Leeanne Rio, RN

## 2013-08-21 ENCOUNTER — Telehealth: Payer: Self-pay | Admitting: Family Medicine

## 2013-08-21 MED ORDER — ALENDRONATE SODIUM 70 MG PO TABS: 70.0000 mg | ORAL_TABLET | ORAL | Status: DC

## 2013-08-21 NOTE — Telephone Encounter (Signed)
Pt is needing new rx alendronate (FOSAMAX) 70 MG tablet, sent to [facility].

## 2013-08-21 NOTE — Telephone Encounter (Signed)
Pt notified Rx sent to [facility].

## 2013-09-05 ENCOUNTER — Other Ambulatory Visit (HOSPITAL_BASED_OUTPATIENT_CLINIC_OR_DEPARTMENT_OTHER): Payer: Medicare HMO

## 2013-09-05 ENCOUNTER — Ambulatory Visit (HOSPITAL_BASED_OUTPATIENT_CLINIC_OR_DEPARTMENT_OTHER): Payer: Medicare HMO

## 2013-09-05 ENCOUNTER — Encounter: Payer: Self-pay | Admitting: Hematology and Oncology

## 2013-09-05 ENCOUNTER — Telehealth: Payer: Self-pay | Admitting: Hematology and Oncology

## 2013-09-05 ENCOUNTER — Ambulatory Visit (HOSPITAL_BASED_OUTPATIENT_CLINIC_OR_DEPARTMENT_OTHER): Payer: Medicare HMO | Admitting: Hematology and Oncology

## 2013-09-05 VITALS — BP 136/57 | HR 98 | Temp 98.1°F | Resp 18 | Ht 60.0 in | Wt 116.6 lb

## 2013-09-05 VITALS — BP 120/37 | HR 90 | Temp 97.9°F | Resp 20

## 2013-09-05 DIAGNOSIS — C8330 Diffuse large B-cell lymphoma, unspecified site: Secondary | ICD-10-CM

## 2013-09-05 DIAGNOSIS — C8590 Non-Hodgkin lymphoma, unspecified, unspecified site: Secondary | ICD-10-CM

## 2013-09-05 DIAGNOSIS — C8299 Follicular lymphoma, unspecified, extranodal and solid organ sites: Secondary | ICD-10-CM

## 2013-09-05 DIAGNOSIS — Z5112 Encounter for antineoplastic immunotherapy: Secondary | ICD-10-CM

## 2013-09-05 LAB — CBC WITH DIFFERENTIAL/PLATELET
BASO%: 0.9 % (ref 0.0–2.0)
Basophils Absolute: 0 10*3/uL (ref 0.0–0.1)
EOS ABS: 0.2 10*3/uL (ref 0.0–0.5)
EOS%: 3.5 % (ref 0.0–7.0)
HCT: 38.1 % (ref 34.8–46.6)
HEMOGLOBIN: 12.9 g/dL (ref 11.6–15.9)
LYMPH%: 16.5 % (ref 14.0–49.7)
MCH: 33 pg (ref 25.1–34.0)
MCHC: 33.9 g/dL (ref 31.5–36.0)
MCV: 97.4 fL (ref 79.5–101.0)
MONO#: 0.7 10*3/uL (ref 0.1–0.9)
MONO%: 15.5 % — ABNORMAL HIGH (ref 0.0–14.0)
NEUT%: 63.6 % (ref 38.4–76.8)
NEUTROS ABS: 2.7 10*3/uL (ref 1.5–6.5)
NRBC: 0 % (ref 0–0)
Platelets: 165 10*3/uL (ref 145–400)
RBC: 3.91 10*6/uL (ref 3.70–5.45)
RDW: 13.6 % (ref 11.2–14.5)
WBC: 4.3 10*3/uL (ref 3.9–10.3)
lymph#: 0.7 10*3/uL — ABNORMAL LOW (ref 0.9–3.3)

## 2013-09-05 LAB — COMPREHENSIVE METABOLIC PANEL (CC13)
ALBUMIN: 3.8 g/dL (ref 3.5–5.0)
ALT: 12 U/L (ref 0–55)
AST: 21 U/L (ref 5–34)
Alkaline Phosphatase: 88 U/L (ref 40–150)
Anion Gap: 9 mEq/L (ref 3–11)
BILIRUBIN TOTAL: 0.3 mg/dL (ref 0.20–1.20)
BUN: 13.8 mg/dL (ref 7.0–26.0)
CO2: 27 meq/L (ref 22–29)
Calcium: 10.4 mg/dL (ref 8.4–10.4)
Chloride: 106 mEq/L (ref 98–109)
Creatinine: 0.7 mg/dL (ref 0.6–1.1)
GLUCOSE: 75 mg/dL (ref 70–140)
POTASSIUM: 4.1 meq/L (ref 3.5–5.1)
SODIUM: 142 meq/L (ref 136–145)
TOTAL PROTEIN: 6.2 g/dL — AB (ref 6.4–8.3)

## 2013-09-05 LAB — LACTATE DEHYDROGENASE (CC13): LDH: 300 U/L — ABNORMAL HIGH (ref 125–245)

## 2013-09-05 MED ORDER — SODIUM CHLORIDE 0.9 % IJ SOLN
10.0000 mL | INTRAMUSCULAR | Status: DC | PRN
  Administered 2013-09-05: 10 mL
  Filled 2013-09-05: qty 10

## 2013-09-05 MED ORDER — ACETAMINOPHEN 325 MG PO TABS
650.0000 mg | ORAL_TABLET | Freq: Once | ORAL | Status: AC
  Administered 2013-09-05: 650 mg via ORAL

## 2013-09-05 MED ORDER — DIPHENHYDRAMINE HCL 25 MG PO CAPS
ORAL_CAPSULE | ORAL | Status: AC
  Filled 2013-09-05: qty 2

## 2013-09-05 MED ORDER — SODIUM CHLORIDE 0.9 % IV SOLN
Freq: Once | INTRAVENOUS | Status: AC
  Administered 2013-09-05: 10:00:00 via INTRAVENOUS

## 2013-09-05 MED ORDER — SODIUM CHLORIDE 0.9 % IV SOLN
375.0000 mg/m2 | Freq: Once | INTRAVENOUS | Status: AC
  Administered 2013-09-05: 500 mg via INTRAVENOUS
  Filled 2013-09-05: qty 50

## 2013-09-05 MED ORDER — DIPHENHYDRAMINE HCL 25 MG PO CAPS
50.0000 mg | ORAL_CAPSULE | Freq: Once | ORAL | Status: AC
  Administered 2013-09-05: 50 mg via ORAL

## 2013-09-05 MED ORDER — ACETAMINOPHEN 325 MG PO TABS
ORAL_TABLET | ORAL | Status: AC
  Filled 2013-09-05: qty 2

## 2013-09-05 MED ORDER — HEPARIN SOD (PORK) LOCK FLUSH 100 UNIT/ML IV SOLN
500.0000 [IU] | Freq: Once | INTRAVENOUS | Status: AC | PRN
Start: 2013-09-05 — End: 2013-09-05
  Administered 2013-09-05: 500 [IU]
  Filled 2013-09-05: qty 5

## 2013-09-05 NOTE — Patient Instructions (Signed)
[facility] Discharge Instructions for Patients Receiving Chemotherapy  Today you received the following chemotherapy agents RITUXAN  To help prevent nausea and vomiting after your treatment, we encourage you to take your nausea medication IF NEEDED.   If you develop nausea and vomiting that is not controlled by your nausea medication, call the clinic.   BELOW ARE SYMPTOMS THAT SHOULD BE REPORTED IMMEDIATELY:  *FEVER GREATER THAN 100.5 F  *CHILLS WITH OR WITHOUT FEVER  NAUSEA AND VOMITING THAT IS NOT CONTROLLED WITH YOUR NAUSEA MEDICATION  *UNUSUAL SHORTNESS OF BREATH  *UNUSUAL BRUISING OR BLEEDING  TENDERNESS IN MOUTH AND THROAT WITH OR WITHOUT PRESENCE OF ULCERS  *URINARY PROBLEMS  *BOWEL PROBLEMS  UNUSUAL RASH Items with * indicate a potential emergency and should be followed up as soon as possible.  Feel free to call the clinic you have any questions or concerns. The clinic phone number is [phone].

## 2013-09-05 NOTE — Telephone Encounter (Signed)
Gave pt appt for lab and MD for MAy 2015 °

## 2013-09-05 NOTE — Progress Notes (Signed)
[facility] OFFICE PROGRESS NOTE  Patient Care Team: Eulas Post, MD as PCP - General Heath Lark, MD as Consulting Physician (Hematology and Oncology)  DIAGNOSIS: Follicular lymphoma, on maintenance treatment  SUMMARY OF ONCOLOGIC HISTORY: Oncology History   .Lymphoma, follicular subtype   Primary site: Lymphoid Neoplasms (Bilateral)   Staging method: AJCC 6th Edition   Clinical: Stage IV (positiv pleural effusion)   Pathologic: Stage IV signed by Heath Lark, MD on 05/16/2013  9:45 AM   Summary: Stage IV  Diffuse large B cell lymphoma, relapsed from transformation   Primary site: Lymphoid Neoplasms (Right)   Staging method: AJCC 6th Edition   Clinical: Stage II signed by Heath Lark, MD on 05/16/2013 10:18 AM   Pathologic: Stage II signed by Heath Lark, MD on 05/16/2013 10:18 AM   Summary: Stage II       Lymphoma   06/09/2011 Procedure LN biopsy confirmed low grade follicular lymphoma   01/03/9380 Initial Diagnosis Lymphoma   07/06/2011 Bone Marrow Biopsy BM was negative   07/08/2011 Imaging Hypermetabolic lymphadenopathy in the neck, chest, abdomen, and pelvis,as well as pleural effusion   07/21/2011 - 09/17/2011 Chemotherapy Patient received 3 cycles of Bendamustine/Rituximab. Chemotherapy was stopped due to new palpable lump and compression fractures with bone pain   10/12/2011 Imaging PET/CT scan confirmed complete response   03/09/2012 Relapse/Recurrence Patient felt an enlarge LN in her neck   03/28/2012 Imaging Repeat PET/CT showed High-grade lymphoma recurrence with intensely hypermetabolic lymph nodes in the right neck.      04/04/2012 Procedure LN biopsy confirmed transformation to DLBCL   04/14/2012 Bone Marrow Biopsy BM biopsy was negative   04/24/2012 - 09/04/2012 Chemotherapy She completed 5 cycles of mini-RCHOP   09/15/2012 Imaging Repeat PET/CT showed resolved adenopathy in the neck.  No current abnormal extraosseous hypermetabolic activity suspicious for  tumor.    11/17/2012 -  Chemotherapy Patient is started on maintenance Rituximab every other month    INTERVAL HISTORY: Colleen Cooke 79 y.o. female returns for further followup. She denies any new symptoms. She denies any recent fever, chills, night sweats or abnormal weight loss No new lymphadenopathy.  I have reviewed the past medical history, past surgical history, social history and family history with the patient and they are unchanged from previous note.  ALLERGIES:  is allergic to penicillins.  MEDICATIONS:  Current Outpatient Prescriptions  Medication Sig Dispense Refill  . alendronate (FOSAMAX) 70 MG tablet Take 1 tablet (70 mg total) by mouth every Sunday. Take with a full glass of water on an empty stomach.  12 tablet  3  . Calcium Carbonate (CALCIUM 600 PO) Take 630 mg by mouth daily.      . cholecalciferol (VITAMIN D) 1000 UNITS tablet Take 1,000 Units by mouth daily.      . furosemide (LASIX) 20 MG tablet       . gabapentin (NEURONTIN) 100 MG capsule Take 1 capsule (100 mg total) by mouth daily.  90 capsule  3  . lidocaine-prilocaine (EMLA) cream Apply topically as needed. Put it on portacath one hour before access.  30 g  2  . mirtazapine (REMERON) 15 MG tablet Take 1 tablet (15 mg total) by mouth at bedtime.  90 tablet  3  . OVER THE COUNTER MEDICATION Take 1,000 mg by mouth daily. Emergency vitamin c powder       No current facility-administered medications for this visit.    REVIEW OF SYSTEMS:   Constitutional: Denies fevers, chills or  abnormal weight loss Eyes: Denies blurriness of vision Ears, nose, mouth, throat, and face: Denies mucositis or sore throat Respiratory: Denies cough, dyspnea or wheezes Cardiovascular: Denies palpitation, chest discomfort or lower extremity swelling Gastrointestinal:  Denies nausea, heartburn or change in bowel habits Skin: Denies abnormal skin rashes Lymphatics: Denies new lymphadenopathy or easy bruising Neurological:Denies  numbness, tingling or new weaknesses Behavioral/Psych: Mood is stable, no new changes  All other systems were reviewed with the patient and are negative.  PHYSICAL EXAMINATION: ECOG PERFORMANCE STATUS: 0 - Asymptomatic  Filed Vitals:   09/05/13 0907  BP: 136/57  Pulse: 98  Temp: 98.1 F (36.7 C)  Resp: 18   Filed Weights   09/05/13 0907  Weight: 116 lb 9.6 oz (52.889 kg)    GENERAL:alert, no distress and comfortable SKIN: skin color, texture, turgor are normal, no rashes or significant lesions EYES: normal, Conjunctiva are pink and non-injected, sclera clear OROPHARYNX:no exudate, no erythema and lips, buccal mucosa, and tongue normal  NECK: supple, thyroid normal size, non-tender, without nodularity LYMPH:  no palpable lymphadenopathy in the cervical, axillary or inguinal LUNGS: clear to auscultation and percussion with normal breathing effort HEART: regular rate & rhythm and no murmurs and no lower extremity edema ABDOMEN:abdomen soft, non-tender and normal bowel sounds Musculoskeletal:no cyanosis of digits and no clubbing  NEURO: alert & oriented x 3 with fluent speech, no focal motor/sensory deficits  LABORATORY DATA:  I have reviewed the data as listed    Component Value Date/Time   NA 141 07/11/2013 0911   NA 134* 07/06/2012 0700   K 4.2 07/11/2013 0911   K 4.3 07/06/2012 0700   CL 102 11/17/2012 0820   CL 102 07/06/2012 0700   CO2 25 07/11/2013 0911   CO2 28 07/06/2012 0700   GLUCOSE 72 07/11/2013 0911   GLUCOSE 98 11/17/2012 0820   GLUCOSE 95 07/06/2012 0700   BUN 13.3 07/11/2013 0911   BUN 6 07/06/2012 0700   CREATININE 0.8 07/11/2013 0911   CREATININE 0.52 07/06/2012 0700   CALCIUM 10.4 07/11/2013 0911   CALCIUM 9.0 07/06/2012 0700   PROT 6.7 07/11/2013 0911   PROT 5.0* 07/04/2012 0600   ALBUMIN 3.8 07/11/2013 0911   ALBUMIN 2.7* 07/04/2012 0600   AST 21 07/11/2013 0911   AST 9 07/04/2012 0600   ALT 11 07/11/2013 0911   ALT 17 07/04/2012 0600   ALKPHOS 92 07/11/2013 0911   ALKPHOS  102 07/04/2012 0600   BILITOT 0.33 07/11/2013 0911   BILITOT 0.7 07/04/2012 0600   GFRNONAA >90 07/06/2012 0700   GFRAA >90 07/06/2012 0700    No results found for this basename: SPEP,  UPEP,   kappa and lambda light chains    Lab Results  Component Value Date   WBC 4.3 09/05/2013   NEUTROABS 2.7 09/05/2013   HGB 12.9 09/05/2013   HCT 38.1 09/05/2013   MCV 97.4 09/05/2013   PLT 165 09/05/2013      Chemistry      Component Value Date/Time   NA 141 07/11/2013 0911   NA 134* 07/06/2012 0700   K 4.2 07/11/2013 0911   K 4.3 07/06/2012 0700   CL 102 11/17/2012 0820   CL 102 07/06/2012 0700   CO2 25 07/11/2013 0911   CO2 28 07/06/2012 0700   BUN 13.3 07/11/2013 0911   BUN 6 07/06/2012 0700   CREATININE 0.8 07/11/2013 0911   CREATININE 0.52 07/06/2012 0700      Component Value Date/Time   CALCIUM 10.4  07/11/2013 0911   CALCIUM 9.0 07/06/2012 0700   ALKPHOS 92 07/11/2013 0911   ALKPHOS 102 07/04/2012 0600   AST 21 07/11/2013 0911   AST 9 07/04/2012 0600   ALT 11 07/11/2013 0911   ALT 17 07/04/2012 0600   BILITOT 0.33 07/11/2013 0911   BILITOT 0.7 07/04/2012 0600     ASSESSMENT & PLAN:  #1 recurrent lymphoma We will proceed with rituximab today. Our recommendation would be to continue treatment every other month for a total of 2 years. We will only proceed with further imaging study if there is clinical suspicion of disease recurrence. Clinically there's no evidence of disease recurrence #2 history of compression fracture We'll continue calcium, vitamin D, as well as Fosamax   Orders Placed This Encounter  Procedures  . CBC with Differential    Standing Status: Future     Number of Occurrences:      Standing Expiration Date: 09/05/2014  . Comprehensive metabolic panel    Standing Status: Future     Number of Occurrences:      Standing Expiration Date: 09/05/2014   All questions were answered. The patient knows to call the clinic with any problems, questions or concerns. No barriers to learning was  detected. I spent 15 minutes counseling the patient face to face. The total time spent in the appointment was 20 minutes and more than 50% was on counseling and review of test results     [facility], Odie Edmonds, MD 09/05/2013 9:22 AM

## 2013-10-02 ENCOUNTER — Ambulatory Visit: Payer: Medicare HMO | Admitting: Family Medicine

## 2013-11-07 ENCOUNTER — Encounter (HOSPITAL_BASED_OUTPATIENT_CLINIC_OR_DEPARTMENT_OTHER): Payer: Commercial Managed Care - HMO

## 2013-11-07 ENCOUNTER — Ambulatory Visit (HOSPITAL_BASED_OUTPATIENT_CLINIC_OR_DEPARTMENT_OTHER): Payer: Commercial Managed Care - HMO | Admitting: Hematology and Oncology

## 2013-11-07 ENCOUNTER — Ambulatory Visit (HOSPITAL_BASED_OUTPATIENT_CLINIC_OR_DEPARTMENT_OTHER): Payer: Commercial Managed Care - HMO

## 2013-11-07 VITALS — BP 144/50 | HR 78 | Temp 97.5°F | Resp 18 | Ht 60.0 in | Wt 116.2 lb

## 2013-11-07 DIAGNOSIS — Z95828 Presence of other vascular implants and grafts: Secondary | ICD-10-CM

## 2013-11-07 DIAGNOSIS — C8589 Other specified types of non-Hodgkin lymphoma, extranodal and solid organ sites: Secondary | ICD-10-CM

## 2013-11-07 DIAGNOSIS — D72819 Decreased white blood cell count, unspecified: Secondary | ICD-10-CM

## 2013-11-07 DIAGNOSIS — C8330 Diffuse large B-cell lymphoma, unspecified site: Secondary | ICD-10-CM

## 2013-11-07 DIAGNOSIS — C8590 Non-Hodgkin lymphoma, unspecified, unspecified site: Secondary | ICD-10-CM

## 2013-11-07 DIAGNOSIS — Z452 Encounter for adjustment and management of vascular access device: Secondary | ICD-10-CM

## 2013-11-07 DIAGNOSIS — C8299 Follicular lymphoma, unspecified, extranodal and solid organ sites: Secondary | ICD-10-CM

## 2013-11-07 LAB — CBC WITH DIFFERENTIAL/PLATELET
BASO%: 1.9 % (ref 0.0–2.0)
Basophils Absolute: 0.1 10*3/uL (ref 0.0–0.1)
EOS%: 5.8 % (ref 0.0–7.0)
Eosinophils Absolute: 0.2 10*3/uL (ref 0.0–0.5)
HCT: 38.1 % (ref 34.8–46.6)
HGB: 12.7 g/dL (ref 11.6–15.9)
LYMPH#: 0.5 10*3/uL — AB (ref 0.9–3.3)
LYMPH%: 17.9 % (ref 14.0–49.7)
MCH: 33 pg (ref 25.1–34.0)
MCHC: 33.2 g/dL (ref 31.5–36.0)
MCV: 99.3 fL (ref 79.5–101.0)
MONO#: 0.5 10*3/uL (ref 0.1–0.9)
MONO%: 18.8 % — ABNORMAL HIGH (ref 0.0–14.0)
NEUT%: 55.6 % (ref 38.4–76.8)
NEUTROS ABS: 1.5 10*3/uL (ref 1.5–6.5)
Platelets: 168 10*3/uL (ref 145–400)
RBC: 3.84 10*6/uL (ref 3.70–5.45)
RDW: 13.9 % (ref 11.2–14.5)
WBC: 2.7 10*3/uL — AB (ref 3.9–10.3)

## 2013-11-07 LAB — COMPREHENSIVE METABOLIC PANEL (CC13)
ALT: 13 U/L (ref 0–55)
ANION GAP: 12 meq/L — AB (ref 3–11)
AST: 20 U/L (ref 5–34)
Albumin: 3.7 g/dL (ref 3.5–5.0)
Alkaline Phosphatase: 85 U/L (ref 40–150)
BUN: 11.4 mg/dL (ref 7.0–26.0)
CHLORIDE: 103 meq/L (ref 98–109)
CO2: 25 meq/L (ref 22–29)
Calcium: 9.7 mg/dL (ref 8.4–10.4)
Creatinine: 0.8 mg/dL (ref 0.6–1.1)
GLUCOSE: 96 mg/dL (ref 70–140)
Potassium: 4 mEq/L (ref 3.5–5.1)
SODIUM: 140 meq/L (ref 136–145)
Total Bilirubin: 0.49 mg/dL (ref 0.20–1.20)
Total Protein: 6 g/dL — ABNORMAL LOW (ref 6.4–8.3)

## 2013-11-07 MED ORDER — SODIUM CHLORIDE 0.9 % IJ SOLN
10.0000 mL | INTRAMUSCULAR | Status: DC | PRN
  Administered 2013-11-07: 10 mL via INTRAVENOUS
  Filled 2013-11-07: qty 10

## 2013-11-07 MED ORDER — HEPARIN SOD (PORK) LOCK FLUSH 100 UNIT/ML IV SOLN
500.0000 [IU] | Freq: Once | INTRAVENOUS | Status: AC
  Administered 2013-11-07: 500 [IU] via INTRAVENOUS
  Filled 2013-11-07: qty 5

## 2013-11-07 NOTE — Patient Instructions (Signed)

## 2013-11-07 NOTE — Progress Notes (Signed)
[facility] OFFICE PROGRESS NOTE  Patient Care Team: Eulas Post, MD as PCP - General Heath Lark, MD as Consulting Physician (Hematology and Oncology)  DIAGNOSIS: History of recurrent lymphoma, on therapy with rituximab  SUMMARY OF ONCOLOGIC HISTORY: Oncology History   .Lymphoma, follicular subtype   Primary site: Lymphoid Neoplasms (Bilateral)   Staging method: AJCC 6th Edition   Clinical: Stage IV (positiv pleural effusion)   Pathologic: Stage IV signed by Heath Lark, MD on 05/16/2013  9:45 AM   Summary: Stage IV  Diffuse large B cell lymphoma, relapsed from transformation   Primary site: Lymphoid Neoplasms (Right)   Staging method: AJCC 6th Edition   Clinical: Stage II signed by Heath Lark, MD on 05/16/2013 10:18 AM   Pathologic: Stage II signed by Heath Lark, MD on 05/16/2013 10:18 AM   Summary: Stage II       Lymphoma   06/09/2011 Procedure LN biopsy confirmed low grade follicular lymphoma   01/30/2777 Initial Diagnosis Lymphoma   07/06/2011 Bone Marrow Biopsy BM was negative   07/08/2011 Imaging Hypermetabolic lymphadenopathy in the neck, chest, abdomen, and pelvis,as well as pleural effusion   07/21/2011 - 09/17/2011 Chemotherapy Patient received 3 cycles of Bendamustine/Rituximab. Chemotherapy was stopped due to new palpable lump and compression fractures with bone pain   10/12/2011 Imaging PET/CT scan confirmed complete response   03/09/2012 Relapse/Recurrence Patient felt an enlarge LN in her neck   03/28/2012 Imaging Repeat PET/CT showed High-grade lymphoma recurrence with intensely hypermetabolic lymph nodes in the right neck.      04/04/2012 Procedure LN biopsy confirmed transformation to DLBCL   04/14/2012 Bone Marrow Biopsy BM biopsy was negative   04/24/2012 - 09/04/2012 Chemotherapy She completed 5 cycles of mini-RCHOP   09/15/2012 Imaging Repeat PET/CT showed resolved adenopathy in the neck.  No current abnormal extraosseous hypermetabolic activity  suspicious for tumor.    11/17/2012 - 09/05/2013 Chemotherapy Patient is started on maintenance Rituximab every other month    INTERVAL HISTORY: Colleen Cooke 79 y.o. female returns for further followup. She felt well since her last visit. Denies any recent infection. Denies any lymphadenopathy. She denies any recent fever, chills, night sweats or abnormal weight loss  I have reviewed the past medical history, past surgical history, social history and family history with the patient and they are unchanged from previous note.  ALLERGIES:  is allergic to penicillins.  MEDICATIONS:  Current Outpatient Prescriptions  Medication Sig Dispense Refill  . alendronate (FOSAMAX) 70 MG tablet Take 1 tablet (70 mg total) by mouth every Sunday. Take with a full glass of water on an empty stomach.  12 tablet  3  . Calcium Carbonate (CALCIUM 600 PO) Take 630 mg by mouth daily.      . cholecalciferol (VITAMIN D) 1000 UNITS tablet Take 1,000 Units by mouth daily.      Marland Kitchen OVER THE COUNTER MEDICATION Take 1,000 mg by mouth daily. Emergency vitamin c powder      . furosemide (LASIX) 20 MG tablet       . gabapentin (NEURONTIN) 100 MG capsule Take 1 capsule (100 mg total) by mouth daily.  90 capsule  3  . lidocaine-prilocaine (EMLA) cream Apply topically as needed. Put it on portacath one hour before access.  30 g  2  . mirtazapine (REMERON) 15 MG tablet Take 1 tablet (15 mg total) by mouth at bedtime.  90 tablet  3   No current facility-administered medications for this visit.   Facility-Administered Medications  Ordered in Other Visits  Medication Dose Route Frequency Provider Last Rate Last Dose  . sodium chloride 0.9 % injection 10 mL  10 mL Intravenous PRN Heath Lark, MD   10 mL at 11/07/13 0943    REVIEW OF SYSTEMS:   Constitutional: Denies fevers, chills or abnormal weight loss Eyes: Denies blurriness of vision Ears, nose, mouth, throat, and face: Denies mucositis or sore throat Respiratory:  Denies cough, dyspnea or wheezes Cardiovascular: Denies palpitation, chest discomfort or lower extremity swelling Gastrointestinal:  Denies nausea, heartburn or change in bowel habits Skin: Denies abnormal skin rashes Lymphatics: Denies new lymphadenopathy or easy bruising Neurological:Denies numbness, tingling or new weaknesses Behavioral/Psych: Mood is stable, no new changes  All other systems were reviewed with the patient and are negative.  PHYSICAL EXAMINATION: ECOG PERFORMANCE STATUS: 0 - Asymptomatic  Filed Vitals:   11/07/13 0815  BP: 144/50  Pulse: 78  Temp: 97.5 F (36.4 C)  Resp: 18   Filed Weights   11/07/13 0815  Weight: 116 lb 3.2 oz (52.708 kg)    GENERAL:alert, no distress and comfortable. She looks thin but not cachectic SKIN: skin color, texture, turgor are normal, no rashes or significant lesions EYES: normal, Conjunctiva are pink and non-injected, sclera clear OROPHARYNX:no exudate, no erythema and lips, buccal mucosa, and tongue normal  NECK: supple, thyroid normal size, non-tender, without nodularity LYMPH:  no palpable lymphadenopathy in the cervical, axillary or inguinal LUNGS: clear to auscultation and percussion with normal breathing effort HEART: regular rate & rhythm and no murmurs and no lower extremity edema ABDOMEN:abdomen soft, non-tender and normal bowel sounds Musculoskeletal:no cyanosis of digits and no clubbing  NEURO: alert & oriented x 3 with fluent speech, no focal motor/sensory deficits  LABORATORY DATA:  I have reviewed the data as listed    Component Value Date/Time   NA 140 11/07/2013 0802   NA 134* 07/06/2012 0700   K 4.0 11/07/2013 0802   K 4.3 07/06/2012 0700   CL 102 11/17/2012 0820   CL 102 07/06/2012 0700   CO2 25 11/07/2013 0802   CO2 28 07/06/2012 0700   GLUCOSE 96 11/07/2013 0802   GLUCOSE 98 11/17/2012 0820   GLUCOSE 95 07/06/2012 0700   BUN 11.4 11/07/2013 0802   BUN 6 07/06/2012 0700   CREATININE 0.8 11/07/2013 0802    CREATININE 0.52 07/06/2012 0700   CALCIUM 9.7 11/07/2013 0802   CALCIUM 9.0 07/06/2012 0700   PROT 6.0* 11/07/2013 0802   PROT 5.0* 07/04/2012 0600   ALBUMIN 3.7 11/07/2013 0802   ALBUMIN 2.7* 07/04/2012 0600   AST 20 11/07/2013 0802   AST 9 07/04/2012 0600   ALT 13 11/07/2013 0802   ALT 17 07/04/2012 0600   ALKPHOS 85 11/07/2013 0802   ALKPHOS 102 07/04/2012 0600   BILITOT 0.49 11/07/2013 0802   BILITOT 0.7 07/04/2012 0600   GFRNONAA >90 07/06/2012 0700   GFRAA >90 07/06/2012 0700    No results found for this basename: SPEP,  UPEP,   kappa and lambda light chains    Lab Results  Component Value Date   WBC 2.7* 11/07/2013   NEUTROABS 1.5 11/07/2013   HGB 12.7 11/07/2013   HCT 38.1 11/07/2013   MCV 99.3 11/07/2013   PLT 168 11/07/2013      Chemistry      Component Value Date/Time   NA 140 11/07/2013 0802   NA 134* 07/06/2012 0700   K 4.0 11/07/2013 0802   K 4.3 07/06/2012 0700   CL  102 11/17/2012 0820   CL 102 07/06/2012 0700   CO2 25 11/07/2013 0802   CO2 28 07/06/2012 0700   BUN 11.4 11/07/2013 0802   BUN 6 07/06/2012 0700   CREATININE 0.8 11/07/2013 0802   CREATININE 0.52 07/06/2012 0700      Component Value Date/Time   CALCIUM 9.7 11/07/2013 0802   CALCIUM 9.0 07/06/2012 0700   ALKPHOS 85 11/07/2013 0802   ALKPHOS 102 07/04/2012 0600   AST 20 11/07/2013 0802   AST 9 07/04/2012 0600   ALT 13 11/07/2013 0802   ALT 17 07/04/2012 0600   BILITOT 0.49 11/07/2013 0802   BILITOT 0.7 07/04/2012 0600     ASSESSMENT & PLAN:  #1 history of recurrent lymphoma, no evidence of disease Our recommendation would be to continue treatment every other month for a total of 2 years. We will only proceed with further imaging study if there is clinical suspicion of disease recurrence. Clinically there's no evidence of disease recurrence The patient has made an informed decision not to pursue further treatment as [place]. I went ahead and order a port flush today. I plan to see her back in 6 months with history,  physical examination and blood work. If she moves out-of-state before then, the patient will call is for referral to see a hematologist closer to new address.  #2 history of compression fracture We'll continue calcium, vitamin D, as well as Fosamax #3 leukopenia This is likely due to recent treatment. The patient denies recent history of fevers, cough, chills, diarrhea or dysuria. She is asymptomatic from the leukopenia. I will observe for now.   Orders Placed This Encounter  Procedures  . Comprehensive metabolic panel    Standing Status: Future     Number of Occurrences:      Standing Expiration Date: 11/07/2014  . CBC with Differential    Standing Status: Future     Number of Occurrences:      Standing Expiration Date: 11/07/2014  . Lactate dehydrogenase    Standing Status: Future     Number of Occurrences:      Standing Expiration Date: 11/07/2014   All questions were answered. The patient knows to call the clinic with any problems, questions or concerns. No barriers to learning was detected. I spent 15 minutes counseling the patient face to face. The total time spent in the appointment was 20 minutes and more than 50% was on counseling and review of test results     Heath Lark, MD 11/07/2013 10:41 AM

## 2013-11-10 ENCOUNTER — Telehealth: Payer: Self-pay | Admitting: Hematology and Oncology

## 2013-11-10 NOTE — Telephone Encounter (Signed)
s.w. pt and advised on all appt....pt was already aware per my chart

## 2013-11-13 ENCOUNTER — Encounter (HOSPITAL_COMMUNITY): Payer: Self-pay | Admitting: Emergency Medicine

## 2013-11-13 ENCOUNTER — Emergency Department (HOSPITAL_COMMUNITY): Payer: Medicare HMO

## 2013-11-13 ENCOUNTER — Emergency Department (HOSPITAL_COMMUNITY)
Admission: EM | Admit: 2013-11-13 | Discharge: 2013-11-13 | Disposition: A | Discharge status: Home / Self Care | Payer: Medicare HMO | Attending: Emergency Medicine | Admitting: Emergency Medicine

## 2013-11-13 DIAGNOSIS — N9489 Other specified conditions associated with female genital organs and menstrual cycle: Secondary | ICD-10-CM | POA: Insufficient documentation

## 2013-11-13 DIAGNOSIS — N938 Other specified abnormal uterine and vaginal bleeding: Secondary | ICD-10-CM | POA: Insufficient documentation

## 2013-11-13 DIAGNOSIS — F17200 Nicotine dependence, unspecified, uncomplicated: Secondary | ICD-10-CM | POA: Insufficient documentation

## 2013-11-13 DIAGNOSIS — R141 Gas pain: Secondary | ICD-10-CM | POA: Insufficient documentation

## 2013-11-13 DIAGNOSIS — Z79899 Other long term (current) drug therapy: Secondary | ICD-10-CM | POA: Insufficient documentation

## 2013-11-13 DIAGNOSIS — Z87898 Personal history of other specified conditions: Secondary | ICD-10-CM | POA: Insufficient documentation

## 2013-11-13 DIAGNOSIS — Z98811 Dental restoration status: Secondary | ICD-10-CM | POA: Insufficient documentation

## 2013-11-13 DIAGNOSIS — Z7983 Long term (current) use of bisphosphonates: Secondary | ICD-10-CM | POA: Insufficient documentation

## 2013-11-13 DIAGNOSIS — N949 Unspecified condition associated with female genital organs and menstrual cycle: Secondary | ICD-10-CM | POA: Insufficient documentation

## 2013-11-13 DIAGNOSIS — R143 Flatulence: Secondary | ICD-10-CM

## 2013-11-13 DIAGNOSIS — Z8619 Personal history of other infectious and parasitic diseases: Secondary | ICD-10-CM | POA: Insufficient documentation

## 2013-11-13 DIAGNOSIS — Z789 Other specified health status: Secondary | ICD-10-CM | POA: Insufficient documentation

## 2013-11-13 DIAGNOSIS — N898 Other specified noninflammatory disorders of vagina: Secondary | ICD-10-CM

## 2013-11-13 DIAGNOSIS — Z880 Allergy status to penicillin: Secondary | ICD-10-CM | POA: Insufficient documentation

## 2013-11-13 DIAGNOSIS — N939 Abnormal uterine and vaginal bleeding, unspecified: Secondary | ICD-10-CM

## 2013-11-13 DIAGNOSIS — G8929 Other chronic pain: Secondary | ICD-10-CM | POA: Insufficient documentation

## 2013-11-13 DIAGNOSIS — R109 Unspecified abdominal pain: Secondary | ICD-10-CM | POA: Insufficient documentation

## 2013-11-13 DIAGNOSIS — R142 Eructation: Secondary | ICD-10-CM

## 2013-11-13 LAB — PROTIME-INR
INR: 0.98 (ref 0.00–1.49)
Prothrombin Time: 12.8 seconds (ref 11.6–15.2)

## 2013-11-13 LAB — BASIC METABOLIC PANEL
BUN: 11 mg/dL (ref 6–23)
CO2: 27 mEq/L (ref 19–32)
Calcium: 10.1 mg/dL (ref 8.4–10.5)
Chloride: 97 mEq/L (ref 96–112)
Creatinine, Ser: 0.81 mg/dL (ref 0.50–1.10)
GFR calc Af Amer: 80 mL/min — ABNORMAL LOW (ref >90)
GFR, EST NON AFRICAN AMERICAN: 69 mL/min — AB (ref >90)
Glucose, Bld: 107 mg/dL — ABNORMAL HIGH (ref 70–99)
Potassium: 4.4 mEq/L (ref 3.7–5.3)
Sodium: 136 mEq/L — ABNORMAL LOW (ref 137–147)

## 2013-11-13 LAB — URINALYSIS, ROUTINE W REFLEX MICROSCOPIC
BILIRUBIN URINE: NEGATIVE
GLUCOSE, UA: NEGATIVE mg/dL
KETONES UR: NEGATIVE mg/dL
LEUKOCYTES UA: NEGATIVE
Nitrite: NEGATIVE
PH: 5.5 (ref 5.0–8.0)
Protein, ur: NEGATIVE mg/dL
Specific Gravity, Urine: 1.006 (ref 1.005–1.030)
Urobilinogen, UA: 0.2 mg/dL (ref 0.0–1.0)

## 2013-11-13 LAB — APTT: APTT: 26 s (ref 24–37)

## 2013-11-13 LAB — CBC
HEMATOCRIT: 36.4 % (ref 36.0–46.0)
Hemoglobin: 12.5 g/dL (ref 12.0–15.0)
MCH: 33 pg (ref 26.0–34.0)
MCHC: 34.3 g/dL (ref 30.0–36.0)
MCV: 96 fL (ref 78.0–100.0)
Platelets: 150 10*3/uL (ref 150–400)
RBC: 3.79 MIL/uL — ABNORMAL LOW (ref 3.87–5.11)
RDW: 13.1 % (ref 11.5–15.5)
WBC: 3.4 10*3/uL — ABNORMAL LOW (ref 4.0–10.5)

## 2013-11-13 LAB — WET PREP, GENITAL
Clue Cells Wet Prep HPF POC: NONE SEEN
Trich, Wet Prep: NONE SEEN
YEAST WET PREP: NONE SEEN

## 2013-11-13 LAB — URINE MICROSCOPIC-ADD ON

## 2013-11-13 MED ORDER — OXYMETAZOLINE HCL 0.05 % NA SOLN
1.0000 | Freq: Once | NASAL | Status: AC
  Administered 2013-11-13: 1 via NASAL
  Filled 2013-11-13: qty 15

## 2013-11-13 MED ORDER — IOHEXOL 300 MG/ML  SOLN
100.0000 mL | Freq: Once | INTRAMUSCULAR | Status: AC | PRN
  Administered 2013-11-13: 100 mL via INTRAVENOUS

## 2013-11-13 MED ORDER — HEPARIN SOD (PORK) LOCK FLUSH 100 UNIT/ML IV SOLN
500.0000 [IU] | Freq: Once | INTRAVENOUS | Status: AC
  Administered 2013-11-13: 500 [IU]
  Filled 2013-11-13: qty 5

## 2013-11-13 MED ORDER — CLINDAMYCIN PHOSPHATE 2 % VA CREA
1.0000 | TOPICAL_CREAM | Freq: Every day | VAGINAL | Status: DC
  Administered 2013-11-13: 1 via VAGINAL
  Filled 2013-11-13: qty 40

## 2013-11-13 NOTE — ED Notes (Addendum)
Per Jarrett Soho PA request to call lab to find out what tube vaginal tissue needs to be sent in. Per Marcello Moores from [id number] send in urine specimen container with swab via tube station to 12.

## 2013-11-13 NOTE — Discharge Instructions (Signed)
1. Medications: usual home medications 2. Treatment: rest, drink plenty of fluids,  3. Follow Up: Please followup with the [facility] - Please call in the AM for an emergency appointment.

## 2013-11-13 NOTE — ED Notes (Signed)
Per pt vaginal bleeding since today. Pt reports "like a heavy period." Pt reports spotting 5 years ago but it went away. Pt denies change in activity or other symptoms. Pt in NAD at denies pain at present time.

## 2013-11-13 NOTE — ED Provider Notes (Signed)
CSN: [id number]     Arrival date & time 11/13/13  1721 History   First MD Initiated Contact with Patient 11/13/13 1743     Chief Complaint  Patient presents with  . Vaginal Bleeding     (Consider location/radiation/quality/duration/timing/severity/associated sxs/prior Treatment) The history is provided by the patient and medical records. No language interpreter was used.    Colleen Cooke is a 79 y.o. female  with a hx of chronic back pain, non-Hodgkin's lymphoma presents to the Emergency Department complaining of gradual, persistent, progressively worsening vaginal bleeding onset 7am today.  Pt reports she saw blood in the toilet, but believes it is vaginal in nature as this feels like a menstration.  Pt reports that 5 years ago she had some spotting that lasted approx 1 day and resolved spontaneously, but she has not had any further bleeding and did not have a medical examination afterwards.  Associated symptoms include intermittent abd pain happening every several months and lasting approc 3-4 hours before resolving completely and spontaneously. She reports she has not had these pains in several months. She feels her abd is distended today, but it is not painful.  Last BM was this AM and was normal without melena or hematochezia.  She denies fever, chills, headache, neck pain, chest pain, SOB, abd pain, N/V/D, weakness, dizziness, syncope, dysuria.  No known aggravating or alleviating symptoms.     Past Medical History  Diagnosis Date  . Chicken pox   . Follicular lymphoma 06/6008  . Non-Hodgkin's lymphoma, Burkitt's     Jan. 2013  . Back pain     pt has compression fractures in back  . Wears glasses   . Wears dentures     upper and lower  . Diffuse large B cell lymphoma 04/13/2012   Past Surgical History  Procedure Laterality Date  . Appendectomy      age 65  . Tonsillectomy      age 78  . Mass biopsy  04/04/2012    Procedure: NECK MASS BIOPSY;  Surgeon: Rozetta Nunnery,  MD;  Location: [place];  Service: ENT;  Laterality: Right;   Family History  Problem Relation Age of Onset  . Coronary artery disease Mother 1   History  Substance Use Topics  . Smoking status: Current Every Day Smoker -- 0.50 packs/day for 20 years    Types: Cigarettes  . Smokeless tobacco: Never Used  . Alcohol Use: 4.2 oz/week    7 Cans of beer per week     Comment: one beer a night   OB History   Grav Para Term Preterm Abortions TAB SAB Ect Mult Living                 Review of Systems  Constitutional: Negative for fever, diaphoresis, appetite change, fatigue and unexpected weight change.  HENT: Negative for mouth sores.   Eyes: Negative for visual disturbance.  Respiratory: Negative for cough, chest tightness, shortness of breath and wheezing.   Cardiovascular: Negative for chest pain.  Gastrointestinal: Positive for abdominal distention. Negative for nausea, vomiting, abdominal pain, diarrhea and constipation.  Endocrine: Negative for polydipsia, polyphagia and polyuria.  Genitourinary: Positive for vaginal bleeding. Negative for dysuria, urgency, frequency and hematuria.  Musculoskeletal: Negative for back pain and neck stiffness.  Skin: Negative for rash.  Allergic/Immunologic: Negative for immunocompromised state.  Neurological: Negative for syncope, light-headedness and headaches.  Hematological: Does not bruise/bleed easily.  Psychiatric/Behavioral: Negative for sleep disturbance. The patient is not nervous/anxious.  Allergies  Penicillins  Home Medications   Prior to Admission medications   Medication Sig Start Date End Date Taking? Authorizing Provider  alendronate (FOSAMAX) 70 MG tablet Take 70 mg by mouth once a week. Take with a full glass of water on an empty stomach.   Yes Historical Provider, MD  ascorbic acid (VITAMIN C) 500 MG tablet Take 500 mg by mouth daily.   Yes Historical Provider, MD  Calcium Carbonate (CALCIUM 600  PO) Take 600 mg by mouth daily.    Yes Historical Provider, MD  cholecalciferol (VITAMIN D) 1000 UNITS tablet Take 1,000 Units by mouth daily.   Yes Historical Provider, MD  lidocaine-prilocaine (EMLA) cream Apply topically as needed. Put it on portacath one hour before access. 04/13/12  Yes Nobie Putnam, MD  MAGNESIUM PO Take 1 tablet by mouth daily.   Yes Historical Provider, MD  POTASSIUM PO Take 1 tablet by mouth daily.   Yes Historical Provider, MD  VITAMIN E PO Take 1 capsule by mouth daily.   Yes Historical Provider, MD   BP 136/7  Pulse 86  Temp(Src) 98 F (36.7 C) (Oral)  Resp 18  SpO2 94% Physical Exam  Nursing note and vitals reviewed. Constitutional: She is oriented to person, place, and time. She appears well-developed and well-nourished. No distress.  Awake, alert, nontoxic appearance  HENT:  Head: Normocephalic and atraumatic.  Mouth/Throat: Oropharynx is clear and moist. No oropharyngeal exudate.  Eyes: Conjunctivae are normal. No scleral icterus.  Neck: Normal range of motion. Neck supple.  Cardiovascular: Normal rate, regular rhythm, normal heart sounds and intact distal pulses.   No murmur heard. Pulmonary/Chest: Effort normal and breath sounds normal. No respiratory distress. She has no wheezes.  Abdominal: Soft. Bowel sounds are normal. She exhibits no distension and no mass. There is no hepatosplenomegaly. There is no tenderness. There is no rebound, no guarding and no CVA tenderness. Hernia confirmed negative in the right inguinal area and confirmed negative in the left inguinal area.  Soft and nontender Not palpably or visibly distended  Genitourinary: Uterus normal. Pelvic exam was performed with patient supine. There is no rash, tenderness or lesion on the right labia. There is no rash, tenderness or lesion on the left labia. Uterus is not deviated, not enlarged, not fixed and not tender. Cervix exhibits no motion tenderness, no discharge and no friability. Right  adnexum displays no mass, no tenderness and no fullness. Left adnexum displays no mass, no tenderness and no fullness. There is bleeding around the vagina. No erythema or tenderness around the vagina. No foreign body around the vagina. No signs of injury around the vagina. No vaginal discharge found.  Bleeding mass noted to the left vaginal wall No blood from the cervical os  Musculoskeletal: Normal range of motion. She exhibits no edema.  Lymphadenopathy:    She has no cervical adenopathy.       Right: No inguinal adenopathy present.       Left: No inguinal adenopathy present.  Neurological: She is alert and oriented to person, place, and time. She exhibits normal muscle tone. Coordination normal.  Speech is clear and goal oriented Moves extremities without ataxia  Skin: Skin is warm and dry. No rash noted. She is not diaphoretic.  Psychiatric: She has a normal mood and affect. Her behavior is normal.    ED Course  Procedures (including critical care time) Labs Review Labs Reviewed  WET PREP, GENITAL - Abnormal; Notable for the following:  WBC, Wet Prep HPF POC FEW (*)    All other components within normal limits  URINALYSIS, ROUTINE W REFLEX MICROSCOPIC - Abnormal; Notable for the following:    Hgb urine dipstick TRACE (*)    All other components within normal limits  CBC - Abnormal; Notable for the following:    WBC 3.4 (*)    RBC 3.79 (*)    All other components within normal limits  BASIC METABOLIC PANEL - Abnormal; Notable for the following:    Sodium 136 (*)    Glucose, Bld 107 (*)    GFR calc non Af Amer 69 (*)    GFR calc Af Amer 80 (*)    All other components within normal limits  TISSUE CULTURE  URINE MICROSCOPIC-ADD ON  PROTIME-INR  APTT    Imaging Review Ct Abdomen Pelvis W Contrast  11/13/2013   CLINICAL DATA:  Abnormal vaginal bleeding and abdominal distention. History of lymphoma.  EXAM: CT ABDOMEN AND PELVIS WITH CONTRAST  TECHNIQUE: Multidetector CT  imaging of the abdomen and pelvis was performed using the standard protocol following bolus administration of intravenous contrast.  CONTRAST:  116mL OMNIPAQUE IOHEXOL 300 MG/ML  SOLN  COMPARISON:  CT scan dated 07/08/2011 and PET-CT scan dated 09/15/2012  FINDINGS: The uterus is atrophic. There is a 3.7 x 2.5 cm soft tissue mass in the right adnexum with adjacent calcifications, unchanged since 07/08/2011 and non FDG avid on PET scan. This is not felt to be significant. Atrophic left ovary is normal.  Liver, biliary tree, spleen, pancreas, adrenal glands, and kidneys are normal. The previously demonstrated periaortic, pelvic, and inguinal adenopathy has completely resolved.  The bladder appears normal. There are some calcifications around the bladder base which have minimally increased since the prior study.  There is some fluid in the vagina and there is enhancement of the wall of the vagina, nonspecific.  The patient has compression fractures at T12, L2, L3, and L4, new since 07/08/2011.  IMPRESSION: 1. Increased enhancement of the vagina with a small amount of fluid in the vagina. This is nonspecific. 2. Stable right adnexal mass, not felt to be significant. Atrophic uterus and left ovary. 3. Resolution of adenopathy. 4. Multiple compression fractures in the spine.   Electronically Signed   By: Rozetta Nunnery M.D.   On: 11/13/2013 20:47     EKG Interpretation None      MDM   Final diagnoses:  Vaginal mass  Vaginal bleeding   Jakiyah Stepney presents with c/o vaginal bleeding.  .    Patient without anemia on CBC; minimal blood on UA.  On vaginal exam there is no blood from the cervical os.  Bleeding mass noted to the left vaginal wall just inside the introitus  CT with Increased enhancement of the vagina with a small amount of fluid in the vagina and Stable right adnexal mass.    I personally reviewed the imaging tests through PACS system  I reviewed available ER/hospitalization records through  the EMR  10:05PM Discussed with Dr. Kennon Rounds of OB/GYN.  She recommends vaginal packing with clindamycin cream. She reports the patient will be seen in the clinic tomorrow morning.  11:40PM Vaginal packing completed without complication.  Pt remains alert, oriented, nontoxic and nonseptic appearing.    It has been determined that no acute conditions requiring further emergency intervention are present at this time. The patient/guardian have been advised of the diagnosis and plan. We have discussed signs and symptoms that warrant return to the  ED, such as changes or worsening in symptoms.   Vital signs are stable at discharge.   BP 136/7  Pulse 86  Temp(Src) 98 F (36.7 C) (Oral)  Resp 18  SpO2 94%  Patient/guardian has voiced understanding and agreed to follow-up with the PCP or specialist.      Abigail Butts, PA-C 11/14/13 0129

## 2013-11-13 NOTE — ED Notes (Signed)
PA at bedside.

## 2013-11-13 NOTE — ED Notes (Signed)
PA at bedside with Clarise Cruz, ED tech assisting

## 2013-11-13 NOTE — ED Notes (Signed)
Assumed care of patient Port accessed by Caryl Comes., RN--labs drawn and sent Patient taken to CT dept via stretcher and in NAD upon leaving ED for testing

## 2013-11-13 NOTE — ED Notes (Signed)
Patient back from CT dept  Patient placed on bedpan  Urine noted to be blood tinged with clots--ongoing since arrival in ED Patient appears in NAD Side rails up, call bell in reach

## 2013-11-14 ENCOUNTER — Encounter: Payer: Self-pay | Admitting: Family Medicine

## 2013-11-14 ENCOUNTER — Other Ambulatory Visit (HOSPITAL_COMMUNITY)
Admission: RE | Admit: 2013-11-14 | Discharge: 2013-11-14 | Disposition: A | Discharge status: Home / Self Care | Payer: Medicare HMO | Source: Ambulatory Visit | Attending: Family Medicine | Admitting: Family Medicine

## 2013-11-14 ENCOUNTER — Ambulatory Visit (INDEPENDENT_AMBULATORY_CARE_PROVIDER_SITE_OTHER): Payer: Commercial Managed Care - HMO | Admitting: Family Medicine

## 2013-11-14 VITALS — BP 126/74 | HR 100 | Temp 97.4°F | Ht 60.0 in | Wt 113.6 lb

## 2013-11-14 DIAGNOSIS — N9489 Other specified conditions associated with female genital organs and menstrual cycle: Secondary | ICD-10-CM

## 2013-11-14 DIAGNOSIS — N898 Other specified noninflammatory disorders of vagina: Secondary | ICD-10-CM

## 2013-11-14 DIAGNOSIS — C52 Malignant neoplasm of vagina: Secondary | ICD-10-CM | POA: Insufficient documentation

## 2013-11-14 LAB — CBC
HEMATOCRIT: 36.7 % (ref 36.0–46.0)
Hemoglobin: 12.9 g/dL (ref 12.0–15.0)
MCH: 32.7 pg (ref 26.0–34.0)
MCHC: 35.1 g/dL (ref 30.0–36.0)
MCV: 92.9 fL (ref 78.0–100.0)
Platelets: 192 10*3/uL (ref 150–400)
RBC: 3.95 MIL/uL (ref 3.87–5.11)
RDW: 14.1 % (ref 11.5–15.5)
WBC: 4 10*3/uL (ref 4.0–10.5)

## 2013-11-14 NOTE — Progress Notes (Signed)
Patient reports packing from ED last night fell out before she got home-- bled heavily throughout the night. Went through two pads, and soaked through her clothes. Wearing depends now.

## 2013-11-14 NOTE — Progress Notes (Signed)
Subjective:     Patient ID: Colleen Cooke, female   DOB: 1935/06/23, 79 y.o.   MRN: [id number]  HPI 79 y.o. F here for follow up from ED. Pt was seen yesterday for vaginal bleeding. At ED pt was found to have vaginal mass. Packing with clinda was placed and referral to clinic was placed. Pt states that she had a gush of blood that was constant worse with sitting then standing. Filled a pad in 5 minutes. Overnight there was continued blood loss. This morning pt reports decreased.   Hx of non-hodgkins lymphoma, completed treatment 5 months ago   Reports pt is on fosamax and no other meds at this time  Review of Systems Feels weaker today, Does not feel like she is going to pass out, no palpitations, no light headed. No abdominal pain, weight loss, no night sweats.     Objective:   Physical Exam Filed Vitals:   11/14/13 0828  BP: 126/74  Pulse: 100  Temp: 97.4 F (36.3 C)   Mild tachycardia, normotensive. NAD XHB:ZJIRCVELF mass on Right vagina, red blood in vault, normal appearing hypoestrogenic cervix. No blood from os, no cervical bx taken.    Assessment:     79 y.o. F with fungating bleeding mass on vagina. Concern for malignancy. Biopsy taken and will follow up here Friday with Dr. Harolyn Rutherford for results. High concern for malignancy.   Sent for pathology.  Procedure: Punch Biopsy of vaginal fungating mass Consent: The risks and benefits of the procedure were discussed with the patient. Risks include acute and chronic pain, infection (minimized by sterile technique), bleeding (internal and external), and need for subsequent procedure to correct a poor cosmetic result or to further treat symptoms. Rarely, damage to internal structures may occur requiring subsequent procedures or, exceedingly rare, long term complications. Benefits may include symptom relief, cosmetic effect and potentially diagnosis. The alternatives were explained to the patient including: not doing procedure or  postponing procedure. The disadvantages to not doing the procedure were discussed with the patent, including the persistence of the lesion or failure to diagnose the pathology. The written consent form has been signed and placed in the patient's medical record The patient voiced understanding of the procedure and agreed to proceed. Indication: Presence of skin lesion of unclear behavior Physicians: Dr. Leamon Arnt Description in detail: A timeout was completed before the start of the procedure - the site was  verified and documented in the chart. Vaginal packing was removed. The area was  prepped  in a sterile fashion, and infiltrated with 1.5 mL of 1% lidocaine with epinephrine for local anesthesia. A 48mm punch biopsy was used to  remove the lesion, and a cervical punch biopsy was also used to collect tissue and the specimen was placed in placed in specimen jar and sent for analysis. Closure was not required. Monsel's solution was applied. Hemostasis was achieved.  EBL: Moderate; 62mL Complications: None Instructions to patient: Return to clinic vs. ER, depending on severity, with fevers,   swelling, increasing pain, erythema, or purulent discharge from wound. Expect mild   soreness for one day. Call clinic with other questions. Follow up plan: Return to clinic for follow up based on biopsy results on friday

## 2013-11-16 ENCOUNTER — Telehealth: Payer: Self-pay | Admitting: Obstetrics & Gynecology

## 2013-11-16 ENCOUNTER — Ambulatory Visit (INDEPENDENT_AMBULATORY_CARE_PROVIDER_SITE_OTHER): Payer: Commercial Managed Care - HMO | Admitting: Obstetrics & Gynecology

## 2013-11-16 ENCOUNTER — Encounter: Payer: Self-pay | Admitting: Obstetrics & Gynecology

## 2013-11-16 VITALS — BP 133/77 | HR 101 | Temp 96.7°F | Ht 65.0 in | Wt 113.1 lb

## 2013-11-16 DIAGNOSIS — N898 Other specified noninflammatory disorders of vagina: Secondary | ICD-10-CM

## 2013-11-16 DIAGNOSIS — C8330 Diffuse large B-cell lymphoma, unspecified site: Secondary | ICD-10-CM

## 2013-11-16 DIAGNOSIS — C8589 Other specified types of non-Hodgkin lymphoma, extranodal and solid organ sites: Secondary | ICD-10-CM

## 2013-11-16 DIAGNOSIS — N9489 Other specified conditions associated with female genital organs and menstrual cycle: Secondary | ICD-10-CM

## 2013-11-16 NOTE — Telephone Encounter (Signed)
I received a call from pathology regarding the surg path report on the vaginal biopsy of this pt.  The final is being read as Malignant Melanoma.  The report  was called to Dr. Harolyn Rutherford who has notified the patient.  Pts genoncologist was called but, was not available.  Dr. Harolyn Rutherford will f/u on this when their ofc open on Tues am.  Pt is aware.

## 2013-11-16 NOTE — Progress Notes (Signed)
   CLINIC ENCOUNTER NOTE  78 y.o. PMP F with history of recurrent Non-hodgkins lymphoma on maintenance rituximab here today for follow up after biopsy of a concerning vaginal mass, biopsy done 11/14/13.     SUMMARY OF ONCOLOGIC HISTORY:  DIAGNOSIS: History of recurrent lymphoma, on therapy with rituximab ONCOLOGIST: Heath Lark, MD Oncology History    Lymphoma, follicular subtype  Primary site: Lymphoid Neoplasms (Bilateral)  Staging method: AJCC 6th Edition  Clinical: Stage IV (positiv pleural effusion)  Pathologic: Stage IV signed by Heath Lark, MD on 05/16/2013 9:45 AM  Summary: Stage IV  Diffuse large B cell lymphoma, relapsed from transformation  Primary site: Lymphoid Neoplasms (Right)  Staging method: AJCC 6th Edition  Clinical: Stage II signed by Heath Lark, MD on 05/16/2013 10:18 AM  Pathologic: Stage II signed by Heath Lark, MD on 05/16/2013 10:18 AM  Summary: Stage II     Lymphoma    06/09/2011  Procedure  LN biopsy confirmed low grade follicular lymphoma    09/29/100  Initial Diagnosis  Lymphoma    07/06/2011  Bone Marrow Biopsy  BM was negative    07/08/2011  Imaging  Hypermetabolic lymphadenopathy in the neck, chest, abdomen, and pelvis,as well as pleural effusion    07/21/2011 - 09/17/2011  Chemotherapy  Patient received 3 cycles of Bendamustine/Rituximab. Chemotherapy was stopped due to new palpable lump and compression fractures with bone pain    10/12/2011  Imaging  PET/CT scan confirmed complete response    03/09/2012  Relapse/Recurrence  Patient felt an enlarge LN in her neck    03/28/2012  Imaging  Repeat PET/CT showed High-grade lymphoma recurrence with intensely hypermetabolic lymph nodes in the right neck.    04/04/2012  Procedure  LN biopsy confirmed transformation to DLBCL    04/14/2012  Bone Marrow Biopsy  BM biopsy was negative    04/24/2012 - 09/04/2012  Chemotherapy  She completed 5 cycles of mini-RCHOP    09/15/2012  Imaging  Repeat PET/CT showed resolved adenopathy  in the neck. No current abnormal extraosseous hypermetabolic activity suspicious for tumor.    11/17/2012 - 09/05/2013  Chemotherapy  Patient is started on maintenance Rituximab every other month     As per communication with the pathologist, the biopsy came back positive for a high grade malignancy; EPIC report pending at time of this encounter.  Unknown if this is a metastatic lesion from lymphoma or a separate primary malignancy; pathology report and stains are still pending.  This was discussed with the patient and her husband, appropriate support given to them. Appointment made at [place] office on 11/23/13 with Dr. Fermin Schwab, this was communicated to them. Will follow up pending pathology report for further details.   Verita Schneiders, MD, [place] Attending [place], [place]

## 2013-11-16 NOTE — Patient Instructions (Signed)
Return to clinic for any scheduled appointments or for any gynecologic concerns as needed.   

## 2013-11-17 LAB — TISSUE CULTURE
CULTURE: NO GROWTH
SPECIAL REQUESTS: NORMAL

## 2013-11-20 ENCOUNTER — Telehealth: Payer: Self-pay | Admitting: Hematology and Oncology

## 2013-11-20 ENCOUNTER — Other Ambulatory Visit: Payer: Self-pay | Admitting: Hematology and Oncology

## 2013-11-20 ENCOUNTER — Ambulatory Visit (HOSPITAL_BASED_OUTPATIENT_CLINIC_OR_DEPARTMENT_OTHER): Payer: Commercial Managed Care - HMO

## 2013-11-20 ENCOUNTER — Ambulatory Visit (HOSPITAL_BASED_OUTPATIENT_CLINIC_OR_DEPARTMENT_OTHER): Payer: Commercial Managed Care - HMO | Admitting: Hematology and Oncology

## 2013-11-20 ENCOUNTER — Telehealth: Payer: Self-pay | Admitting: *Deleted

## 2013-11-20 ENCOUNTER — Encounter: Payer: Self-pay | Admitting: Hematology and Oncology

## 2013-11-20 VITALS — BP 135/46 | HR 90 | Temp 98.1°F | Resp 18 | Ht 65.0 in | Wt 115.9 lb

## 2013-11-20 DIAGNOSIS — C439 Malignant melanoma of skin, unspecified: Secondary | ICD-10-CM

## 2013-11-20 DIAGNOSIS — C52 Malignant neoplasm of vagina: Secondary | ICD-10-CM

## 2013-11-20 DIAGNOSIS — N898 Other specified noninflammatory disorders of vagina: Secondary | ICD-10-CM

## 2013-11-20 DIAGNOSIS — C8299 Follicular lymphoma, unspecified, extranodal and solid organ sites: Secondary | ICD-10-CM

## 2013-11-20 DIAGNOSIS — C8590 Non-Hodgkin lymphoma, unspecified, unspecified site: Secondary | ICD-10-CM

## 2013-11-20 DIAGNOSIS — D649 Anemia, unspecified: Secondary | ICD-10-CM

## 2013-11-20 LAB — CBC WITH DIFFERENTIAL/PLATELET
BASO%: 1.2 % (ref 0.0–2.0)
BASOS ABS: 0 10*3/uL (ref 0.0–0.1)
EOS ABS: 0.1 10*3/uL (ref 0.0–0.5)
EOS%: 3.8 % (ref 0.0–7.0)
HCT: 34.1 % — ABNORMAL LOW (ref 34.8–46.6)
HEMOGLOBIN: 11.3 g/dL — AB (ref 11.6–15.9)
LYMPH%: 17.7 % (ref 14.0–49.7)
MCH: 33.1 pg (ref 25.1–34.0)
MCHC: 33.2 g/dL (ref 31.5–36.0)
MCV: 99.6 fL (ref 79.5–101.0)
MONO#: 0.6 10*3/uL (ref 0.1–0.9)
MONO%: 15.1 % — AB (ref 0.0–14.0)
NEUT%: 62.2 % (ref 38.4–76.8)
NEUTROS ABS: 2.4 10*3/uL (ref 1.5–6.5)
PLATELETS: 188 10*3/uL (ref 145–400)
RBC: 3.43 10*6/uL — ABNORMAL LOW (ref 3.70–5.45)
RDW: 13.6 % (ref 11.2–14.5)
WBC: 3.9 10*3/uL (ref 3.9–10.3)
lymph#: 0.7 10*3/uL — ABNORMAL LOW (ref 0.9–3.3)

## 2013-11-20 LAB — COMPREHENSIVE METABOLIC PANEL (CC13)
ALK PHOS: 84 U/L (ref 40–150)
ALT: 11 U/L (ref 0–55)
AST: 18 U/L (ref 5–34)
Albumin: 3.7 g/dL (ref 3.5–5.0)
Anion Gap: 9 mEq/L (ref 3–11)
BUN: 8.8 mg/dL (ref 7.0–26.0)
CO2: 27 mEq/L (ref 22–29)
CREATININE: 0.8 mg/dL (ref 0.6–1.1)
Calcium: 9.8 mg/dL (ref 8.4–10.4)
Chloride: 102 mEq/L (ref 98–109)
Glucose: 127 mg/dl (ref 70–140)
Potassium: 4.4 mEq/L (ref 3.5–5.1)
Sodium: 138 mEq/L (ref 136–145)
Total Bilirubin: 0.25 mg/dL (ref 0.20–1.20)
Total Protein: 6 g/dL — ABNORMAL LOW (ref 6.4–8.3)

## 2013-11-20 LAB — LACTATE DEHYDROGENASE (CC13): LDH: 367 U/L — ABNORMAL HIGH (ref 125–245)

## 2013-11-20 NOTE — Telephone Encounter (Signed)
Can she come today at 2 pm?

## 2013-11-20 NOTE — Telephone Encounter (Signed)
Yes. Pt will come in at 2 pm.  POF sent and scheduler notified.

## 2013-11-20 NOTE — Progress Notes (Signed)
[facility] OFFICE PROGRESS NOTE  Patient Care Team: Eulas Post, MD as PCP - General Heath Lark, MD as Consulting Physician (Hematology and Oncology)  DIAGNOSIS: Newly diagnosed melanoma of the vagina  SUMMARY OF ONCOLOGIC HISTORY: Oncology History   .Lymphoma, follicular subtype   Primary site: Lymphoid Neoplasms (Bilateral)   Staging method: AJCC 6th Edition   Clinical: Stage IV (positiv pleural effusion)   Pathologic: Stage IV signed by Heath Lark, MD on 05/16/2013  9:45 AM   Summary: Stage IV  Diffuse large B cell lymphoma, relapsed from transformation   Primary site: Lymphoid Neoplasms (Right)   Staging method: AJCC 6th Edition   Clinical: Stage II signed by Heath Lark, MD on 05/16/2013 10:18 AM   Pathologic: Stage II signed by Heath Lark, MD on 05/16/2013 10:18 AM   Summary: Stage II       Lymphoma   06/09/2011 Procedure LN biopsy confirmed low grade follicular lymphoma   08/30/7423 Initial Diagnosis Lymphoma   07/06/2011 Bone Marrow Biopsy BM was negative   07/08/2011 Imaging Hypermetabolic lymphadenopathy in the neck, chest, abdomen, and pelvis,as well as pleural effusion   07/21/2011 - 09/17/2011 Chemotherapy Patient received 3 cycles of Bendamustine/Rituximab. Chemotherapy was stopped due to new palpable lump and compression fractures with bone pain   10/12/2011 Imaging PET/CT scan confirmed complete response   03/09/2012 Relapse/Recurrence Patient felt an enlarge LN in her neck   03/28/2012 Imaging Repeat PET/CT showed High-grade lymphoma recurrence with intensely hypermetabolic lymph nodes in the right neck.      04/04/2012 Procedure LN biopsy confirmed transformation to DLBCL   04/14/2012 Bone Marrow Biopsy BM biopsy was negative   04/24/2012 - 09/04/2012 Chemotherapy She completed 5 cycles of mini-RCHOP   09/15/2012 Imaging Repeat PET/CT showed resolved adenopathy in the neck.  No current abnormal extraosseous hypermetabolic activity suspicious for tumor.     11/17/2012 - 09/05/2013 Chemotherapy Patient is started on maintenance Rituximab every other month    INTERVAL HISTORY: Since she was seen here, she started to have passage of bright red blood per vagina. CT scan confirmed a persistent mass in the right adnexa area, unchanged compared to previous CT scan. She underwent vaginal biopsy that come from melanoma. The patient complained of persistent vaginal bleeding/spotting. She denies any dizziness or shortness of breath.  I have reviewed the past medical history, past surgical history, social history and family history with the patient and they are unchanged from previous note.  ALLERGIES:  is allergic to penicillins.  MEDICATIONS:  Current Outpatient Prescriptions  Medication Sig Dispense Refill  . alendronate (FOSAMAX) 70 MG tablet Take 70 mg by mouth once a week. Take with a full glass of water on an empty stomach.      Marland Kitchen ascorbic acid (VITAMIN C) 500 MG tablet Take 500 mg by mouth daily.      . Calcium Carbonate (CALCIUM 600 PO) Take 600 mg by mouth daily.       . cholecalciferol (VITAMIN D) 1000 UNITS tablet Take 1,000 Units by mouth daily.      Marland Kitchen lidocaine-prilocaine (EMLA) cream Apply topically as needed. Put it on portacath one hour before access.  30 g  2  . MAGNESIUM PO Take 1 tablet by mouth daily.      Marland Kitchen POTASSIUM PO Take 1 tablet by mouth daily.      Marland Kitchen VITAMIN E PO Take 1 capsule by mouth daily.       No current facility-administered medications for this visit.  Facility-Administered Medications Ordered in Other Visits  Medication Dose Route Frequency Provider Last Rate Last Dose  . sodium chloride 0.9 % injection 10 mL  10 mL Intravenous PRN Heath Lark, MD   10 mL at 11/07/13 0943    REVIEW OF SYSTEMS:   Constitutional: Denies fevers, chills or abnormal weight loss Eyes: Denies blurriness of vision Ears, nose, mouth, throat, and face: Denies mucositis or sore throat Respiratory: Denies cough, dyspnea or  wheezes Cardiovascular: Denies palpitation, chest discomfort or lower extremity swelling Gastrointestinal:  Denies nausea, heartburn or change in bowel habits Skin: Denies abnormal skin rashes Lymphatics: Denies new lymphadenopathy or easy bruising Neurological:Denies numbness, tingling or new weaknesses Behavioral/Psych: Mood is stable, no new changes  All other systems were reviewed with the patient and are negative.  PHYSICAL EXAMINATION: ECOG PERFORMANCE STATUS: 1 - Symptomatic but completely ambulatory  Filed Vitals:   11/20/13 1412  BP: 135/46  Pulse: 90  Temp: 98.1 F (36.7 C)  Resp: 18   Filed Weights   11/20/13 1412  Weight: 115 lb 14.4 oz (52.572 kg)    GENERAL:alert, no distress and comfortable SKIN: skin color, texture, turgor are normal, no rashes or significant lesions EYES: normal, Conjunctiva are pink and non-injected, sclera clear OROPHARYNX:no exudate, no erythema and lips, buccal mucosa, and tongue normal  NECK: supple, thyroid normal size, non-tender, without nodularity LYMPH:  no palpable lymphadenopathy in the cervical, axillary or inguinal LUNGS: clear to auscultation and percussion with normal breathing effort HEART: regular rate & rhythm and no murmurs and no lower extremity edema ABDOMEN:abdomen soft, non-tender and normal bowel sounds Musculoskeletal:no cyanosis of digits and no clubbing  NEURO: alert & oriented x 3 with fluent speech, no focal motor/sensory deficits Vaginal examination is difficult due to lack of proper instrumentation. On palpation of her external vaginal wall, there is a mass in the 6:00 position. There is significant bleeding with just gentle palpation. LABORATORY DATA:  I have reviewed the data as listed    Component Value Date/Time   NA 138 11/20/2013 1351   NA 136* 11/13/2013 1840   K 4.4 11/20/2013 1351   K 4.4 11/13/2013 1840   CL 97 11/13/2013 1840   CL 102 11/17/2012 0820   CO2 27 11/20/2013 1351   CO2 27 11/13/2013 1840    GLUCOSE 127 11/20/2013 1351   GLUCOSE 107* 11/13/2013 1840   GLUCOSE 98 11/17/2012 0820   BUN 8.8 11/20/2013 1351   BUN 11 11/13/2013 1840   CREATININE 0.8 11/20/2013 1351   CREATININE 0.81 11/13/2013 1840   CALCIUM 9.8 11/20/2013 1351   CALCIUM 10.1 11/13/2013 1840   PROT 6.0* 11/20/2013 1351   PROT 5.0* 07/04/2012 0600   ALBUMIN 3.7 11/20/2013 1351   ALBUMIN 2.7* 07/04/2012 0600   AST 18 11/20/2013 1351   AST 9 07/04/2012 0600   ALT 11 11/20/2013 1351   ALT 17 07/04/2012 0600   ALKPHOS 84 11/20/2013 1351   ALKPHOS 102 07/04/2012 0600   BILITOT 0.25 11/20/2013 1351   BILITOT 0.7 07/04/2012 0600   GFRNONAA 69* 11/13/2013 1840   GFRAA 80* 11/13/2013 1840    No results found for this basename: SPEP, UPEP,  kappa and lambda light chains    Lab Results  Component Value Date   WBC 3.9 11/20/2013   NEUTROABS 2.4 11/20/2013   HGB 11.3* 11/20/2013   HCT 34.1* 11/20/2013   MCV 99.6 11/20/2013   PLT 188 11/20/2013      Chemistry      Component  Value Date/Time   NA 138 11/20/2013 1351   NA 136* 11/13/2013 1840   K 4.4 11/20/2013 1351   K 4.4 11/13/2013 1840   CL 97 11/13/2013 1840   CL 102 11/17/2012 0820   CO2 27 11/20/2013 1351   CO2 27 11/13/2013 1840   BUN 8.8 11/20/2013 1351   BUN 11 11/13/2013 1840   CREATININE 0.8 11/20/2013 1351   CREATININE 0.81 11/13/2013 1840      Component Value Date/Time   CALCIUM 9.8 11/20/2013 1351   CALCIUM 10.1 11/13/2013 1840   ALKPHOS 84 11/20/2013 1351   ALKPHOS 102 07/04/2012 0600   AST 18 11/20/2013 1351   AST 9 07/04/2012 0600   ALT 11 11/20/2013 1351   ALT 17 07/04/2012 0600   BILITOT 0.25 11/20/2013 1351   BILITOT 0.7 07/04/2012 0600    I have reviewed the most recent CT scan of the abdomen and pelvis. ASSESSMENT & PLAN:  Vaginal mass On 11/14/2013, biopsy confirmed melanoma. CT scan show an adnexal mass but I am not sure if this is related. The palpable mass in the vagina wall is close to the introitus. I recommend PET/CT scan to properly stage her. She has an  appointment to see gynecologist oncologist in 3 days time for further assessment. I am concerned about the difficult location where the simple excision is possible. Depending on the extent of her disease, if resection will bring significant morbidity, we might have to treat her with systemic treatment. I will ask the pathologist to order additional workup including BRAF mutation study.  #2 anemia This is due to minor blood loss. Recommend observation. She does not require transfusion.  Orders Placed This Encounter  Procedures  . NM PET Image Initial (PI) Whole Body    Standing Status: Future     Number of Occurrences:      Standing Expiration Date: 01/20/2015    Order Specific Question:  Reason for Exam (SYMPTOM  OR DIAGNOSIS REQUIRED)    Answer:  melanoma of the vagina, hx lymphoma    Order Specific Question:  Preferred imaging location?    Answer:  [facility]   All questions were answered. The patient knows to call the clinic with any problems, questions or concerns. No barriers to learning was detected.   Heath Lark, MD 11/20/2013 8:24 PM

## 2013-11-20 NOTE — Telephone Encounter (Signed)
Instructed pt to come today at 1:30 pm for lab prior to office visit. She verbalized understanding.

## 2013-11-20 NOTE — Telephone Encounter (Signed)
gve the pt her may and June 2015 appt calendar along with the avs

## 2013-11-20 NOTE — Assessment & Plan Note (Signed)
On 11/14/2013, biopsy confirmed melanoma.

## 2013-11-20 NOTE — Telephone Encounter (Signed)
Pt reports a mass was found in her vagina and she was told it is Melanoma.  She has appt to see Dr. Fermin Schwab on 5/29 but also wants appt to see Dr. Alvy Bimler as soon as possible.

## 2013-11-21 ENCOUNTER — Telehealth: Payer: Self-pay | Admitting: *Deleted

## 2013-11-21 NOTE — Telephone Encounter (Signed)
Pt called to report her PET scan not approved yet and has been r/s from tomorrow to 6/08.  Informed pt that Benedetto Goad is working on Investment banker, operational and has spoken w/ [employer]. Her case is marked urgent.  As soon as we get approval we will see if we can get the PET scan done sooner than 6/08.  Dr. Alvy Bimler notified. Pt understands need to keep appt w/ Dr. Fermin Schwab this Friday 5/29 as scheduled.

## 2013-11-22 ENCOUNTER — Encounter (HOSPITAL_COMMUNITY): Payer: Medicare HMO

## 2013-11-22 ENCOUNTER — Telehealth: Payer: Self-pay | Admitting: *Deleted

## 2013-11-22 NOTE — Telephone Encounter (Signed)
Informed pt of office visit w/ dr. Alvy Bimler to be r/s from 6/05 to 6/09 after PET scan.  Informed if PET gets approved soon and there is a cancellation which allows [place] to move PET up sooner, then we will r/s Dr. Alvy Bimler to see her sooner.  Pt verbalized understanding.

## 2013-11-22 NOTE — Telephone Encounter (Signed)
Message copied by Cathlean Cower on Thu Nov 22, 2013 11:17 AM ------      Message from: [facility], [place]      Created: Wed Nov 21, 2013  7:31 PM      Regarding: RE: PET scan       OK, then move my appt to 6/9 at 1215pm      ----- Message -----         From: Cathlean Cower, RN         Sent: 11/21/2013   3:58 PM           To: Heath Lark, MD      Subject: RE: PET scan                                             PET is not approved yet and they r/s pt from tomorrow to 6/08.  Benedetto Goad says that she has spoken to [facility] and they know it is Urgent but nothing else she can do at this point to get approved sooner. Her only suggestion is for you to call Thressa Sheller [phone] and ask if they will do PET w/o the approval.        ----- Message -----         From: Heath Lark, MD         Sent: 11/20/2013   3:26 PM           To: Lurena Joiner, Phonacelle Malachy Moan, RN      Subject: PET scan                                                 She has melanoma and lymphoma diagnosis, just called radiologist to put her in the urgent list.      Please work on her precert ASAP!             ------

## 2013-11-23 ENCOUNTER — Ambulatory Visit: Payer: Medicare HMO | Attending: Gynecology | Admitting: Gynecology

## 2013-11-23 ENCOUNTER — Telehealth: Payer: Self-pay | Admitting: Hematology and Oncology

## 2013-11-23 ENCOUNTER — Ambulatory Visit: Payer: Medicare HMO | Admitting: Family Medicine

## 2013-11-23 ENCOUNTER — Encounter: Payer: Self-pay | Admitting: Gynecology

## 2013-11-23 VITALS — BP 149/60 | HR 94 | Temp 98.5°F | Resp 16 | Ht 60.04 in | Wt 113.8 lb

## 2013-11-23 DIAGNOSIS — Z79899 Other long term (current) drug therapy: Secondary | ICD-10-CM | POA: Insufficient documentation

## 2013-11-23 DIAGNOSIS — F17200 Nicotine dependence, unspecified, uncomplicated: Secondary | ICD-10-CM | POA: Insufficient documentation

## 2013-11-23 DIAGNOSIS — C8589 Other specified types of non-Hodgkin lymphoma, extranodal and solid organ sites: Secondary | ICD-10-CM | POA: Insufficient documentation

## 2013-11-23 DIAGNOSIS — C52 Malignant neoplasm of vagina: Secondary | ICD-10-CM | POA: Insufficient documentation

## 2013-11-23 NOTE — Progress Notes (Signed)
Consult Note: Gyn-Onc   Colleen Cooke 79 y.o. female  Chief Complaint  Patient presents with  . Vaginal Mass    New Consult    Assessment : Vaginal melanoma  Plan: We will await the results of the PET CT scan scheduled for next week before making final recommendations. I believe that wide local excision would be a reasonable approach to reduce tumor mass and bleeding. However, given the size of the lesion, adjuvant therapy will also be required. We'll collaborate with medical oncology.     HPI: 79 year old white married female seen in consultation at the request of Dr.Anyanwu regarding management of a newly diagnosed melanoma of the vagina. The patient presented with profuse vaginal bleeding. Biopsy of a distal vaginal lesion was obtained on 11/14/2013 and returned as a malignant melanoma. The patient has only had some spotting since then. She denies any pelvic pain or any other symptoms. She has no other significant gynecologic history. It is noted that she has a right adnexal mass that has been present at least since January 2013 and on recent CT scan is unchanged. It measures 3.7 x 2.5 cm with peripheral calcification. (This is most likely benign and not related to the melanoma)  Surgical history gravida 2  The patient is currently undergoing immunotherapy for a non-Hodgkin's lymphoma initially diagnosed in December 2012. Her primary medical oncologist is Dr.Gorsuch.  Review of Systems:10 point review of systems is negative except as noted in interval history.   Vitals: Blood pressure 149/60, pulse 94, temperature 98.5 F (36.9 C), temperature source Oral, resp. rate 16, height 5' 0.04" (1.525 m), weight 113 lb 12.8 oz (51.619 kg).  Physical Exam: General : The patient is a healthy woman in no acute distress.  HEENT: normocephalic, extraoccular movements normal; neck is supple without thyromegally  Lynphnodes: Supraclavicular and inguinal nodes not enlarged  Abdomen: Soft,  non-tender, no ascites, no organomegally, no masses, no hernias  Pelvic:  EGBUS: Normal female  Vagina: There is a 3 x 4 cm exophytic friable lesion arising in the distal vagina on the posterior wall just proximal to the introitus. Inspection of the upper vagina shows no other lesions. Urethra and Bladder: Normal, non-tender  Cervix: Normal Uterus: Small Bi-manual examination: Non-tender; no adenxal masses or nodularity  Rectal: normal sphincter tone, no masses, no blood  Lower extremities: No edema or varicosities. Normal range of motion      Allergies  Allergen Reactions  . Penicillins     REACTION: swelling localized    Past Medical History  Diagnosis Date  . Chicken pox   . Follicular lymphoma 67/3419  . Non-Hodgkin's lymphoma, Burkitt's     Jan. 2013  . Back pain     pt has compression fractures in back  . Wears glasses   . Wears dentures     upper and lower  . Diffuse large B cell lymphoma 04/13/2012    Past Surgical History  Procedure Laterality Date  . Appendectomy      age 4  . Tonsillectomy      age 8  . Mass biopsy  04/04/2012    Procedure: NECK MASS BIOPSY;  Surgeon: Rozetta Nunnery, MD;  Location: [facility];  Service: ENT;  Laterality: Right;    Current Outpatient Prescriptions  Medication Sig Dispense Refill  . alendronate (FOSAMAX) 70 MG tablet Take 70 mg by mouth once a week. Take with a full glass of water on an empty stomach.      Marland Kitchen  ascorbic acid (VITAMIN C) 500 MG tablet Take 500 mg by mouth daily.      . cholecalciferol (VITAMIN D) 1000 UNITS tablet Take 1,000 Units by mouth daily.      Marland Kitchen lidocaine-prilocaine (EMLA) cream Apply topically as needed. Put it on portacath one hour before access.  30 g  2  . MAGNESIUM PO Take 1 tablet by mouth daily.      Marland Kitchen VITAMIN E PO Take 1 capsule by mouth daily.      . Calcium Carbonate (CALCIUM 600 PO) Take 600 mg by mouth daily.       Marland Kitchen POTASSIUM PO Take 1 tablet by mouth daily.        No current facility-administered medications for this visit.   Facility-Administered Medications Ordered in Other Visits  Medication Dose Route Frequency Provider Last Rate Last Dose  . sodium chloride 0.9 % injection 10 mL  10 mL Intravenous PRN Heath Lark, MD   10 mL at 11/07/13 1610    History   Social History  . Marital Status: Married    Spouse Name: N/A    Number of Children: 2  . Years of Education: N/A   Occupational History  . retired Pharmacist, community    Social History Main Topics  . Smoking status: Current Every Day Smoker -- 0.50 packs/day for 20 years    Types: Cigarettes  . Smokeless tobacco: Former Systems developer     Comment: Refused Smoking Cessation Packet.  " Smoke a cigarette every now and then:"  Material reviewed to patient but patient refused.   . Alcohol Use: 4.2 oz/week    7 Cans of beer per week     Comment: one beer a night  . Drug Use: No  . Sexual Activity: Not Currently    Birth Control/ Protection: None   Other Topics Concern  . Not on file   Social History Narrative  . No narrative on file    Family History  Problem Relation Age of Onset  . Coronary artery disease Mother 20      Alvino Chapel, MD 11/23/2013, 11:42 AM

## 2013-11-23 NOTE — Telephone Encounter (Signed)
lvm for pt regarding to June appt move...mailed pt appt sched/and letter

## 2013-11-23 NOTE — Patient Instructions (Signed)
You are scheduled for surgery on June 16 with Dr. Fermin Schwab. You will receive a call from Pre-surgical testing.   Preparing for your Surgery  Pre-operative Testing -You will receive a phone call from presurgical testing at [facility] to arrange for a pre-operative testing appointment before your surgery.  This appointment normally occurs one to two weeks before your scheduled surgery.   -Bring your insurance card, copy of an advanced directive if applicable, medication list  -At that visit, you will be asked to sign a consent for a possible blood transfusion in case a transfusion becomes necessary during surgery.  The need for a blood transfusion is rare but having consent is a necessary part of your care.     Day Before Surgery at [facility] will be asked to take in only clear liquids the day before surgery.  Examples of clear liquids include broths, jello, and clear juices.  You may also be advised to perform a Miralax bowel prep or fleets enema the night before your surgery based off of your provider's recommendations.  You will be advised to have nothing to eat or drink after midnight the evening before.    Your role in recovery Your role is to become active as soon as directed by your doctor, while still giving yourself time to heal.  Rest when you feel tired. You will be asked to do the following in order to speed your recovery:  - Cough and breathe deeply. This helps toclear and expand your lungs and can prevent pneumonia. You may be given a spirometer to practice deep breathing. A staff member will show you how to use the spirometer. - Do mild physical activity. Walking or moving your legs help your circulation and body functions return to normal. A staff member will help you when you try to walk and will provide you with simple exercises. Do not try to get up or walk alone the first time. - Actively manage your pain. Managing your pain lets you move in comfort. We will ask  you to rate your pain on a scale of zero to 10. It is your responsibility to tell your doctor or nurse where and how much you hurt so your pain can be treated.  Special Considerations -If you are diabetic, you may be placed on insulin after surgery to have closer control over your blood sugars to promote healing and recovery.  This does not mean that you will be discharged on insulin.  If applicable, your oral antidiabetics will be resumed when you are tolerating a solid diet.  -Your final pathology results from surgery should be available by the Friday after surgery and the results will be relayed to you when available.

## 2013-11-23 NOTE — ED Provider Notes (Signed)
Medical screening examination/treatment/procedure(s) were conducted as a shared visit with non-physician practitioner(s) and myself.  I personally evaluated the patient during the encounter.   EKG Interpretation None      Patient here with vaginal bleeding. Acutely worsened tonight. Vaginal mass present on exam with active bleeding. CT with stable adnexal mass. Jarrett Soho Muthersbaugh performed vaginal packing at OB/GYN direction to help with bleeding, will f/u soon with GYN.  Osvaldo Shipper, MD 11/23/13 [phone]

## 2013-11-26 NOTE — Progress Notes (Signed)
Pt coming for preop 12/06/13 - will need orders please- thank you

## 2013-11-27 ENCOUNTER — Encounter (HOSPITAL_COMMUNITY): Payer: Self-pay

## 2013-11-28 ENCOUNTER — Telehealth: Payer: Self-pay | Admitting: Hematology and Oncology

## 2013-11-28 NOTE — Telephone Encounter (Signed)
due to pet not until 6/8 per NG 6/5 f/u moved to 6/9. s.w pt she is aware.

## 2013-11-30 ENCOUNTER — Ambulatory Visit: Payer: Medicare HMO | Admitting: Hematology and Oncology

## 2013-11-30 ENCOUNTER — Encounter (HOSPITAL_COMMUNITY): Payer: Self-pay | Admitting: Pharmacy Technician

## 2013-12-03 ENCOUNTER — Encounter (HOSPITAL_COMMUNITY)
Admission: RE | Admit: 2013-12-03 | Discharge: 2013-12-03 | Disposition: A | Discharge status: Home / Self Care | Payer: Medicare HMO | Source: Ambulatory Visit | Attending: Hematology and Oncology | Admitting: Hematology and Oncology

## 2013-12-03 ENCOUNTER — Ambulatory Visit: Payer: Medicare HMO | Admitting: Hematology and Oncology

## 2013-12-03 DIAGNOSIS — C8590 Non-Hodgkin lymphoma, unspecified, unspecified site: Secondary | ICD-10-CM

## 2013-12-03 DIAGNOSIS — C439 Malignant melanoma of skin, unspecified: Secondary | ICD-10-CM | POA: Insufficient documentation

## 2013-12-03 DIAGNOSIS — C8589 Other specified types of non-Hodgkin lymphoma, extranodal and solid organ sites: Secondary | ICD-10-CM | POA: Insufficient documentation

## 2013-12-03 LAB — GLUCOSE, CAPILLARY: Glucose-Capillary: 89 mg/dL (ref 70–99)

## 2013-12-03 MED ORDER — FLUDEOXYGLUCOSE F - 18 (FDG) INJECTION
6.0000 | Freq: Once | INTRAVENOUS | Status: AC | PRN
  Administered 2013-12-03: 6 via INTRAVENOUS

## 2013-12-04 ENCOUNTER — Telehealth: Payer: Self-pay | Admitting: *Deleted

## 2013-12-04 ENCOUNTER — Ambulatory Visit (HOSPITAL_BASED_OUTPATIENT_CLINIC_OR_DEPARTMENT_OTHER): Payer: Commercial Managed Care - HMO | Admitting: Hematology and Oncology

## 2013-12-04 ENCOUNTER — Telehealth: Payer: Self-pay | Admitting: Hematology and Oncology

## 2013-12-04 ENCOUNTER — Ambulatory Visit (HOSPITAL_BASED_OUTPATIENT_CLINIC_OR_DEPARTMENT_OTHER): Payer: Commercial Managed Care - HMO

## 2013-12-04 ENCOUNTER — Encounter: Payer: Self-pay | Admitting: Hematology and Oncology

## 2013-12-04 VITALS — BP 129/42 | HR 86 | Temp 97.6°F | Resp 20 | Ht 60.04 in | Wt 112.6 lb

## 2013-12-04 DIAGNOSIS — C52 Malignant neoplasm of vagina: Secondary | ICD-10-CM

## 2013-12-04 DIAGNOSIS — C8330 Diffuse large B-cell lymphoma, unspecified site: Secondary | ICD-10-CM

## 2013-12-04 DIAGNOSIS — C7952 Secondary malignant neoplasm of bone marrow: Secondary | ICD-10-CM

## 2013-12-04 DIAGNOSIS — C7951 Secondary malignant neoplasm of bone: Secondary | ICD-10-CM

## 2013-12-04 DIAGNOSIS — C8589 Other specified types of non-Hodgkin lymphoma, extranodal and solid organ sites: Secondary | ICD-10-CM

## 2013-12-04 DIAGNOSIS — R110 Nausea: Secondary | ICD-10-CM

## 2013-12-04 HISTORY — DX: Nausea: R11.0

## 2013-12-04 LAB — CBC WITH DIFFERENTIAL/PLATELET
BASO%: 1.4 % (ref 0.0–2.0)
Basophils Absolute: 0 10*3/uL (ref 0.0–0.1)
EOS%: 3 % (ref 0.0–7.0)
Eosinophils Absolute: 0.1 10*3/uL (ref 0.0–0.5)
HCT: 35.6 % (ref 34.8–46.6)
HGB: 11.9 g/dL (ref 11.6–15.9)
LYMPH%: 17.2 % (ref 14.0–49.7)
MCH: 32.9 pg (ref 25.1–34.0)
MCHC: 33.3 g/dL (ref 31.5–36.0)
MCV: 98.9 fL (ref 79.5–101.0)
MONO#: 0.6 10*3/uL (ref 0.1–0.9)
MONO%: 18.4 % — AB (ref 0.0–14.0)
NEUT#: 2 10*3/uL (ref 1.5–6.5)
NEUT%: 60 % (ref 38.4–76.8)
Platelets: 161 10*3/uL (ref 145–400)
RBC: 3.6 10*6/uL — AB (ref 3.70–5.45)
RDW: 13.4 % (ref 11.2–14.5)
WBC: 3.4 10*3/uL — ABNORMAL LOW (ref 3.9–10.3)
lymph#: 0.6 10*3/uL — ABNORMAL LOW (ref 0.9–3.3)

## 2013-12-04 LAB — COMPREHENSIVE METABOLIC PANEL (CC13)
ALBUMIN: 3.7 g/dL (ref 3.5–5.0)
ALT: 11 U/L (ref 0–55)
AST: 19 U/L (ref 5–34)
Alkaline Phosphatase: 81 U/L (ref 40–150)
Anion Gap: 9 mEq/L (ref 3–11)
BUN: 9.7 mg/dL (ref 7.0–26.0)
CALCIUM: 9.2 mg/dL (ref 8.4–10.4)
CHLORIDE: 104 meq/L (ref 98–109)
CO2: 25 mEq/L (ref 22–29)
Creatinine: 0.8 mg/dL (ref 0.6–1.1)
Glucose: 79 mg/dl (ref 70–140)
POTASSIUM: 4 meq/L (ref 3.5–5.1)
Sodium: 139 mEq/L (ref 136–145)
Total Bilirubin: 0.42 mg/dL (ref 0.20–1.20)
Total Protein: 6 g/dL — ABNORMAL LOW (ref 6.4–8.3)

## 2013-12-04 LAB — LACTATE DEHYDROGENASE (CC13): LDH: 393 U/L — AB (ref 125–245)

## 2013-12-04 MED ORDER — PROMETHAZINE HCL 12.5 MG PO TABS
12.5000 mg | ORAL_TABLET | Freq: Four times a day (QID) | ORAL | Status: DC | PRN
Start: 2013-12-04 — End: 2013-12-05

## 2013-12-04 NOTE — Progress Notes (Signed)
[place] OFFICE PROGRESS NOTE  Patient Care Team: Eulas Post, MD as PCP - General Heath Lark, MD as Consulting Physician (Hematology and Oncology)  SUMMARY OF ONCOLOGIC HISTORY: Oncology History   Lymphoma, follicular subtype   Primary site: Lymphoid Neoplasms (Bilateral)   Staging method: AJCC 6th Edition   Clinical: Stage IV (positiv pleural effusion)   Pathologic: Stage IV signed by Heath Lark, MD on 05/16/2013  9:45 AM   Summary: Stage IV  Diffuse large B cell lymphoma, relapsed from transformation   Primary site: Lymphoid Neoplasms (Right)   Staging method: AJCC 6th Edition   Clinical: Stage II signed by Heath Lark, MD on 05/16/2013 10:18 AM   Pathologic: Stage II signed by Heath Lark, MD on 05/16/2013 10:18 AM   Summary: Stage II  Vaginal melanoma   Primary site: Melanoma of the Skin   Staging method: AJCC 7th Edition   Clinical: Stage IV (TX, N0, M1c) signed by Heath Lark, MD on 12/04/2013 10:11 PM   Summary: Stage IV (TX, N0, M1c)         Diffuse large B cell lymphoma   06/09/2011 Procedure LN biopsy confirmed low grade follicular lymphoma   02/29/7168 Initial Diagnosis Lymphoma   07/06/2011 Bone Marrow Biopsy BM was negative   07/08/2011 Imaging Hypermetabolic lymphadenopathy in the neck, chest, abdomen, and pelvis,as well as pleural effusion   07/21/2011 - 09/17/2011 Chemotherapy Patient received 3 cycles of Bendamustine/Rituximab. Chemotherapy was stopped due to new palpable lump and compression fractures with bone pain   10/12/2011 Imaging PET/CT scan confirmed complete response   03/09/2012 Relapse/Recurrence Patient felt an enlarge LN in her neck   03/28/2012 Imaging Repeat PET/CT showed High-grade lymphoma recurrence with intensely hypermetabolic lymph nodes in the right neck.      04/04/2012 Procedure LN biopsy confirmed transformation to DLBCL   04/14/2012 Bone Marrow Biopsy BM biopsy was negative   04/24/2012 - 09/04/2012 Chemotherapy She  completed 5 cycles of mini-RCHOP   09/15/2012 Imaging Repeat PET/CT showed resolved adenopathy in the neck.  No current abnormal extraosseous hypermetabolic activity suspicious for tumor.    11/17/2012 - 09/05/2013 Chemotherapy Patient is started on maintenance Rituximab every other month    Vaginal melanoma   11/13/2013 Imaging CT scan of the pelvis showed abnormalities in the vagina area.   11/14/2013 Procedure Vaginal biopsy showed melanoma.   11/14/2013 Pathology BRAF mutation testing was negative.   12/03/2013 Imaging PET CT scan showed diffuse metastatic disease.    INTERVAL HISTORY: Please see below for problem oriented charting. She denies further bleeding from her vagina. She complained of some nausea but no vomiting.  REVIEW OF SYSTEMS:   Constitutional: Denies fevers, chills or abnormal weight loss Eyes: Denies blurriness of vision Ears, nose, mouth, throat, and face: Denies mucositis or sore throat Respiratory: Denies cough, dyspnea or wheezes Cardiovascular: Denies palpitation, chest discomfort or lower extremity swelling Skin: Denies abnormal skin rashes Lymphatics: Denies new lymphadenopathy or easy bruising Neurological:Denies numbness, tingling or new weaknesses Behavioral/Psych: Mood is stable, no new changes  All other systems were reviewed with the patient and are negative.  I have reviewed the past medical history, past surgical history, social history and family history with the patient and they are unchanged from previous note.  ALLERGIES:  is allergic to penicillins.  MEDICATIONS:  Current Outpatient Prescriptions  Medication Sig Dispense Refill  . alendronate (FOSAMAX) 70 MG tablet Take 70 mg by mouth once a week. Take with a full glass of water  on an empty stomach.      Marland Kitchen ascorbic acid (VITAMIN C) 500 MG tablet Take 500 mg by mouth daily.      . Calcium Carb-Cholecalciferol (CALCIUM 600 + D PO) Take 1 tablet by mouth daily.      . cholecalciferol (VITAMIN D)  1000 UNITS tablet Take 1,000 Units by mouth daily.      Marland Kitchen ibuprofen (ADVIL,MOTRIN) 200 MG tablet Take 200 mg by mouth every 6 (six) hours as needed for mild pain.      Marland Kitchen lidocaine-prilocaine (EMLA) cream Apply topically as needed. Put it on portacath one hour before access.  30 g  2  . Magnesium 250 MG TABS Take 1 tablet by mouth daily as needed (constipation).      . Potassium Gluconate 595 MG CAPS Take 1 capsule by mouth daily.      . vitamin E 400 UNIT capsule Take 400 Units by mouth daily.      . promethazine (PHENERGAN) 12.5 MG tablet Take 1 tablet (12.5 mg total) by mouth every 6 (six) hours as needed for nausea.  30 tablet  3   No current facility-administered medications for this visit.   Facility-Administered Medications Ordered in Other Visits  Medication Dose Route Frequency Provider Last Rate Last Dose  . sodium chloride 0.9 % injection 10 mL  10 mL Intravenous PRN Heath Lark, MD   10 mL at 11/07/13 0943    PHYSICAL EXAMINATION: ECOG PERFORMANCE STATUS: 1 - Symptomatic but completely ambulatory  Filed Vitals:   12/04/13 1203  BP: 129/42  Pulse: 86  Temp: 97.6 F (36.4 C)  Resp: 20   Filed Weights   12/04/13 1203  Weight: 112 lb 9.6 oz (51.075 kg)    GENERAL:alert, no distress and comfortable. She looks thin and mildly cachectic SKIN: skin color, texture, turgor are normal, no rashes or significant lesions EYES: normal, Conjunctiva are pink and non-injected, sclera clear Musculoskeletal:no cyanosis of digits and no clubbing  NEURO: alert & oriented x 3 with fluent speech, no focal motor/sensory deficits  LABORATORY DATA:  I have reviewed the data as listed    Component Value Date/Time   NA 139 12/04/2013 1325   NA 136* 11/13/2013 1840   K 4.0 12/04/2013 1325   K 4.4 11/13/2013 1840   CL 97 11/13/2013 1840   CL 102 11/17/2012 0820   CO2 25 12/04/2013 1325   CO2 27 11/13/2013 1840   GLUCOSE 79 12/04/2013 1325   GLUCOSE 107* 11/13/2013 1840   GLUCOSE 98 11/17/2012 0820    BUN 9.7 12/04/2013 1325   BUN 11 11/13/2013 1840   CREATININE 0.8 12/04/2013 1325   CREATININE 0.81 11/13/2013 1840   CALCIUM 9.2 12/04/2013 1325   CALCIUM 10.1 11/13/2013 1840   PROT 6.0* 12/04/2013 1325   PROT 5.0* 07/04/2012 0600   ALBUMIN 3.7 12/04/2013 1325   ALBUMIN 2.7* 07/04/2012 0600   AST 19 12/04/2013 1325   AST 9 07/04/2012 0600   ALT 11 12/04/2013 1325   ALT 17 07/04/2012 0600   ALKPHOS 81 12/04/2013 1325   ALKPHOS 102 07/04/2012 0600   BILITOT 0.42 12/04/2013 1325   BILITOT 0.7 07/04/2012 0600   GFRNONAA 69* 11/13/2013 1840   GFRAA 80* 11/13/2013 1840    No results found for this basename: SPEP, UPEP,  kappa and lambda light chains    Lab Results  Component Value Date   WBC 3.4* 12/04/2013   NEUTROABS 2.0 12/04/2013   HGB 11.9 12/04/2013   HCT  35.6 12/04/2013   MCV 98.9 12/04/2013   PLT 161 12/04/2013      Chemistry      Component Value Date/Time   NA 139 12/04/2013 1325   NA 136* 11/13/2013 1840   K 4.0 12/04/2013 1325   K 4.4 11/13/2013 1840   CL 97 11/13/2013 1840   CL 102 11/17/2012 0820   CO2 25 12/04/2013 1325   CO2 27 11/13/2013 1840   BUN 9.7 12/04/2013 1325   BUN 11 11/13/2013 1840   CREATININE 0.8 12/04/2013 1325   CREATININE 0.81 11/13/2013 1840      Component Value Date/Time   CALCIUM 9.2 12/04/2013 1325   CALCIUM 10.1 11/13/2013 1840   ALKPHOS 81 12/04/2013 1325   ALKPHOS 102 07/04/2012 0600   AST 19 12/04/2013 1325   AST 9 07/04/2012 0600   ALT 11 12/04/2013 1325   ALT 17 07/04/2012 0600   BILITOT 0.42 12/04/2013 1325   BILITOT 0.7 07/04/2012 0600       RADIOGRAPHIC STUDIES: I have personally reviewed the radiological images as listed and agreed with the findings in the report. Nm Pet Image Initial (pi) Whole Body  12/03/2013   CLINICAL DATA:  Initial treatment strategy for melanoma of the vagina. Additional history of lymphoma.  EXAM: NUCLEAR MEDICINE PET WHOLE BODY  TECHNIQUE: 6.0 mCi F-18 FDG was injected intravenously. CT data was obtained and used for attenuation correction and anatomic  localization only. (This was not acquired as a diagnostic CT examination.) Additional exam technical data entered on technologist worksheet.  FASTING BLOOD GLUCOSE:  Value: 89 mg/dl  COMPARISON:  CT of the abdomen and pelvis 11/13/2013. PET-CT 09/15/2012.  FINDINGS: Head/Neck: No hypermetabolic lymph nodes in the neck.  Chest: No hypermetabolic mediastinal or hilar nodes. No suspicious pulmonary nodules on the CT scan. Right internal jugular single-lumen porta cath with tip terminating in right atrium There is atherosclerosis of the thoracic aorta, the great vessels of the mediastinum and the coronary arteries, including calcified atherosclerotic plaque in the left anterior descending and left circumflex and right coronary arteries. Small amount of scarring in the medial segment of the right middle lobe. No consolidative airspace disease or pleural effusions.  Abdomen/Pelvis: Distortion of soft tissue planes in the region of the inferior aspect of the vagina were there is diffuse hypermetabolism associated with the lower vaginal mucosa (SUVmax = 9.1) , and some partially calcified hypermetabolic soft tissue which extends anteriorly to partially encase the urethra, compatible with the reported melanoma of the vagina. No definite surrounding pelvic lymphadenopathy.  No abnormal hypermetabolic activity within the liver, pancreas, adrenal glands, or spleen. No hypermetabolic lymph nodes in the abdomen. No significant volume of ascites. No pneumoperitoneum. No pathologic distention of small bowel. Atherosclerosis throughout the abdominal and pelvic vasculature, without evidence of aneurysm  Skeleton: There are multiple foci of hypermetabolism within the visualized axial and appendicular skeleton. Specific examples include the following. Lateral aspect of the right fourth rib (SUVmax = 2.8). Lateral aspect of the left fifth rib (SUVmax = 2.0). T9 and right transverse process where there is focal sclerosis (SUVmax = 3.9).  Anterior superior endplate of L5 (SUVmax = 3.9). Left iliac crest anteriorly (image 157 of series 4) where there is a subtle lucent lesion (SUVmax = 5.8). And the superior aspect of the right iliac crest just lateral to the sacroiliac joint where there is a subtle area of sclerosis on image 152 of series 4 (SUVmax = 3.8). Multiple old vertebral body compression fractures are noted  involving T12, L2, L3-L4, most severe at T12 where there is a vertebra plana appearance. These fractures are similar to recent prior examination 11/13/2013.  Extremeties: No hypermetabolic activity to suggest metastasis.  IMPRESSION: 1. Ill-defined hypermetabolic mass in the lower aspect of the vagina with apparent anterior extension partially encasing the urethra corresponding to biopsy-proven malignant melanoma. 2. Multiple foci of skeletal hypermetabolism concerning for metastatic disease, as above. 3. Multiple old vertebral body compression fractures, most severe at T12 where there is vertebra plana. 4.  Atherosclerosis, including three-vessel coronary artery disease. 5. Additional incidental findings, as above.   Electronically Signed   By: Vinnie Langton M.D.   On: 12/03/2013 15:44     ASSESSMENT & PLAN:  Vaginal melanoma I have extensive discussion with the patient, her surgeon and her daughter. She has extensive local disease as well as metastatic disease to the bone. Treatment will be palliative only. I will order an imaging study of her brain to exclude brain metastasis. I recommend we proceed with systemic treatment next week. I discussed with her the risk, benefits and side effects of Nivolumab versus Ipilimumab. Depending on her insurance approval, I will proceed with one of the immunotherapy next week. I will present her case at the next hematology tumor board to review her scans.   Diffuse large B cell lymphoma Lymphoma has not recurred. I recommend observation only for this.  Nausea alone Because this  hormone. I gave her prescriptions Phenergan. I will order imaging study of her head to exclude brain metastasis.   Orders Placed This Encounter  Procedures  . MR Brain W Wo Contrast    Standing Status: Future     Number of Occurrences:      Standing Expiration Date: 12/04/2014    Order Specific Question:  Reason for exam:    Answer:  staging melanoma, exclude brain mets    Order Specific Question:  Preferred imaging location?    Answer:  [facility]    Order Specific Question:  Does the patient have a pacemaker or implanted devices?    Answer:  Yes  . Comprehensive metabolic panel    Standing Status: Future     Number of Occurrences: 1     Standing Expiration Date: 12/04/2014  . CBC with Differential    Standing Status: Future     Number of Occurrences: 1     Standing Expiration Date: 12/04/2014  . Lactate dehydrogenase    Standing Status: Future     Number of Occurrences: 1     Standing Expiration Date: 12/04/2014   All questions were answered. The patient knows to call the clinic with any problems, questions or concerns. No barriers to learning was detected. I spent 40 minutes counseling the patient face to face. The total time spent in the appointment was 60 minutes and more than 50% was on counseling and review of test results     Heath Lark, MD 12/04/2013 10:21 PM

## 2013-12-04 NOTE — Assessment & Plan Note (Signed)
Because this hormone. I gave her prescriptions Phenergan. I will order imaging study of her head to exclude brain metastasis.

## 2013-12-04 NOTE — Telephone Encounter (Signed)
I spoke with the patient over the phone. I have discussed the case with Dr. Aldean Ast and we both agree surgery is not due to be beneficial for the patient. I am requesting her insurance to approve Nivolumab although according to current FDA guidelines, she has to be treated with Ipilimumab first. The NCCN guidelines has suggested Nivolumab as a first-line treatment. Depending on her insurance approval, the patient is aware she may either get treated with Nivolumab or Ipilimumab. I will call her next week once I know more.

## 2013-12-04 NOTE — Assessment & Plan Note (Signed)
Lymphoma has not recurred. I recommend observation only for this.

## 2013-12-04 NOTE — Assessment & Plan Note (Addendum)
I have extensive discussion with the patient, her surgeon and her daughter. She has extensive local disease as well as metastatic disease to the bone. Treatment will be palliative only. I will order an imaging study of her brain to exclude brain metastasis. I recommend we proceed with systemic treatment next week. I discussed with her the risk, benefits and side effects of Nivolumab versus Ipilimumab. Depending on her insurance approval, I will proceed with one of the immunotherapy next week. I will present her case at the next hematology tumor board to review her scans.

## 2013-12-04 NOTE — Telephone Encounter (Signed)
DOne

## 2013-12-04 NOTE — Telephone Encounter (Signed)
VM left by pt's step- daughter who is Therapist, sports.  She asks to speak w/ Dr. Alvy Bimler about plan of care and treatment options.  Says that they plan to bring pt back to [place] to help take care of her.  She asks if Dr. Alvy Bimler feels they should have pt move with them to [place] soon or wait until after treatment?  Home [phone].  Cell [phone].

## 2013-12-04 NOTE — Telephone Encounter (Signed)
I spoke with pt's step- daughter who is Therapist, sports. We discussed about plan of care and treatment options. Home [phone]. Cell [phone].  The patient's daughter plan to move the parents to [place], [place] in the near future which I think is reasonable, given she has stage IV disease and treatment goals is palliative only. Addressed all questions.

## 2013-12-04 NOTE — Telephone Encounter (Signed)
Gave pt appt for lab,md and chemo for june and July 2015 °

## 2013-12-04 NOTE — Telephone Encounter (Signed)
Per staff phone call and POF I have schedueld appts.  JMW  

## 2013-12-05 ENCOUNTER — Telehealth: Payer: Self-pay | Admitting: *Deleted

## 2013-12-05 DIAGNOSIS — R110 Nausea: Secondary | ICD-10-CM

## 2013-12-05 MED ORDER — ONDANSETRON HCL 4 MG PO TABS: 4.0000 mg | ORAL_TABLET | Freq: Four times a day (QID) | ORAL | Status: DC | PRN

## 2013-12-05 MED ORDER — PROMETHAZINE HCL 12.5 MG PO TABS: 12.5000 mg | ORAL_TABLET | Freq: Four times a day (QID) | ORAL | Status: AC | PRN

## 2013-12-05 NOTE — Telephone Encounter (Signed)
Spoke w/ pt about phenergan Rx. She states it cost her $43 at [facility] so she refused to get it.  She says she usually gets her meds from [facility].  Re-sent phenergan and zofran to [facility] mail order pharmacy and instructed pt to check w/ them about pricing.  She verbalized understanding.

## 2013-12-05 NOTE — Telephone Encounter (Signed)
Message copied by Cathlean Cower on Wed Dec 05, 2013 11:59 AM ------      Message from: [facility], [place]      Created: Tue Dec 04, 2013 10:18 PM      Regarding: anti emetics       Daughter told me she did not pick up her script because the Phenergan is too expensive. Can you check if Zofran may be cheaper with her insurance.      You can call in zofran 4 mg q 6 hr prn for nausea, 30 tabs 6 refills ------

## 2013-12-06 ENCOUNTER — Inpatient Hospital Stay (HOSPITAL_COMMUNITY): Admission: RE | Admit: 2013-12-06 | Payer: Medicare HMO | Source: Ambulatory Visit

## 2013-12-07 ENCOUNTER — Telehealth: Payer: Self-pay | Admitting: *Deleted

## 2013-12-07 NOTE — Telephone Encounter (Signed)
Dau called w/ information re Oncologist she would like pt to be referred to Dr. Osborn Coho at [facility] and Hematology.  [address] [place] [address].   Ph #[phone].  She plans on moving pt after she starts treatment here next week. She asks if the Treatment will be every 3 weeks or every 4 weeks?

## 2013-12-08 NOTE — Telephone Encounter (Signed)
I won't know what Rx to start until Benjamine Mola let us know what her insurance would approve either Ipi or Nivo

## 2013-12-10 ENCOUNTER — Encounter: Payer: Self-pay | Admitting: *Deleted

## 2013-12-10 ENCOUNTER — Other Ambulatory Visit: Payer: Self-pay | Admitting: Hematology and Oncology

## 2013-12-10 ENCOUNTER — Telehealth: Payer: Self-pay | Admitting: *Deleted

## 2013-12-10 NOTE — Progress Notes (Signed)
RECEIVED A FAX FROM [employer] CONCERNING A PRIOR AUTHORIZATION FOR ONDANSETRON. THIS REQUEST WAS PLACED IN THE MANAGED CARE BIN.

## 2013-12-10 NOTE — Progress Notes (Signed)
RECEIVED A FAX FROM [employer] CONCERNING A PRIOR AUTHORIZATION FOR PROMETHAZINE. THIS REQUEST WAS PLACED IN THE MANAGED CARE BIN.

## 2013-12-10 NOTE — Telephone Encounter (Signed)
Daughter left VM asking if Dr. Alvy Bimler knows which drug "immunotherapy" pt will get tomorrow and when her next treatment will be due?  She is trying to arrange plane reservations for pt to move back to [place]. She is hoping to get pt referred to see Dr. Jonelle Sidle in [place] by the time her next treatment is due.  She asks if Dr. Alvy Bimler can call her?  Ph [phone].

## 2013-12-10 NOTE — Telephone Encounter (Signed)
Informed pt of Nivolumab approved to start tomorrow as scheduled.  This is scheduled every 2 weeks.  Dr. Alvy Bimler has left VM w/ pt's daughter to discuss the move.  I have requested our Medical Records sent records/ make referral to Dr. Jonelle Sidle in [place].  Pt verbalized understanding.

## 2013-12-11 ENCOUNTER — Ambulatory Visit (HOSPITAL_COMMUNITY): Admission: RE | Admit: 2013-12-11 | Payer: Medicare HMO | Source: Ambulatory Visit | Admitting: Gynecology

## 2013-12-11 ENCOUNTER — Ambulatory Visit (HOSPITAL_BASED_OUTPATIENT_CLINIC_OR_DEPARTMENT_OTHER): Payer: Commercial Managed Care - HMO

## 2013-12-11 ENCOUNTER — Encounter (HOSPITAL_COMMUNITY): Admission: RE | Payer: Self-pay | Source: Ambulatory Visit

## 2013-12-11 ENCOUNTER — Other Ambulatory Visit: Payer: Medicare HMO

## 2013-12-11 VITALS — BP 124/45 | HR 88 | Temp 98.6°F | Resp 18

## 2013-12-11 DIAGNOSIS — C8330 Diffuse large B-cell lymphoma, unspecified site: Secondary | ICD-10-CM

## 2013-12-11 DIAGNOSIS — C8589 Other specified types of non-Hodgkin lymphoma, extranodal and solid organ sites: Secondary | ICD-10-CM

## 2013-12-11 DIAGNOSIS — C52 Malignant neoplasm of vagina: Secondary | ICD-10-CM

## 2013-12-11 DIAGNOSIS — C7951 Secondary malignant neoplasm of bone: Secondary | ICD-10-CM

## 2013-12-11 DIAGNOSIS — C7952 Secondary malignant neoplasm of bone marrow: Secondary | ICD-10-CM

## 2013-12-11 SURGERY — EXCISION, CYST, VAGINA
Anesthesia: Choice

## 2013-12-11 MED ORDER — SODIUM CHLORIDE 0.9 % IV SOLN
3.0000 mg/kg | Freq: Once | INTRAVENOUS | Status: AC
  Administered 2013-12-11: 150 mg via INTRAVENOUS
  Filled 2013-12-11: qty 15

## 2013-12-11 MED ORDER — HEPARIN SOD (PORK) LOCK FLUSH 100 UNIT/ML IV SOLN
500.0000 [IU] | Freq: Once | INTRAVENOUS | Status: AC | PRN
  Administered 2013-12-11: 500 [IU]
  Filled 2013-12-11: qty 5

## 2013-12-11 MED ORDER — SODIUM CHLORIDE 0.9 % IV SOLN
Freq: Once | INTRAVENOUS | Status: AC
  Administered 2013-12-11: 15:00:00 via INTRAVENOUS

## 2013-12-11 MED ORDER — SODIUM CHLORIDE 0.9 % IJ SOLN
10.0000 mL | INTRAMUSCULAR | Status: DC | PRN
  Administered 2013-12-11: 10 mL
  Filled 2013-12-11: qty 10

## 2013-12-11 NOTE — Patient Instructions (Signed)
Call md for any problems

## 2013-12-12 ENCOUNTER — Ambulatory Visit (HOSPITAL_COMMUNITY)
Admission: RE | Admit: 2013-12-12 | Discharge: 2013-12-12 | Disposition: A | Discharge status: Home / Self Care | Payer: Medicare HMO | Source: Ambulatory Visit | Attending: Hematology and Oncology | Admitting: Hematology and Oncology

## 2013-12-12 ENCOUNTER — Encounter: Payer: Self-pay | Admitting: Hematology and Oncology

## 2013-12-12 ENCOUNTER — Encounter: Payer: Self-pay | Admitting: Radiation Oncology

## 2013-12-12 ENCOUNTER — Other Ambulatory Visit: Payer: Self-pay | Admitting: Hematology and Oncology

## 2013-12-12 ENCOUNTER — Telehealth: Payer: Self-pay | Admitting: *Deleted

## 2013-12-12 DIAGNOSIS — G936 Cerebral edema: Secondary | ICD-10-CM | POA: Insufficient documentation

## 2013-12-12 DIAGNOSIS — C7931 Secondary malignant neoplasm of brain: Secondary | ICD-10-CM | POA: Insufficient documentation

## 2013-12-12 DIAGNOSIS — C7949 Secondary malignant neoplasm of other parts of nervous system: Secondary | ICD-10-CM

## 2013-12-12 DIAGNOSIS — C52 Malignant neoplasm of vagina: Secondary | ICD-10-CM

## 2013-12-12 DIAGNOSIS — G319 Degenerative disease of nervous system, unspecified: Secondary | ICD-10-CM | POA: Insufficient documentation

## 2013-12-12 DIAGNOSIS — R269 Unspecified abnormalities of gait and mobility: Secondary | ICD-10-CM | POA: Insufficient documentation

## 2013-12-12 HISTORY — DX: Secondary malignant neoplasm of brain: C79.31

## 2013-12-12 MED ORDER — DEXAMETHASONE 4 MG PO TABS: 4.0000 mg | ORAL_TABLET | Freq: Three times a day (TID) | ORAL | Status: DC

## 2013-12-12 MED ORDER — GADOBENATE DIMEGLUMINE 529 MG/ML IV SOLN
10.0000 mL | Freq: Once | INTRAVENOUS | Status: AC | PRN
  Administered 2013-12-12: 10 mL via INTRAVENOUS

## 2013-12-12 NOTE — Telephone Encounter (Signed)
I reviewed the MRI test results with the patient and her stepdaughter. Unfortunately, the patient has innumerable brain metastasis, which could explain her symptoms of nausea. I recommend that her to start dexamethasone 4 mg 3 times a day and I have placed urgent radiation oncology consultation for palliative radiation therapy. Her stepdaughter told me they plan to move her to [place] on July 6.

## 2013-12-12 NOTE — Progress Notes (Signed)
Faxed ondansetron pa form to [facility]

## 2013-12-12 NOTE — Progress Notes (Signed)
  Radiation Oncology         [phone] ________________________________  Name: Colleen Cooke  MRN: [id number]  Date: 12/12/2013  DOB: 11-26-1934  Telephone contact:  Receiving a message from [place], I reviewed this patient's most recent brain MRI. It shows innumerable ring metastases with hemosiderin deposits. She has a background history of lymphoma as well as melanoma in the current brain MRI would be suggestive of innumerable brain metastases from metastatic melanoma which are all subcentimeter in size. The patient has had headaches and was started on dexamethasone at 4 mg 3 times daily.  I think this patient would likely benefit from whole brain radiotherapy to help stabilize brain metastases. By telephone her home this evening to discuss the potential for whole brain radiotherapy and did not receive an answer. I will continue to work on making contact with the patient and potentially scheduling her to set up a clinic visit with radiation planning.   ________________________________  Sheral Apley. Tammi Klippel, M.D.

## 2013-12-12 NOTE — Telephone Encounter (Signed)
Received prior auth information on this pt & given to [employer].

## 2013-12-13 ENCOUNTER — Encounter: Payer: Self-pay | Admitting: Hematology and Oncology

## 2013-12-13 ENCOUNTER — Ambulatory Visit
Admission: RE | Admit: 2013-12-13 | Discharge: 2013-12-13 | Disposition: A | Discharge status: Home / Self Care | Payer: Medicare HMO | Source: Ambulatory Visit | Attending: Radiation Oncology | Admitting: Radiation Oncology

## 2013-12-13 ENCOUNTER — Other Ambulatory Visit: Payer: Self-pay | Admitting: *Deleted

## 2013-12-13 ENCOUNTER — Telehealth: Payer: Self-pay

## 2013-12-13 ENCOUNTER — Encounter: Payer: Self-pay | Admitting: Radiation Oncology

## 2013-12-13 VITALS — BP 128/53 | HR 89 | Resp 16 | Ht 60.0 in | Wt 110.6 lb

## 2013-12-13 DIAGNOSIS — C8589 Other specified types of non-Hodgkin lymphoma, extranodal and solid organ sites: Secondary | ICD-10-CM | POA: Diagnosis not present

## 2013-12-13 DIAGNOSIS — C7949 Secondary malignant neoplasm of other parts of nervous system: Secondary | ICD-10-CM

## 2013-12-13 DIAGNOSIS — C7931 Secondary malignant neoplasm of brain: Secondary | ICD-10-CM | POA: Diagnosis not present

## 2013-12-13 DIAGNOSIS — M549 Dorsalgia, unspecified: Secondary | ICD-10-CM | POA: Insufficient documentation

## 2013-12-13 DIAGNOSIS — C52 Malignant neoplasm of vagina: Secondary | ICD-10-CM

## 2013-12-13 DIAGNOSIS — Z510 Encounter for antineoplastic radiation therapy: Secondary | ICD-10-CM | POA: Insufficient documentation

## 2013-12-13 DIAGNOSIS — F17200 Nicotine dependence, unspecified, uncomplicated: Secondary | ICD-10-CM | POA: Insufficient documentation

## 2013-12-13 DIAGNOSIS — C8330 Diffuse large B-cell lymphoma, unspecified site: Secondary | ICD-10-CM

## 2013-12-13 DIAGNOSIS — C719 Malignant neoplasm of brain, unspecified: Secondary | ICD-10-CM

## 2013-12-13 DIAGNOSIS — C439 Malignant melanoma of skin, unspecified: Secondary | ICD-10-CM | POA: Insufficient documentation

## 2013-12-13 HISTORY — DX: Unspecified malignant neoplasm of skin, unspecified: C44.90

## 2013-12-13 MED ORDER — ONDANSETRON HCL 8 MG PO TABS: 8.0000 mg | ORAL_TABLET | Freq: Two times a day (BID) | ORAL | Status: AC | PRN

## 2013-12-13 NOTE — Progress Notes (Signed)
[employer] has denied ondansetron 4mg  because they only allow a patient to get 90 in 30 days and this patient is to take 1 po every 6 hours prn, which comes out to 28 every 7 days.  They believe she should be able to take the 8mg  less often.

## 2013-12-13 NOTE — Progress Notes (Signed)
See progress note under physician encounter. 

## 2013-12-13 NOTE — Telephone Encounter (Signed)
Returned call to pt stepdaughter.  Confirmed with Mrs. Nanetta Batty that pt is on Dexamethason,  Reviewed Dr. Johny Shears note with her regarding radiation treatment and immunotherapy, reviewed patient rad schedule with patient as they are trying to transfer pt to [facility].  Let her know ofc notes do not reflect prophylactic anti-seizure meds.  Let her know to call if she needs more information for her or for Dr. Jeannie Fend.  She verbalized understanding and appreciation.

## 2013-12-13 NOTE — Progress Notes (Signed)
Denies headache or dizziness. Reports nausea but, denies emesis. Reports taking zofran as directed. Sharp and quick when questioned but, gets lost in train of thought occasionally. Very off balance and unsteady on her feet. Reports original melanoma was found in vaginal wall by Fermin Schwab. Denies ringing in the ears or vision changes. Reports chronic pain related to numerous old spinal fractures. Reports taking decadron 4 mg tid. Vitals stable.

## 2013-12-13 NOTE — Progress Notes (Signed)
Radiation Oncology         [phone]) [phone] ________________________________  Initial outpatient Consultation  Name: Colleen Cooke MRN: [id number]  Date: 12/13/2013  DOB: 1935-05-25  CZ:YSAYTKZSW,FUXNA W, MD  Heath Lark, MD   REFERRING PHYSICIAN: Heath Lark, MD  DIAGNOSIS: 79 yo woman with innumerable brain metastases from metastatic melanoma - stage IV  HISTORY OF PRESENT ILLNESS::Colleen Cooke is a 79 y.o. 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  She was treated with 3 cycles of Bendamustine/Rituxan between Jan 2013 and March 2013.  She developed transformed DLBCL and was treated with 5 cycles of mini-RCHOP between 04/24/2012 and 09/04/2012. She was in remission and receiving maintenance rituximab every other month until 09/05/13.  In May, 2015, the patient presented with profuse vaginal bleeding. Biopsy of a distal vaginal lesion was obtained on 11/14/2013 and returned as a malignant melanoma. PET-CT on 6/8 showed Ill-defined hypermetabolic mass in the lower aspect of the vagina with apparent anterior extension partially encasing the urethra corresponding to biopsy-proven malignant melanoma. It also showed multiple foci of skeletal hypermetabolism concerning for metastatic disease.  She was in the process of being set-up for immunotherapy, when she reported nausea and unsteady gait.  MRI of the brain on 6/17 showed innumerable brain mets and she was started on dexamethasone and referred to discuss radiation.  PREVIOUS RADIATION THERAPY: No  PAST MEDICAL HISTORY:  has a past medical history of Chicken pox; Follicular lymphoma (35/5732); Non-Hodgkin's lymphoma, Burkitt's; Back pain; Wears glasses; Wears dentures; Diffuse large B cell lymphoma (04/13/2012); Nausea alone (12/04/2013); Metastasis to brain (12/12/2013); and Skin cancer.    PAST SURGICAL HISTORY: Past Surgical History  Procedure Laterality Date  . Appendectomy      age 7  .  Tonsillectomy      age 66  . Mass biopsy  04/04/2012    Procedure: NECK MASS BIOPSY;  Surgeon: Rozetta Nunnery, MD;  Location: [facility];  Service: ENT;  Laterality: Right;    FAMILY HISTORY: family history includes Coronary artery disease (age of onset: 5) in her mother. There is no history of Cancer.  SOCIAL HISTORY:  reports that she has been smoking Cigarettes.  She has a 10 pack-year smoking history. She has quit using smokeless tobacco. She reports that she drinks about 4.2 ounces of alcohol per week. She reports that she does not use illicit drugs.  ALLERGIES: Penicillins  MEDICATIONS:  Current Outpatient Prescriptions  Medication Sig Dispense Refill  . alendronate (FOSAMAX) 70 MG tablet Take 70 mg by mouth once a week. Take with a full glass of water on an empty stomach.      Marland Kitchen ascorbic acid (VITAMIN C) 500 MG tablet Take 500 mg by mouth daily.      . Calcium Carb-Cholecalciferol (CALCIUM 600 + D PO) Take 1 tablet by mouth daily.      . cholecalciferol (VITAMIN D) 1000 UNITS tablet Take 1,000 Units by mouth daily.      Marland Kitchen dexamethasone (DECADRON) 4 MG tablet Take 1 tablet (4 mg total) by mouth 3 (three) times daily.  90 tablet  0  . ibuprofen (ADVIL,MOTRIN) 200 MG tablet Take 200 mg by mouth every 6 (six) hours as needed for mild pain.      Marland Kitchen lidocaine-prilocaine (EMLA) cream Apply topically as needed. Put it on portacath one hour before access.  30 g  2  . Magnesium 250 MG TABS Take 1 tablet by mouth daily as  needed (constipation).      . ondansetron (ZOFRAN) 4 MG tablet Take 1 tablet (4 mg total) by mouth every 6 (six) hours as needed for nausea or vomiting.  30 tablet  6  . Potassium Gluconate 595 MG CAPS Take 1 capsule by mouth daily.      . vitamin E 400 UNIT capsule Take 400 Units by mouth daily.      . promethazine (PHENERGAN) 12.5 MG tablet Take 1 tablet (12.5 mg total) by mouth every 6 (six) hours as needed for nausea.  30 tablet  3   No current  facility-administered medications for this encounter.   Facility-Administered Medications Ordered in Other Encounters  Medication Dose Route Frequency Provider Last Rate Last Dose  . sodium chloride 0.9 % injection 10 mL  10 mL Intravenous PRN Heath Lark, MD   10 mL at 11/07/13 0943    REVIEW OF SYSTEMS:  A 15 point review of systems is documented in the electronic medical record. This was obtained by the nursing staff. However, I reviewed this with the patient to discuss relevant findings and make appropriate changes.  Pertinent items are noted in HPI.   PHYSICAL EXAM:  height is 5' (1.524 m) and weight is 110 lb 9.6 oz (50.168 kg). Her blood pressure is 128/53 and her pulse is 89. Her respiration is 16.   Per medical oncology: GENERAL:alert, no distress and comfortable. She looks thin and mildly cachectic SKIN: skin color, texture, turgor are normal, no rashes or significant lesions  EYES: normal, Conjunctiva are pink and non-injected, sclera clear Musculoskeletal:no cyanosis of digits and no clubbing NEURO: alert & oriented x 3 with fluent speech, no focal motor/sensory deficits  KPS = 90  100 - Normal; no complaints; no evidence of disease. 90   - Able to carry on normal activity; minor signs or symptoms of disease. 80   - Normal activity with effort; some signs or symptoms of disease. 35   - Cares for self; unable to carry on normal activity or to do active work. 60   - Requires occasional assistance, but is able to care for most of his personal needs. 50   - Requires considerable assistance and frequent medical care. 48   - Disabled; requires special care and assistance. 65   - Severely disabled; hospital admission is indicated although death not imminent. 56   - Very sick; hospital admission necessary; active supportive treatment necessary. 10   - Moribund; fatal processes progressing rapidly. 0     - Dead  Karnofsky DA, Abelmann Shafter, Craver LS and Burchenal [facility] [phone]) The use of the  nitrogen mustards in the palliative treatment of carcinoma: with particular reference to bronchogenic carcinoma Cancer 1 [id number]  LABORATORY DATA:  Lab Results  Component Value Date   WBC 3.4* 12/04/2013   HGB 11.9 12/04/2013   HCT 35.6 12/04/2013   MCV 98.9 12/04/2013   PLT 161 12/04/2013   Lab Results  Component Value Date   NA 139 12/04/2013   K 4.0 12/04/2013   CL 97 11/13/2013   CO2 25 12/04/2013   Lab Results  Component Value Date   ALT 11 12/04/2013   AST 19 12/04/2013   ALKPHOS 81 12/04/2013   BILITOT 0.42 12/04/2013     RADIOGRAPHY: Mr Jeri Cos Wo Contrast  12/12/2013   CLINICAL DATA:  History of melanoma of the vagina. Gait difficulty. Staging.  EXAM: MRI HEAD WITHOUT AND WITH CONTRAST  TECHNIQUE: Multiplanar, multiecho pulse sequences of the  brain and surrounding structures were obtained without and with intravenous contrast.  CONTRAST:  61mL MULTIHANCE GADOBENATE DIMEGLUMINE 529 MG/ML IV SOLN  COMPARISON:  PET scan 12/03/2013.  FINDINGS: Diffusion-weighted imaging shows small foci of restricted diffusion in the interhemispheric fissure, in the cortex over the convexity, and in the parasagittal regions of the posterior frontal and parietal cortex, which actually represent tumor, not infarction. T2 and FLAIR images demonstrate mild cerebral and cerebellar atrophy with only minor changes of white matter disease. Gradient sequence demonstrates subcentimeter areas of T2 shortening, within the cortex, subarachnoid space, and gray-white junction of both cerebral hemispheres consistent with melanoma metastatic disease. They display predominantly T2, but not T1, shortening due to melanin. Many are surrounded by a halo of vasogenic edema. Post infusion, there are innumerable metastases throughout the brain, much greater than would be predicted from the T2* sequence, concentrated in the basal ganglia, thalamus, and subarachnoid spaces as well as scattered throughout the predominantly supratentorial gray-white  junction locations. Index lesion noted in the right posterior frontal cortex (versus deep within a sulcus), spherical measuring 5 mm. Widespread other lesions as small as 1 mm scattered throughout the brain. An area of 6 mm T2* effect in the medial right posterior parietal cortex image 16 series 8, is associated with a much smaller area of enhancement, and could represent hemorrhage from a melanoma metastasis.  No midline shift. No impending herniation. Slight heterogeneity of the diploic space over the left frontal region could represent early osseous metastatic disease, although no inner or outer cortex disruption. Flow voids are maintained. No acute sinus or mastoid disease.  IMPRESSION: Innumerable intracranial metastatic lesions consistent with melanotic melanoma spread to the brain. Index lesion in the right posterior frontal cortex measuring 5 mm.  Possible superimposed hemorrhage of a right posterior parietal cortex lesion with T2* effect greater than area of enhancement.  No impending herniation.  These results will be called to the ordering clinician or representative by the Radiologist Assistant, and communication documented in the PACS or zVision Dashboard.   Electronically Signed   By: Rolla Flatten M.D.   On: 12/12/2013 14:32   Ct Abdomen Pelvis W Contrast  11/13/2013   CLINICAL DATA:  Abnormal vaginal bleeding and abdominal distention. History of lymphoma.  EXAM: CT ABDOMEN AND PELVIS WITH CONTRAST  TECHNIQUE: Multidetector CT imaging of the abdomen and pelvis was performed using the standard protocol following bolus administration of intravenous contrast.  CONTRAST:  170mL OMNIPAQUE IOHEXOL 300 MG/ML  SOLN  COMPARISON:  CT scan dated 07/08/2011 and PET-CT scan dated 09/15/2012  FINDINGS: The uterus is atrophic. There is a 3.7 x 2.5 cm soft tissue mass in the right adnexum with adjacent calcifications, unchanged since 07/08/2011 and non FDG avid on PET scan. This is not felt to be significant.  Atrophic left ovary is normal.  Liver, biliary tree, spleen, pancreas, adrenal glands, and kidneys are normal. The previously demonstrated periaortic, pelvic, and inguinal adenopathy has completely resolved.  The bladder appears normal. There are some calcifications around the bladder base which have minimally increased since the prior study.  There is some fluid in the vagina and there is enhancement of the wall of the vagina, nonspecific.  The patient has compression fractures at T12, L2, L3, and L4, new since 07/08/2011.  IMPRESSION: 1. Increased enhancement of the vagina with a small amount of fluid in the vagina. This is nonspecific. 2. Stable right adnexal mass, not felt to be significant. Atrophic uterus and left ovary. 3. Resolution  of adenopathy. 4. Multiple compression fractures in the spine.   Electronically Signed   By: Rozetta Nunnery M.D.   On: 11/13/2013 20:47   Nm Pet Image Initial (pi) Whole Body  12/03/2013   CLINICAL DATA:  Initial treatment strategy for melanoma of the vagina. Additional history of lymphoma.  EXAM: NUCLEAR MEDICINE PET WHOLE BODY  TECHNIQUE: 6.0 mCi F-18 FDG was injected intravenously. CT data was obtained and used for attenuation correction and anatomic localization only. (This was not acquired as a diagnostic CT examination.) Additional exam technical data entered on technologist worksheet.  FASTING BLOOD GLUCOSE:  Value: 89 mg/dl  COMPARISON:  CT of the abdomen and pelvis 11/13/2013. PET-CT 09/15/2012.  FINDINGS: Head/Neck: No hypermetabolic lymph nodes in the neck.  Chest: No hypermetabolic mediastinal or hilar nodes. No suspicious pulmonary nodules on the CT scan. Right internal jugular single-lumen porta cath with tip terminating in right atrium There is atherosclerosis of the thoracic aorta, the great vessels of the mediastinum and the coronary arteries, including calcified atherosclerotic plaque in the left anterior descending and left circumflex and right coronary  arteries. Small amount of scarring in the medial segment of the right middle lobe. No consolidative airspace disease or pleural effusions.  Abdomen/Pelvis: Distortion of soft tissue planes in the region of the inferior aspect of the vagina were there is diffuse hypermetabolism associated with the lower vaginal mucosa (SUVmax = 9.1) , and some partially calcified hypermetabolic soft tissue which extends anteriorly to partially encase the urethra, compatible with the reported melanoma of the vagina. No definite surrounding pelvic lymphadenopathy.  No abnormal hypermetabolic activity within the liver, pancreas, adrenal glands, or spleen. No hypermetabolic lymph nodes in the abdomen. No significant volume of ascites. No pneumoperitoneum. No pathologic distention of small bowel. Atherosclerosis throughout the abdominal and pelvic vasculature, without evidence of aneurysm  Skeleton: There are multiple foci of hypermetabolism within the visualized axial and appendicular skeleton. Specific examples include the following. Lateral aspect of the right fourth rib (SUVmax = 2.8). Lateral aspect of the left fifth rib (SUVmax = 2.0). T9 and right transverse process where there is focal sclerosis (SUVmax = 3.9). Anterior superior endplate of L5 (SUVmax = 3.9). Left iliac crest anteriorly (image 157 of series 4) where there is a subtle lucent lesion (SUVmax = 5.8). And the superior aspect of the right iliac crest just lateral to the sacroiliac joint where there is a subtle area of sclerosis on image 152 of series 4 (SUVmax = 3.8). Multiple old vertebral body compression fractures are noted involving T12, L2, L3-L4, most severe at T12 where there is a vertebra plana appearance. These fractures are similar to recent prior examination 11/13/2013.  Extremeties: No hypermetabolic activity to suggest metastasis.  IMPRESSION: 1. Ill-defined hypermetabolic mass in the lower aspect of the vagina with apparent anterior extension partially  encasing the urethra corresponding to biopsy-proven malignant melanoma. 2. Multiple foci of skeletal hypermetabolism concerning for metastatic disease, as above. 3. Multiple old vertebral body compression fractures, most severe at T12 where there is vertebra plana. 4.  Atherosclerosis, including three-vessel coronary artery disease. 5. Additional incidental findings, as above.   Electronically Signed   By: Vinnie Langton M.D.   On: 12/03/2013 15:44      IMPRESSION: This is a very nice 79 year old woman with innumerable subcentimeter brain metastases from metastatic melanoma. She would benefit from whole brain radiotherapy. Immunotherapy does have the potential to cross the blood-brain barrier and positively influence brain metastases. However, in her case, she  requires dexamethasone for cerebral edema which will suppress the efficacy of immunotherapy, requiring whole brain radiotherapy initially in order to potentially taper off of dexamethasone and optimize the efficacy of immunotherapy.  PLAN:Today, I talked to the patient and family about the findings and work-up thus far.  We discussed the natural history of brain metastases and general treatment, highlighting the role or radiotherapy in the management.  We discussed the available radiation techniques, and focused on the details of logistics and delivery.  We reviewed the anticipated acute and late sequelae associated with radiation in this setting.  The patient was encouraged to ask questions that I answered to the best of my ability.  I filled out a patient counseling form during our discussion including treatment diagrams.  We retained a copy for our records.  The patient would like to proceed with radiation and will be scheduled for CT simulation.  I spent 60 minutes minutes face to face with the patient and more than 50% of that time was spent in counseling and/or coordination of care.      ------------------------------------------------  Sheral Apley. Tammi Klippel, M.D.

## 2013-12-13 NOTE — Progress Notes (Signed)
  Radiation Oncology         (336) [phone] ________________________________  Name: Colleen Cooke MRN: [id number]  Date: 12/13/2013  DOB: [birth year]  Simulation Verification Note  Status: outpatient  NARRATIVE: The patient was brought to the treatment unit and placed in the planned treatment position. The clinical setup was verified. Then port films were obtained and uploaded to the radiation oncology medical record software.  The treatment beams were carefully compared against the planned radiation fields. The position location and shape of the radiation fields was reviewed. They targeted volume of tissue appears to be appropriately covered by the radiation beams. Organs at risk appear to be excluded as planned.  Based on my personal review, I approved the simulation verification. The patient's treatment will proceed as planned.  ------------------------------------------------  Sheral Apley Tammi Klippel, M.D.

## 2013-12-13 NOTE — Progress Notes (Signed)
  Radiation Oncology         (336) [phone] ________________________________  Name: Colleen Cooke MRN: [id number]  Date: 12/13/2013  DOB: [birth year]  SIMULATION AND TREATMENT PLANNING NOTE  DIAGNOSIS:  79 year old woman with innumerable subcentimeter brain metastases  NARRATIVE:  The patient was brought to the [facility].  Identity was confirmed.  All relevant records and images related to the planned course of therapy were reviewed.  The patient freely provided informed written consent to proceed with treatment after reviewing the details related to the planned course of therapy. The consent form was witnessed and verified by the simulation staff.  Then, the patient was set-up in a stable reproducible  supine position for radiation therapy.  CT images were obtained.  Surface markings were placed.  The CT images were loaded into the planning software.  Then the target and avoidance structures were contoured.  Treatment planning then occurred.  The radiation prescription was entered and confirmed.  Then, I designed and supervised the construction of a total of 3 medically necessary complex treatment devices including a thermoplastic facemask for proper treatment immobilization and 2 multileaf collimator aperture as to shape radiation around the entire intracranial contents while excluding the eyes and face from the right and left lateral position..  I have requested : Isodose Plan.    PLAN:  The patient will receive 30 Gy in 10 fraction.  ________________________________  Sheral Apley Tammi Klippel, M.D.

## 2013-12-14 ENCOUNTER — Ambulatory Visit
Admission: RE | Admit: 2013-12-14 | Discharge: 2013-12-14 | Disposition: A | Discharge status: Home / Self Care | Payer: Medicare HMO | Source: Ambulatory Visit | Attending: Radiation Oncology | Admitting: Radiation Oncology

## 2013-12-14 DIAGNOSIS — Z510 Encounter for antineoplastic radiation therapy: Secondary | ICD-10-CM | POA: Diagnosis not present

## 2013-12-17 ENCOUNTER — Ambulatory Visit
Admission: RE | Admit: 2013-12-17 | Discharge: 2013-12-17 | Disposition: A | Discharge status: Home / Self Care | Payer: Medicare HMO | Source: Ambulatory Visit | Attending: Radiation Oncology | Admitting: Radiation Oncology

## 2013-12-17 ENCOUNTER — Telehealth: Payer: Self-pay | Admitting: Hematology and Oncology

## 2013-12-17 DIAGNOSIS — Z510 Encounter for antineoplastic radiation therapy: Secondary | ICD-10-CM | POA: Diagnosis not present

## 2013-12-17 NOTE — Telephone Encounter (Signed)
Faxed pt medical records to  Dr. Julious Payer office in [place].

## 2013-12-18 ENCOUNTER — Ambulatory Visit
Admission: RE | Admit: 2013-12-18 | Discharge: 2013-12-18 | Disposition: A | Discharge status: Home / Self Care | Payer: Medicare HMO | Source: Ambulatory Visit | Attending: Radiation Oncology | Admitting: Radiation Oncology

## 2013-12-18 ENCOUNTER — Telehealth: Payer: Self-pay | Admitting: Radiation Oncology

## 2013-12-18 ENCOUNTER — Other Ambulatory Visit: Payer: Self-pay | Admitting: Radiation Oncology

## 2013-12-18 ENCOUNTER — Other Ambulatory Visit: Payer: Medicare HMO

## 2013-12-18 DIAGNOSIS — Z510 Encounter for antineoplastic radiation therapy: Secondary | ICD-10-CM | POA: Diagnosis not present

## 2013-12-18 DIAGNOSIS — C719 Malignant neoplasm of brain, unspecified: Secondary | ICD-10-CM

## 2013-12-18 NOTE — Telephone Encounter (Signed)
Please refer home health to patient

## 2013-12-18 NOTE — Telephone Encounter (Signed)
Received call from therapist. She reports she phoned the patient's home and spoke with the patient's husband about schedule for radiation treatment. She reports the husband expressed need for assistance within the home. The therapist states, "the husband seems confused but said it is becoming difficult to care for the patient because she is deteriorating and confused." Phoned patient's daughter, Colleen Cooke, a nurse. She explained her father lost her mother to cancer but, family was close to help with management. She explains that everyone lives up [place] now but, plan to move the patient and her husband once radiation treatments are complete. She explains that her father has expressed he "isn't sure she is even taking her medication." Colleen Cooke reports she spoke to the patient over the weekend and noted a huge decline in her status and orientation. Colleen Cooke reports the patient told her over the weekend her nausea is better and she is eating more. Colleen Cooke agreed home health or assistance was needed in the home until radiation is complete and the patient can be moved. This Probation officer explained she will reach out to the patient's physician time for a home health order and initiate the process once order was obtained. She verbalized understanding and expressed appreciation for the call.

## 2013-12-19 ENCOUNTER — Ambulatory Visit: Payer: Medicare HMO

## 2013-12-19 ENCOUNTER — Telehealth: Payer: Self-pay | Admitting: Family Medicine

## 2013-12-19 NOTE — Telephone Encounter (Signed)
Diane was given patient medication list.

## 2013-12-19 NOTE — Telephone Encounter (Signed)
[facility] is needing to know the list of meds that the pt should be taking. Requesting verbal confirmation, need to verify the list .

## 2013-12-20 ENCOUNTER — Encounter: Payer: Self-pay | Admitting: Radiation Oncology

## 2013-12-20 ENCOUNTER — Telehealth: Payer: Self-pay | Admitting: Oncology

## 2013-12-20 ENCOUNTER — Ambulatory Visit
Admission: RE | Admit: 2013-12-20 | Discharge: 2013-12-20 | Disposition: A | Discharge status: Home / Self Care | Payer: Medicare HMO | Source: Ambulatory Visit | Attending: Radiation Oncology | Admitting: Radiation Oncology

## 2013-12-20 VITALS — BP 126/45 | HR 72 | Temp 97.8°F | Resp 16 | Ht 60.0 in | Wt 111.3 lb

## 2013-12-20 DIAGNOSIS — C7931 Secondary malignant neoplasm of brain: Secondary | ICD-10-CM

## 2013-12-20 DIAGNOSIS — Z510 Encounter for antineoplastic radiation therapy: Secondary | ICD-10-CM | POA: Diagnosis not present

## 2013-12-20 MED ORDER — BIAFINE EX EMUL: CUTANEOUS | Status: DC | PRN

## 2013-12-20 MED ORDER — BIAFINE EX EMUL
Freq: Once | CUTANEOUS | Status: AC
  Administered 2013-12-20: 16:00:00 via TOPICAL

## 2013-12-20 NOTE — Telephone Encounter (Signed)
[place] to review the treatment plan for [name].  They have a nurse scheduled to see her tomorrow.  The nurse will be calling them in the morning with the time.  They also have a OT/PT eval scheduled for 6/29 and nursing visits scheduled for 6/30, 7/3, 7/8, 7/29 and 8/19.  Called [name]'s husband [name] and let him know the dates.

## 2013-12-20 NOTE — Progress Notes (Signed)
  Radiation Oncology         ([phone]) [phone] ________________________________  Name: Colleen Cooke  MRN: [id number]  Date: 12/20/2013  DOB: 1935/06/04  Weekly Radiation Therapy Management  Current Dose: 15 Gy     Planned Dose:  30 Gy  Narrative . . . . . . . . The patient presents for routine under treatment assessment.                                   The patient is without complaint.                                 Set-up films were reviewed.                                 The chart was checked. Physical Findings. . .  height is 5' (1.524 m) and weight is 111 lb 4.8 oz (50.485 kg). Her oral temperature is 97.8 F (36.6 C). Her blood pressure is 126/45 and her pulse is 72. Her respiration is 16 and oxygen saturation is 92%. . Weight essentially stable.  No significant changes. Impression . . . . . . . The patient is tolerating radiation. Plan . . . . . . . . . . . . Continue treatment as planned.  ________________________________  Sheral Apley. Tammi Klippel, M.D.

## 2013-12-20 NOTE — Progress Notes (Signed)
Colleen Cooke has completed 5/10 fractions to her whole brain.  Patient is alert and oriented to person, place and time.   She reports taking decadron 4 mg once a day.  She denies pain, vision changes, headache and nausea.  She does report feeling dizzy and having problems with her balance.  She is using a 4 pronged cane today.  She reports fatigue and a poor appetite.  Her weight is 1 lb from 12/13/13.    She was given the Radiation Therapy and You book and discussed side effects/management of skin changes, hair loss and fatigue.  She was given biafine cream and was instructed to apply if for skin changes to her forehead/scalp twice a day, after treatment and at bedtime.

## 2013-12-21 ENCOUNTER — Telehealth: Payer: Self-pay | Admitting: Hematology and Oncology

## 2013-12-21 ENCOUNTER — Ambulatory Visit: Payer: Medicare HMO | Admitting: Radiation Oncology

## 2013-12-21 ENCOUNTER — Telehealth: Payer: Self-pay | Admitting: *Deleted

## 2013-12-21 ENCOUNTER — Other Ambulatory Visit: Payer: Self-pay | Admitting: Family Medicine

## 2013-12-21 ENCOUNTER — Ambulatory Visit
Admission: RE | Admit: 2013-12-21 | Discharge: 2013-12-21 | Disposition: A | Discharge status: Home / Self Care | Payer: Medicare HMO | Source: Ambulatory Visit | Attending: Radiation Oncology | Admitting: Radiation Oncology

## 2013-12-21 DIAGNOSIS — Z510 Encounter for antineoplastic radiation therapy: Secondary | ICD-10-CM | POA: Diagnosis not present

## 2013-12-21 NOTE — Telephone Encounter (Signed)
Pt appt. To see Dr. Jeannie Fend in [place] is 01/04/14@9 :00. Pt is aware

## 2013-12-21 NOTE — Telephone Encounter (Signed)
Spoke with husband [name] and informed him re:  Per Dr. Alvy Bimler, pt can decrease Dexamethasone to Twice daily -  Take 1 tablet in am with breakfast , and 1 tablet with lunch.  [name] voiced understanding.

## 2013-12-21 NOTE — Telephone Encounter (Signed)
Received call from Aaron Edelman, RN @ [facility] re: Chong Sicilian was seeing pt today for the first time.  Pt requested to have home health assistant to assist with some ADLs since pt is weak.  Dr. Alvy Bimler notified.  Gave Patty verbal order that it is ok for pt to have HHA as ok per md. Patty called back and stated husband reported that pt is " foggy ".  Per Patty, pt's VSs were WNL; pt is walking fine, but pleasantly confused started when pt began radiation.  Pt's daughter - who is a Marine scientist in [place] - informed pt's husband that could be side effects of pt taking high dose of Dexamethasone.  Husband wanted to know what Dr. Alvy Bimler would recommend. [facility]  Phone   [phone].

## 2013-12-24 ENCOUNTER — Other Ambulatory Visit: Payer: Self-pay | Admitting: Hematology and Oncology

## 2013-12-24 ENCOUNTER — Ambulatory Visit
Admission: RE | Admit: 2013-12-24 | Discharge: 2013-12-24 | Disposition: A | Discharge status: Home / Self Care | Payer: Medicare HMO | Source: Ambulatory Visit | Attending: Radiation Oncology | Admitting: Radiation Oncology

## 2013-12-24 DIAGNOSIS — C52 Malignant neoplasm of vagina: Secondary | ICD-10-CM

## 2013-12-24 DIAGNOSIS — Z510 Encounter for antineoplastic radiation therapy: Secondary | ICD-10-CM | POA: Diagnosis not present

## 2013-12-25 ENCOUNTER — Telehealth: Payer: Self-pay | Admitting: *Deleted

## 2013-12-25 ENCOUNTER — Encounter: Payer: Self-pay | Admitting: Hematology and Oncology

## 2013-12-25 ENCOUNTER — Telehealth: Payer: Self-pay | Admitting: Hematology and Oncology

## 2013-12-25 ENCOUNTER — Ambulatory Visit (HOSPITAL_BASED_OUTPATIENT_CLINIC_OR_DEPARTMENT_OTHER): Payer: Commercial Managed Care - HMO | Admitting: Hematology and Oncology

## 2013-12-25 ENCOUNTER — Ambulatory Visit (HOSPITAL_BASED_OUTPATIENT_CLINIC_OR_DEPARTMENT_OTHER): Payer: Commercial Managed Care - HMO

## 2013-12-25 ENCOUNTER — Encounter: Payer: Self-pay | Admitting: *Deleted

## 2013-12-25 ENCOUNTER — Other Ambulatory Visit (HOSPITAL_BASED_OUTPATIENT_CLINIC_OR_DEPARTMENT_OTHER): Payer: Commercial Managed Care - HMO

## 2013-12-25 ENCOUNTER — Ambulatory Visit
Admission: RE | Admit: 2013-12-25 | Discharge: 2013-12-25 | Disposition: A | Discharge status: Home / Self Care | Payer: Medicare HMO | Source: Ambulatory Visit | Attending: Radiation Oncology | Admitting: Radiation Oncology

## 2013-12-25 ENCOUNTER — Ambulatory Visit: Payer: Commercial Managed Care - HMO

## 2013-12-25 VITALS — BP 89/61 | HR 81 | Temp 97.3°F | Resp 18 | Ht 60.0 in | Wt 106.7 lb

## 2013-12-25 DIAGNOSIS — C52 Malignant neoplasm of vagina: Secondary | ICD-10-CM

## 2013-12-25 DIAGNOSIS — Z5112 Encounter for antineoplastic immunotherapy: Secondary | ICD-10-CM

## 2013-12-25 DIAGNOSIS — C7949 Secondary malignant neoplasm of other parts of nervous system: Secondary | ICD-10-CM

## 2013-12-25 DIAGNOSIS — C7931 Secondary malignant neoplasm of brain: Secondary | ICD-10-CM

## 2013-12-25 DIAGNOSIS — D696 Thrombocytopenia, unspecified: Secondary | ICD-10-CM

## 2013-12-25 DIAGNOSIS — Z510 Encounter for antineoplastic radiation therapy: Secondary | ICD-10-CM | POA: Diagnosis not present

## 2013-12-25 DIAGNOSIS — C8330 Diffuse large B-cell lymphoma, unspecified site: Secondary | ICD-10-CM

## 2013-12-25 DIAGNOSIS — Z95828 Presence of other vascular implants and grafts: Secondary | ICD-10-CM

## 2013-12-25 LAB — CBC WITH DIFFERENTIAL/PLATELET
BASO%: 0.3 % (ref 0.0–2.0)
Basophils Absolute: 0 10*3/uL (ref 0.0–0.1)
EOS%: 1.3 % (ref 0.0–7.0)
Eosinophils Absolute: 0.1 10*3/uL (ref 0.0–0.5)
HCT: 40.4 % (ref 34.8–46.6)
HGB: 13.5 g/dL (ref 11.6–15.9)
LYMPH%: 3.3 % — ABNORMAL LOW (ref 14.0–49.7)
MCH: 32.4 pg (ref 25.1–34.0)
MCHC: 33.4 g/dL (ref 31.5–36.0)
MCV: 97.1 fL (ref 79.5–101.0)
MONO#: 0.7 10*3/uL (ref 0.1–0.9)
MONO%: 8.3 % (ref 0.0–14.0)
NEUT#: 7.4 10*3/uL — ABNORMAL HIGH (ref 1.5–6.5)
NEUT%: 86.8 % — ABNORMAL HIGH (ref 38.4–76.8)
PLATELETS: 121 10*3/uL — AB (ref 145–400)
RBC: 4.16 10*6/uL (ref 3.70–5.45)
RDW: 13.3 % (ref 11.2–14.5)
WBC: 8.6 10*3/uL (ref 3.9–10.3)
lymph#: 0.3 10*3/uL — ABNORMAL LOW (ref 0.9–3.3)

## 2013-12-25 LAB — COMPREHENSIVE METABOLIC PANEL (CC13)
ALT: 16 U/L (ref 0–55)
AST: 22 U/L (ref 5–34)
Albumin: 3.6 g/dL (ref 3.5–5.0)
Alkaline Phosphatase: 64 U/L (ref 40–150)
Anion Gap: 9 mEq/L (ref 3–11)
BILIRUBIN TOTAL: 0.64 mg/dL (ref 0.20–1.20)
BUN: 25.6 mg/dL (ref 7.0–26.0)
CO2: 36 mEq/L — ABNORMAL HIGH (ref 22–29)
Calcium: 10.2 mg/dL (ref 8.4–10.4)
Chloride: 94 mEq/L — ABNORMAL LOW (ref 98–109)
Creatinine: 1 mg/dL (ref 0.6–1.1)
Glucose: 92 mg/dl (ref 70–140)
Potassium: 3.8 mEq/L (ref 3.5–5.1)
Sodium: 138 mEq/L (ref 136–145)
TOTAL PROTEIN: 5.6 g/dL — AB (ref 6.4–8.3)

## 2013-12-25 LAB — LACTATE DEHYDROGENASE (CC13): LDH: 584 U/L — AB (ref 125–245)

## 2013-12-25 MED ORDER — SODIUM CHLORIDE 0.9 % IV SOLN
Freq: Once | INTRAVENOUS | Status: AC
  Administered 2013-12-25: 10:00:00 via INTRAVENOUS

## 2013-12-25 MED ORDER — HEPARIN SOD (PORK) LOCK FLUSH 100 UNIT/ML IV SOLN
500.0000 [IU] | Freq: Once | INTRAVENOUS | Status: AC | PRN
  Administered 2013-12-25: 500 [IU]
  Filled 2013-12-25: qty 5

## 2013-12-25 MED ORDER — SODIUM CHLORIDE 0.9 % IV SOLN
3.0000 mg/kg | Freq: Once | INTRAVENOUS | Status: AC
  Administered 2013-12-25: 140 mg via INTRAVENOUS
  Filled 2013-12-25: qty 14

## 2013-12-25 MED ORDER — SODIUM CHLORIDE 0.9 % IJ SOLN
10.0000 mL | INTRAMUSCULAR | Status: DC | PRN
  Administered 2013-12-25: 10 mL
  Filled 2013-12-25: qty 10

## 2013-12-25 MED ORDER — SODIUM CHLORIDE 0.9 % IJ SOLN
10.0000 mL | INTRAMUSCULAR | Status: DC | PRN
  Administered 2013-12-25: 10 mL via INTRAVENOUS
  Filled 2013-12-25: qty 10

## 2013-12-25 NOTE — Assessment & Plan Note (Signed)
She will continue taking dexamethasone 4 mg twice a day for now. She complained of memory issues which is not uncommon for patients undergoing radiation therapy to the brain.

## 2013-12-25 NOTE — Assessment & Plan Note (Signed)
Likely related to treatment or disease infiltration of the bone marrow. She is not symptomatic. Observe only.

## 2013-12-25 NOTE — Patient Instructions (Addendum)
[facility] Discharge Instructions for Patients Receiving Chemotherapy  Today you received the following chemotherapy agents :  Nivolumab.  To help prevent nausea and vomiting after your treatment, we encourage you to take your nausea medication as instructed by your physician.   If you develop nausea and vomiting that is not controlled by your nausea medication, call the clinic.   BELOW ARE SYMPTOMS THAT SHOULD BE REPORTED IMMEDIATELY:  *FEVER GREATER THAN 100.5 F  *CHILLS WITH OR WITHOUT FEVER  NAUSEA AND VOMITING THAT IS NOT CONTROLLED WITH YOUR NAUSEA MEDICATION  *UNUSUAL SHORTNESS OF BREATH  *UNUSUAL BRUISING OR BLEEDING  TENDERNESS IN MOUTH AND THROAT WITH OR WITHOUT PRESENCE OF ULCERS  *URINARY PROBLEMS  *BOWEL PROBLEMS  UNUSUAL RASH Items with * indicate a potential emergency and should be followed up as soon as possible.  Feel free to call the clinic should you have any questions or concerns. The clinic phone number is [phone].

## 2013-12-25 NOTE — Assessment & Plan Note (Signed)
She has ongoing radiation treatment for palliative reasons to the brain. I will continue on treatment today with Nivolumab. She has no further vaginal bleeding. The patient will move out-of-state next week. I will call her oncologist in [place] to assume care.

## 2013-12-25 NOTE — Telephone Encounter (Signed)
Attempted to call pt's husband for pick up after chemo. Busy signal. Will continue to try.

## 2013-12-25 NOTE — Progress Notes (Signed)
Gave pt copy of office note from today to add to her records.  Also wrote instructions for her husband to pick up PET and MRI on CD at [facility] Radiology [facility].  Ready to pick up to hand carry to new Oncologist in [place].  Pt verbalized understanding.  Instructed her to have husband call nurse if any questions.

## 2013-12-25 NOTE — Telephone Encounter (Signed)
I spoke with the patient's daughter to inform her of the progress. I also spoke with Dr. Jeannie Fend from [place] who will be taking care of the patient after she moved out of state.

## 2013-12-25 NOTE — Progress Notes (Signed)
[facility] OFFICE PROGRESS NOTE  Patient Care Team: Bruce W Burchette, MD as PCP - General Ni Gorsuch, MD as Consulting Physician (Hematology and Oncology)  SUMMARY OF ONCOLOGIC HISTORY: Oncology History   Lymphoma, follicular subtype   Primary site: Lymphoid Neoplasms (Bilateral)   Staging method: AJCC 6th Edition   Clinical: Stage IV (positiv pleural effusion)   Pathologic: Stage IV signed by Ni Gorsuch, MD on 05/16/2013  9:45 AM   Summary: Stage IV  Diffuse large B cell lymphoma, relapsed from transformation   Primary site: Lymphoid Neoplasms (Right)   Staging method: AJCC 6th Edition   Clinical: Stage II signed by Ni Gorsuch, MD on 05/16/2013 10:18 AM   Pathologic: Stage II signed by Ni Gorsuch, MD on 05/16/2013 10:18 AM   Summary: Stage II  Vaginal melanoma   Primary site: Melanoma of the Skin   Staging method: AJCC 7th Edition   Clinical: Stage IV (TX, N0, M1c) signed by Ni Gorsuch, MD on 12/04/2013 10:11 PM   Summary: Stage IV (TX, N0, M1c)         Diffuse large B cell lymphoma   06/09/2011 Procedure LN biopsy confirmed low grade follicular lymphoma   07/02/2011 Initial Diagnosis Lymphoma   07/06/2011 Bone Marrow Biopsy BM was negative   07/08/2011 Imaging Hypermetabolic lymphadenopathy in the neck, chest, abdomen, and pelvis,as well as pleural effusion   07/21/2011 - 09/17/2011 Chemotherapy Patient received 3 cycles of Bendamustine/Rituximab. Chemotherapy was stopped due to new palpable lump and compression fractures with bone pain   10/12/2011 Imaging PET/CT scan confirmed complete response   03/09/2012 Relapse/Recurrence Patient felt an enlarge LN in her neck   03/28/2012 Imaging Repeat PET/CT showed High-grade lymphoma recurrence with intensely hypermetabolic lymph nodes in the right neck.      04/04/2012 Procedure LN biopsy confirmed transformation to DLBCL   04/14/2012 Bone Marrow Biopsy BM biopsy was negative   04/24/2012 - 09/04/2012 Chemotherapy She  completed 5 cycles of mini-RCHOP   09/15/2012 Imaging Repeat PET/CT showed resolved adenopathy in the neck.  No current abnormal extraosseous hypermetabolic activity suspicious for tumor.    11/17/2012 - 09/05/2013 Chemotherapy Patient is started on maintenance Rituximab every other month    Vaginal melanoma   11/13/2013 Imaging CT scan of the pelvis showed abnormalities in the vagina area.   11/14/2013 Procedure Vaginal biopsy showed melanoma.   11/14/2013 Pathology BRAF mutation testing was negative.   12/03/2013 Imaging PET CT scan showed diffuse metastatic disease.   12/11/2013 -  Chemotherapy She was started on the nivolumab   12/12/2013 Imaging MRI of the brain showed diffuse intracranial metastasis.   12/14/2013 -  Radiation Therapy She is started on radiation therapy to the brain.    INTERVAL HISTORY: Please see below for problem oriented charting. She is seen prior to cycle 2 of treatment. She complained of memory issues and weakness. She denies further vaginal bleeding. Denies any headache or nausea.  REVIEW OF SYSTEMS:   Constitutional: Denies fevers, chills or abnormal weight loss Eyes: Denies blurriness of vision Ears, nose, mouth, throat, and face: Denies mucositis or sore throat Respiratory: Denies cough, dyspnea or wheezes Cardiovascular: Denies palpitation, chest discomfort or lower extremity swelling Gastrointestinal:  Denies nausea, heartburn or change in bowel habits Skin: Denies abnormal skin rashes Lymphatics: Denies new lymphadenopathy or easy bruising Neurological:Denies numbness, tingling or new weaknesses Behavioral/Psych: Mood is stable, no new changes  All other systems were reviewed with the patient and are negative.  I have reviewed the   past medical history, past surgical history, social history and family history with the patient and they are unchanged from previous note.  ALLERGIES:  is allergic to penicillins.  MEDICATIONS:  Current Outpatient  Prescriptions  Medication Sig Dispense Refill  . alendronate (FOSAMAX) 70 MG tablet Take 70 mg by mouth once a week. Take with a full glass of water on an empty stomach.      Marland Kitchen ascorbic acid (VITAMIN C) 500 MG tablet Take 500 mg by mouth daily.      . Calcium Carb-Cholecalciferol (CALCIUM 600 + D PO) Take 1 tablet by mouth daily.      . cholecalciferol (VITAMIN D) 1000 UNITS tablet Take 1,000 Units by mouth daily.      Marland Kitchen dexamethasone (DECADRON) 4 MG tablet Take 4 mg by mouth 2 (two) times daily.      Marland Kitchen ibuprofen (ADVIL,MOTRIN) 200 MG tablet Take 200 mg by mouth every 6 (six) hours as needed for mild pain.      Marland Kitchen lidocaine-prilocaine (EMLA) cream Apply topically as needed. Put it on portacath one hour before access.  30 g  2  . Magnesium 250 MG TABS Take 1 tablet by mouth daily as needed (constipation).      . mirtazapine (REMERON) 15 MG tablet TAKE 1 TABLET AT BEDTIME  90 tablet  0  . ondansetron (ZOFRAN) 8 MG tablet Take 1 tablet (8 mg total) by mouth 2 (two) times daily as needed for nausea or vomiting.  30 tablet  1  . Potassium Gluconate 595 MG CAPS Take 1 capsule by mouth daily.      . promethazine (PHENERGAN) 12.5 MG tablet Take 1 tablet (12.5 mg total) by mouth every 6 (six) hours as needed for nausea.  30 tablet  3  . vitamin E 400 UNIT capsule Take 400 Units by mouth daily.       No current facility-administered medications for this visit.   Facility-Administered Medications Ordered in Other Visits  Medication Dose Route Frequency Provider Last Rate Last Dose  . sodium chloride 0.9 % injection 10 mL  10 mL Intravenous PRN Heath Lark, MD   10 mL at 11/07/13 0943    PHYSICAL EXAMINATION: ECOG PERFORMANCE STATUS: 2 - Symptomatic, <50% confined to bed  Filed Vitals:   12/25/13 0904  BP: 89/61  Pulse: 81  Temp: 97.3 F (36.3 C)  Resp: 18   Filed Weights   12/25/13 0904  Weight: 106 lb 11.2 oz (48.399 kg)    GENERAL:alert, no distress and comfortable. She looks thin and  mildly cachectic SKIN: skin color, texture, turgor are normal, no rashes or significant lesions EYES: normal, Conjunctiva are pink and non-injected, sclera clear OROPHARYNX:no exudate, no erythema and lips, buccal mucosa, and tongue normal  NECK: supple, thyroid normal size, non-tender, without nodularity LYMPH:  no palpable lymphadenopathy in the cervical, axillary or inguinal LUNGS: clear to auscultation and percussion with normal breathing effort HEART: regular rate & rhythm and no murmurs and no lower extremity edema ABDOMEN:abdomen soft, non-tender and normal bowel sounds Musculoskeletal:no cyanosis of digits and no clubbing  NEURO: alert & oriented x 3 with fluent speech, no focal motor/sensory deficits  LABORATORY DATA:  I have reviewed the data as listed    Component Value Date/Time   NA 138 12/25/2013 0846   NA 136* 11/13/2013 1840   K 3.8 12/25/2013 0846   K 4.4 11/13/2013 1840   CL 97 11/13/2013 1840   CL 102 11/17/2012 0820   CO2 36*  12/25/2013 0846   CO2 27 11/13/2013 1840   GLUCOSE 92 12/25/2013 0846   GLUCOSE 107* 11/13/2013 1840   GLUCOSE 98 11/17/2012 0820   BUN 25.6 12/25/2013 0846   BUN 11 11/13/2013 1840   CREATININE 1.0 12/25/2013 0846   CREATININE 0.81 11/13/2013 1840   CALCIUM 10.2 12/25/2013 0846   CALCIUM 10.1 11/13/2013 1840   PROT 5.6* 12/25/2013 0846   PROT 5.0* 07/04/2012 0600   ALBUMIN 3.6 12/25/2013 0846   ALBUMIN 2.7* 07/04/2012 0600   AST 22 12/25/2013 0846   AST 9 07/04/2012 0600   ALT 16 12/25/2013 0846   ALT 17 07/04/2012 0600   ALKPHOS 64 12/25/2013 0846   ALKPHOS 102 07/04/2012 0600   BILITOT 0.64 12/25/2013 0846   BILITOT 0.7 07/04/2012 0600   GFRNONAA 69* 11/13/2013 1840   GFRAA 80* 11/13/2013 1840    No results found for this basename: SPEP, UPEP,  kappa and lambda light chains    Lab Results  Component Value Date   WBC 8.6 12/25/2013   NEUTROABS 7.4* 12/25/2013   HGB 13.5 12/25/2013   HCT 40.4 12/25/2013   MCV 97.1 12/25/2013   PLT 121* 12/25/2013       Chemistry      Component Value Date/Time   NA 138 12/25/2013 0846   NA 136* 11/13/2013 1840   K 3.8 12/25/2013 0846   K 4.4 11/13/2013 1840   CL 97 11/13/2013 1840   CL 102 11/17/2012 0820   CO2 36* 12/25/2013 0846   CO2 27 11/13/2013 1840   BUN 25.6 12/25/2013 0846   BUN 11 11/13/2013 1840   CREATININE 1.0 12/25/2013 0846   CREATININE 0.81 11/13/2013 1840      Component Value Date/Time   CALCIUM 10.2 12/25/2013 0846   CALCIUM 10.1 11/13/2013 1840   ALKPHOS 64 12/25/2013 0846   ALKPHOS 102 07/04/2012 0600   AST 22 12/25/2013 0846   AST 9 07/04/2012 0600   ALT 16 12/25/2013 0846   ALT 17 07/04/2012 0600   BILITOT 0.64 12/25/2013 0846   BILITOT 0.7 07/04/2012 0600      ASSESSMENT & PLAN:  Vaginal melanoma She has ongoing radiation treatment for palliative reasons to the brain. I will continue on treatment today with Nivolumab. She has no further vaginal bleeding. The patient will move out-of-state next week. I will call her oncologist in [place] to assume care.  Metastasis to brain She will continue taking dexamethasone 4 mg twice a day for now. She complained of memory issues which is not uncommon for patients undergoing radiation therapy to the brain.  Thrombocytopenia Likely related to treatment or disease infiltration of the bone marrow. She is not symptomatic. Observe only.   All questions were answered. The patient knows to call the clinic with any problems, questions or concerns. No barriers to learning was detected. I spent 40 minutes counseling the patient face to face. The total time spent in the appointment was 55 minutes and more than 50% was on counseling and review of test results     GORSUCH, NI, MD 12/25/2013 9:51 AM    

## 2013-12-26 ENCOUNTER — Ambulatory Visit
Admission: RE | Admit: 2013-12-26 | Discharge: 2013-12-26 | Disposition: A | Discharge status: Home / Self Care | Payer: Medicare HMO | Source: Ambulatory Visit | Attending: Radiation Oncology | Admitting: Radiation Oncology

## 2013-12-26 ENCOUNTER — Ambulatory Visit: Payer: Medicare HMO

## 2013-12-26 DIAGNOSIS — Z510 Encounter for antineoplastic radiation therapy: Secondary | ICD-10-CM | POA: Diagnosis not present

## 2013-12-27 ENCOUNTER — Encounter: Payer: Self-pay | Admitting: Radiation Oncology

## 2013-12-27 ENCOUNTER — Ambulatory Visit: Payer: Medicare HMO

## 2013-12-27 ENCOUNTER — Ambulatory Visit
Admission: RE | Admit: 2013-12-27 | Discharge: 2013-12-27 | Disposition: A | Discharge status: Home / Self Care | Payer: Medicare HMO | Source: Ambulatory Visit | Attending: Radiation Oncology | Admitting: Radiation Oncology

## 2013-12-27 VITALS — BP 122/48 | HR 69 | Temp 97.9°F | Resp 20 | Wt 107.3 lb

## 2013-12-27 DIAGNOSIS — Z510 Encounter for antineoplastic radiation therapy: Secondary | ICD-10-CM | POA: Diagnosis not present

## 2013-12-27 DIAGNOSIS — C8330 Diffuse large B-cell lymphoma, unspecified site: Secondary | ICD-10-CM

## 2013-12-27 DIAGNOSIS — C7931 Secondary malignant neoplasm of brain: Secondary | ICD-10-CM

## 2013-12-27 NOTE — Progress Notes (Signed)
Department of Radiation Oncology  Phone:  [phone] Fax:        [phone]  Weekly Treatment Note    Name: Colleen Cooke Date: 12/27/2013 MRN: [id number] DOB: 10/27/1934   Current dose: 30 Gy  Current fraction: 10   MEDICATIONS: Current Outpatient Prescriptions  Medication Sig Dispense Refill  . alendronate (FOSAMAX) 70 MG tablet Take 70 mg by mouth once a week. Take with a full glass of water on an empty stomach.      Marland Kitchen ascorbic acid (VITAMIN C) 500 MG tablet Take 500 mg by mouth daily.      . Calcium Carb-Cholecalciferol (CALCIUM 600 + D PO) Take 1 tablet by mouth daily.      . cholecalciferol (VITAMIN D) 1000 UNITS tablet Take 1,000 Units by mouth daily.      Marland Kitchen dexamethasone (DECADRON) 4 MG tablet Take 4 mg by mouth 2 (two) times daily.      Marland Kitchen ibuprofen (ADVIL,MOTRIN) 200 MG tablet Take 200 mg by mouth every 6 (six) hours as needed for mild pain.      Marland Kitchen lidocaine-prilocaine (EMLA) cream Apply topically as needed. Put it on portacath one hour before access.  30 g  2  . Magnesium 250 MG TABS Take 1 tablet by mouth daily as needed (constipation).      . mirtazapine (REMERON) 15 MG tablet TAKE 1 TABLET AT BEDTIME  90 tablet  0  . ondansetron (ZOFRAN) 8 MG tablet Take 1 tablet (8 mg total) by mouth 2 (two) times daily as needed for nausea or vomiting.  30 tablet  1  . Potassium Gluconate 595 MG CAPS Take 1 capsule by mouth daily.      . promethazine (PHENERGAN) 12.5 MG tablet Take 1 tablet (12.5 mg total) by mouth every 6 (six) hours as needed for nausea.  30 tablet  3  . vitamin E 400 UNIT capsule Take 400 Units by mouth daily.       No current facility-administered medications for this encounter.   Facility-Administered Medications Ordered in Other Encounters  Medication Dose Route Frequency Provider Last Rate Last Dose  . sodium chloride 0.9 % injection 10 mL  10 mL Intravenous PRN Heath Lark, MD   10 mL at 11/07/13 0943     ALLERGIES: Penicillins   LABORATORY  DATA:  Lab Results  Component Value Date   WBC 8.6 12/25/2013   HGB 13.5 12/25/2013   HCT 40.4 12/25/2013   MCV 97.1 12/25/2013   PLT 121* 12/25/2013   Lab Results  Component Value Date   NA 138 12/25/2013   K 3.8 12/25/2013   CL 97 11/13/2013   CO2 36* 12/25/2013   Lab Results  Component Value Date   ALT 16 12/25/2013   AST 22 12/25/2013   ALKPHOS 64 12/25/2013   BILITOT 0.64 12/25/2013     NARRATIVE: Colleen Cooke was seen today for weekly treatment management. The chart was checked and the patient's films were reviewed. The patient states that she has done fine over the last week of her treatment. She notes no headaches, no nausea. Overall she has been doing well. She is leaving for [place] next week.  PHYSICAL EXAMINATION: weight is 107 lb 4.8 oz (48.671 kg). Her oral temperature is 97.9 F (36.6 C). Her blood pressure is 122/48 and her pulse is 69. Her respiration is 20 and oxygen saturation is 99%.      no thrush present  ASSESSMENT: The patient  did satisfactorily with treatment.  PLAN: The patient is moving to [place] and will reestablish care there.    The patient will begin tapering her steroids. She will now begin Decadron 1 tablet per day into this for one week, then half a tablet for one week. No followup scheduled for her. The patient will followup with [place] on a when necessary basis.

## 2013-12-27 NOTE — Progress Notes (Signed)
Weekly rad txs completed 10/10, whole brain, no nausea, no pain, no blurred vision, no dizzyness, does have some weakness, yellow band on left wrist, in w/c, is moving to [facility]. This weekend n, will not be back for any follow ups, taking decadron 4mg  tablets bid, asked if she should continue and for how longno c/o pain , appetite fair,  No thrush seen on tongue, moist  3:51 PM'

## 2014-01-01 ENCOUNTER — Other Ambulatory Visit: Payer: Medicare HMO

## 2014-01-08 ENCOUNTER — Other Ambulatory Visit: Payer: Medicare HMO

## 2014-01-08 ENCOUNTER — Ambulatory Visit: Payer: Medicare HMO

## 2014-01-15 ENCOUNTER — Other Ambulatory Visit: Payer: Medicare HMO

## 2014-01-17 ENCOUNTER — Other Ambulatory Visit: Payer: Self-pay | Admitting: Hematology and Oncology

## 2014-01-26 DEATH — deceased

## 2014-01-29 ENCOUNTER — Other Ambulatory Visit: Payer: Medicare HMO

## 2014-02-19 ENCOUNTER — Encounter: Payer: Self-pay | Admitting: General Practice

## 2014-05-01 ENCOUNTER — Other Ambulatory Visit: Payer: Commercial Managed Care - HMO

## 2014-05-01 ENCOUNTER — Ambulatory Visit: Payer: Commercial Managed Care - HMO | Admitting: Hematology and Oncology

## 2014-05-22 NOTE — Progress Notes (Signed)
°  Radiation Oncology         [phone] ________________________________  Name: Colleen Cooke MRN: [id number]  Date: 12/27/2013  DOB: Jan 08, 1935  End of Treatment Note  Diagnosis:   79 yo woman with innumerable brain metastases from metastatic melanoma - stage IV  Indication for treatment:  Palliation       Radiation treatment dates:   12/13/13-12/27/13  Site/dose:   The whole brain was treated to 30 Gy in 10 fractions of 3 Gy  Beams/energy:   Right and Left radiation fields were treated using 6 MV X-rays with custom MLC collimation to shield the eyes and face.  The patient was immobilized with a thermoplastic mask and isocenter was verified with weekly port films.  Narrative: The patient tolerated radiation treatment relatively well.  Plan: The patient has completed radiation treatment. ________________________________  Sheral Apley. Tammi Klippel, M.D.

## 2016-04-08 IMAGING — CT CT ABD-PELV W/ CM
1 of 3 series · 14 of 32 positions shown, 19 images · IV contrast (omnipaque)
Comparison: CT scan dated 07/08/2011 and PET-CT scan dated
09/15/2012

CLINICAL DATA: Abnormal vaginal bleeding and abdominal distention.
History of lymphoma.

EXAM:
CT ABDOMEN AND PELVIS WITH CONTRAST
TECHNIQUE: Multidetector CT imaging of the abdomen and pelvis was performed
using the standard protocol following bolus administration of
intravenous contrast.
CONTRAST:  100mL OMNIPAQUE IOHEXOL 300 MG/ML  SOLN

[Series 2: abd/pel with · axial · 0.71mm/px · z∈[-260,+40]mm · 14 of 69 slices shown, 19 images]
[im 5/69  soft-tissue]
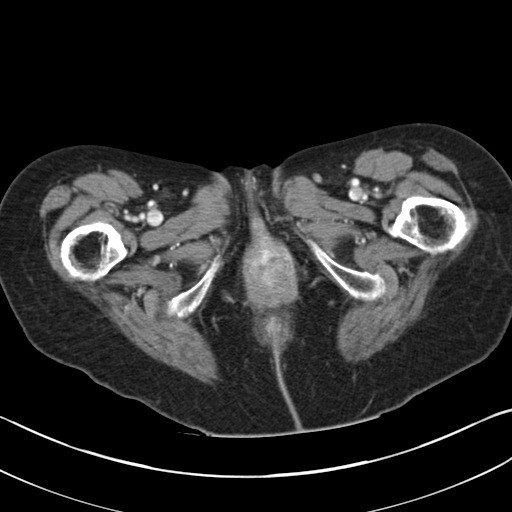
[im 5/69  bone]
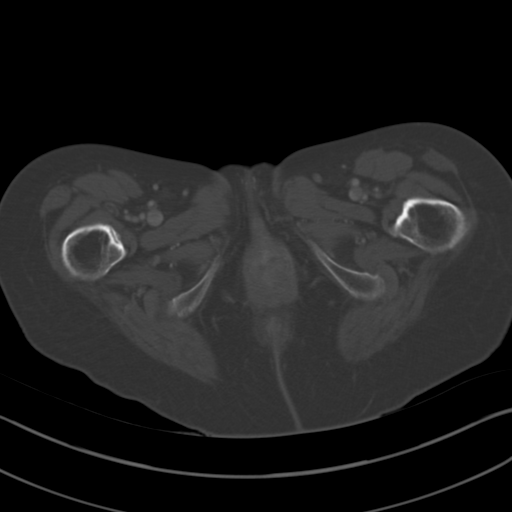
[im 9/69  soft-tissue]
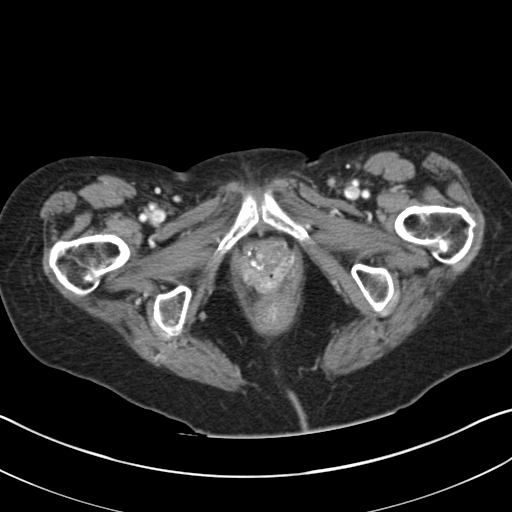
[im 17/69  soft-tissue]
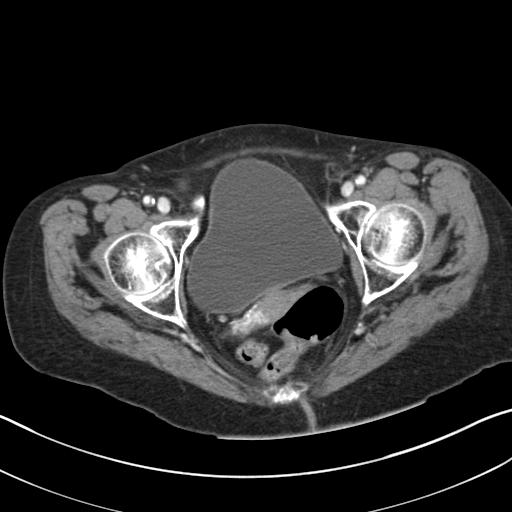
[im 21/69  soft-tissue]
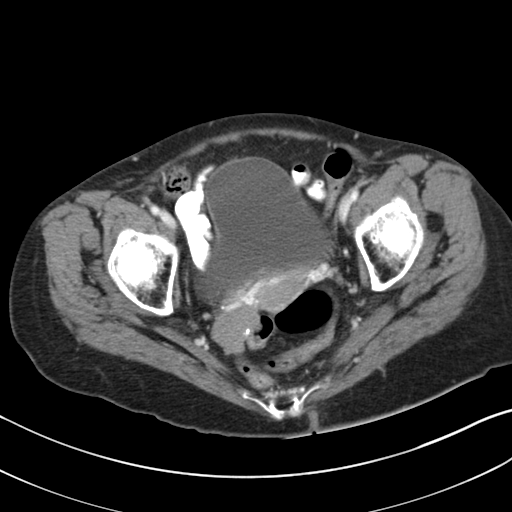
[im 25/69  soft-tissue]
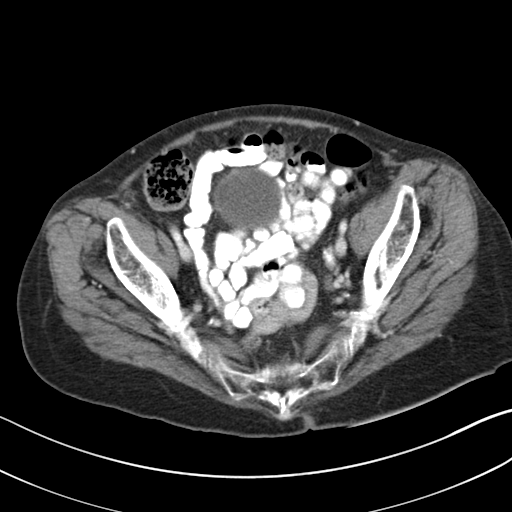
[im 29/69  soft-tissue]
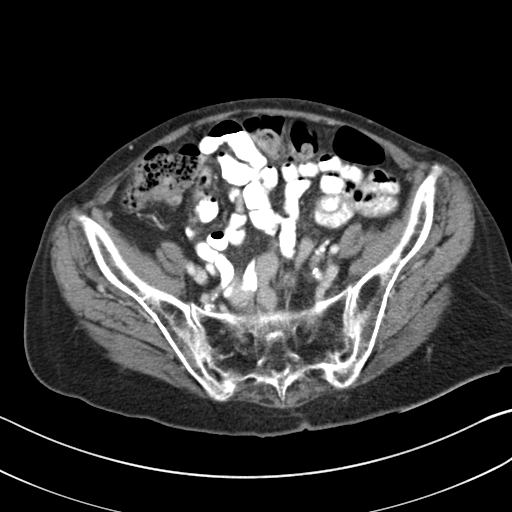
[im 37/69  soft-tissue]
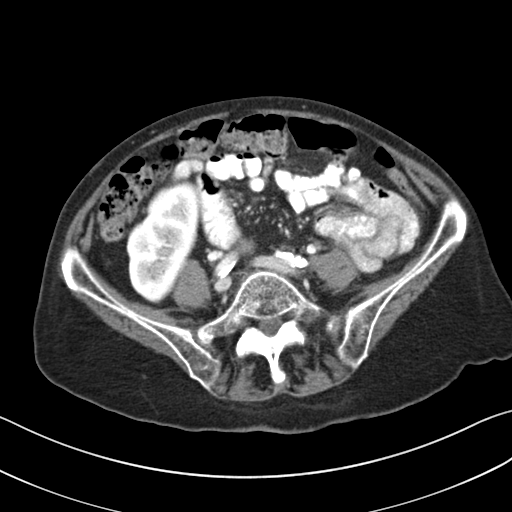
[im 41/69  soft-tissue]
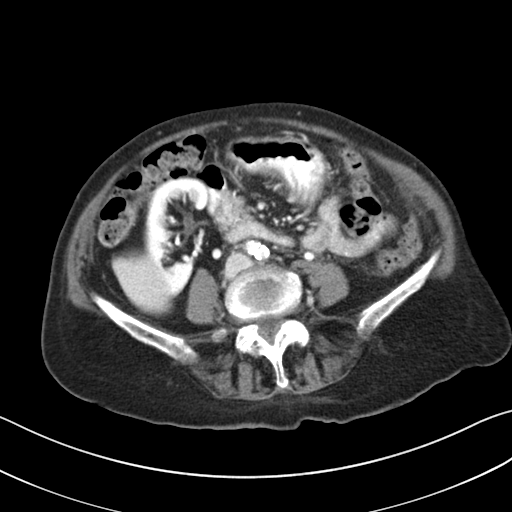
[im 45/69  soft-tissue]
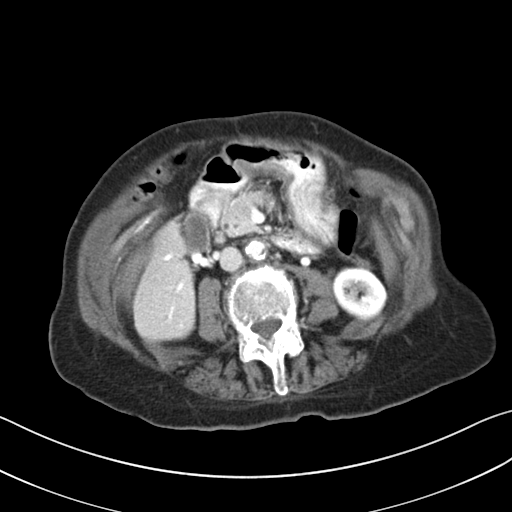
[im 45/69  bone]
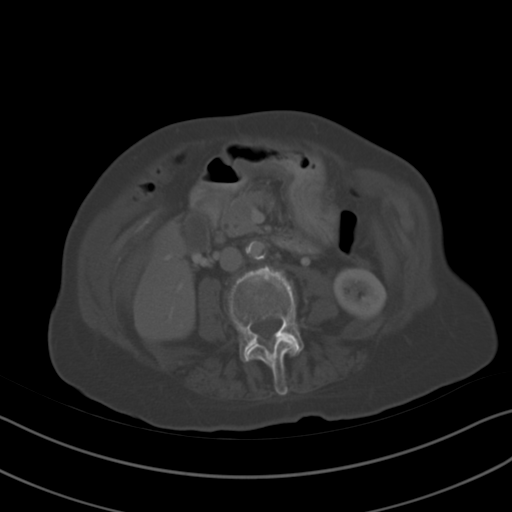
[im 49/69  soft-tissue]
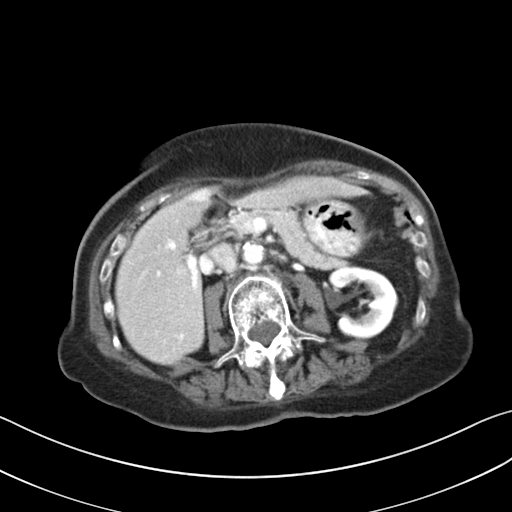
[im 53/69  soft-tissue]
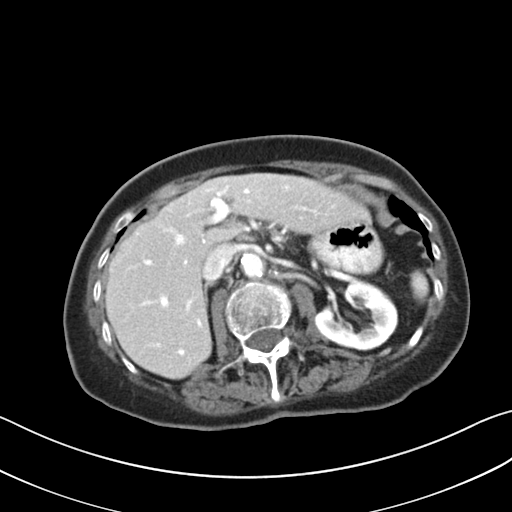
[im 53/69  lung]
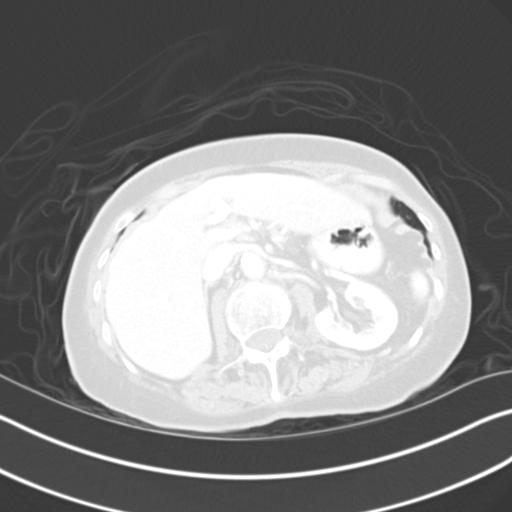
[im 57/69  lung]
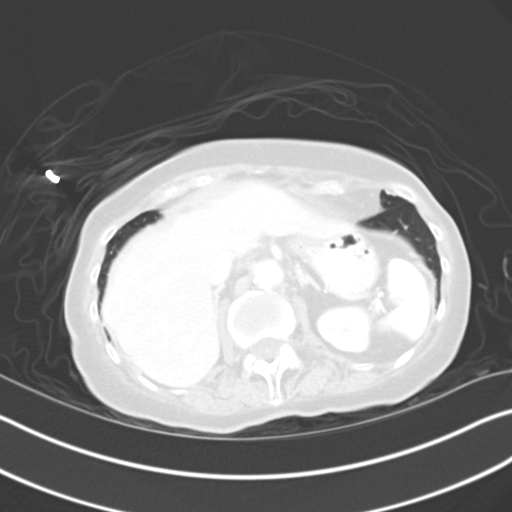
[im 61/69  soft-tissue]
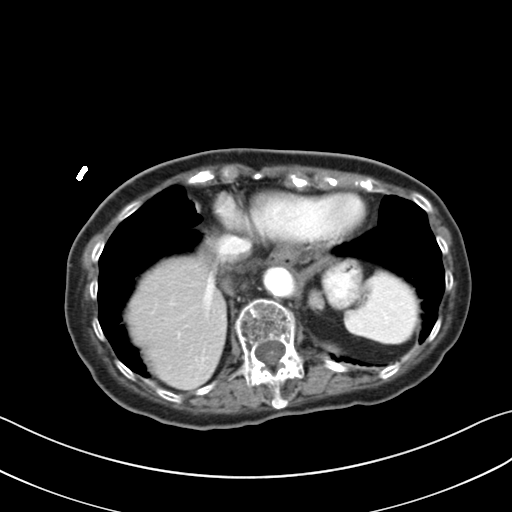
[im 61/69  lung]
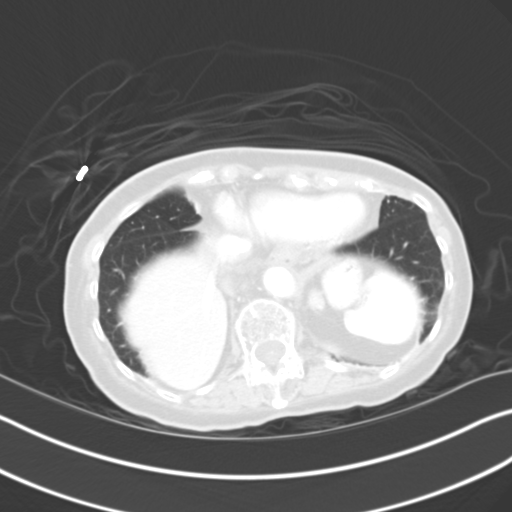
[im 65/69  soft-tissue]
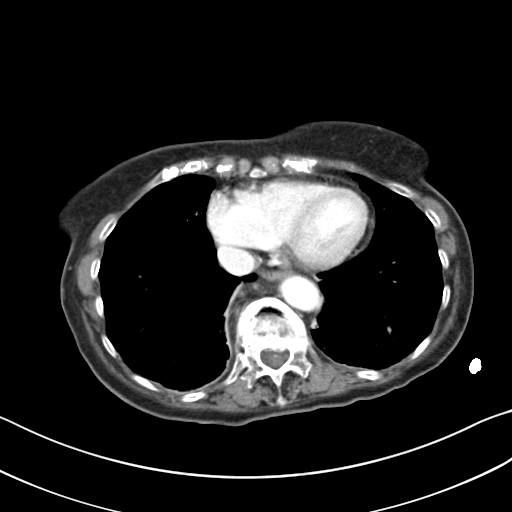
[im 65/69  lung]
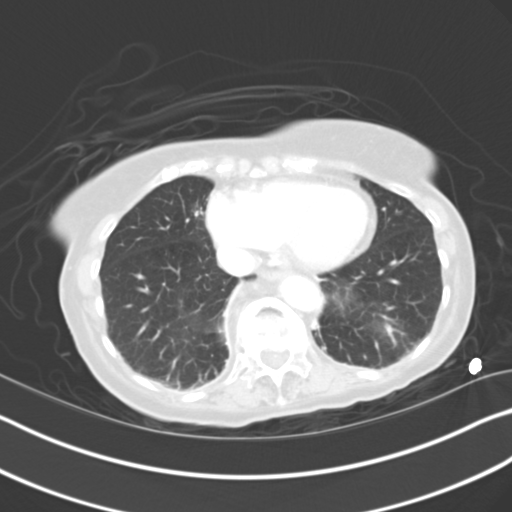

[14 of 32 positions shown; findings below may reference images not displayed]

FINDINGS: The uterus is atrophic. There is a 3.7 x 2.5 cm soft tissue mass in
the right adnexum with adjacent calcifications, unchanged since
07/08/2011 and non FDG avid on PET scan. This is not felt to be
significant. Atrophic left ovary is normal.

Liver, biliary tree, spleen, pancreas, adrenal glands, and kidneys
are normal. The previously demonstrated periaortic, pelvic, and
inguinal adenopathy has completely resolved.

The bladder appears normal. There are some calcifications around the
bladder base which have minimally increased since the prior study.

There is some fluid in the vagina and there is enhancement of the
wall of the vagina, nonspecific.

The patient has compression fractures at T12, L2, L3, and L4, new
since 07/08/2011.
IMPRESSION: 1. Increased enhancement of the vagina with a small amount of fluid
in the vagina. This is nonspecific.
2. Stable right adnexal mass, not felt to be significant. Atrophic
uterus and left ovary.
3. Resolution of adenopathy.
4. Multiple compression fractures in the spine.

## 2017-03-17 ENCOUNTER — Encounter: Payer: Self-pay | Admitting: Family Medicine
